# Patient Record
Sex: Female | Born: 1935 | Race: White | Hispanic: No | Smoking: Never smoker
Health system: Southern US, Community
[De-identification: ages and names within clinical notes are randomized; demographics above are authoritative.]

## PROBLEM LIST (undated history)

## (undated) DIAGNOSIS — I10 Essential (primary) hypertension: Secondary | ICD-10-CM

## (undated) DIAGNOSIS — E785 Hyperlipidemia, unspecified: Secondary | ICD-10-CM

## (undated) DIAGNOSIS — Z8719 Personal history of other diseases of the digestive system: Secondary | ICD-10-CM

## (undated) DIAGNOSIS — F419 Anxiety disorder, unspecified: Secondary | ICD-10-CM

## (undated) DIAGNOSIS — C801 Malignant (primary) neoplasm, unspecified: Secondary | ICD-10-CM

## (undated) HISTORY — DX: Essential (primary) hypertension: I10

## (undated) HISTORY — PX: TOOTH EXTRACTION: SUR596

## (undated) HISTORY — DX: Malignant (primary) neoplasm, unspecified: C80.1

## (undated) HISTORY — DX: Hyperlipidemia, unspecified: E78.5

## (undated) HISTORY — PX: BREAST BIOPSY: SHX20

---

## 1998-10-15 ENCOUNTER — Other Ambulatory Visit: Admission: RE | Admit: 1998-10-15 | Discharge: 1998-10-15 | Payer: Self-pay | Admitting: Family Medicine

## 1999-09-13 ENCOUNTER — Inpatient Hospital Stay (HOSPITAL_COMMUNITY): Admission: EM | Admit: 1999-09-13 | Discharge: 1999-09-17 | Payer: Self-pay | Admitting: Family Medicine

## 1999-09-14 ENCOUNTER — Encounter: Payer: Self-pay | Admitting: Family Medicine

## 2000-02-27 ENCOUNTER — Other Ambulatory Visit: Admission: RE | Admit: 2000-02-27 | Discharge: 2000-02-27 | Payer: Self-pay | Admitting: Family Medicine

## 2001-03-29 ENCOUNTER — Other Ambulatory Visit: Admission: RE | Admit: 2001-03-29 | Discharge: 2001-03-29 | Payer: Self-pay | Admitting: Family Medicine

## 2002-07-17 ENCOUNTER — Other Ambulatory Visit: Admission: RE | Admit: 2002-07-17 | Discharge: 2002-07-17 | Payer: Self-pay | Admitting: Family Medicine

## 2005-02-19 ENCOUNTER — Ambulatory Visit (HOSPITAL_COMMUNITY): Admission: RE | Admit: 2005-02-19 | Discharge: 2005-02-19 | Payer: Self-pay | Admitting: Gastroenterology

## 2006-08-19 ENCOUNTER — Other Ambulatory Visit: Admission: RE | Admit: 2006-08-19 | Discharge: 2006-08-19 | Payer: Self-pay | Admitting: Family Medicine

## 2010-01-07 ENCOUNTER — Encounter: Admission: RE | Admit: 2010-01-07 | Discharge: 2010-01-07 | Payer: Self-pay | Admitting: Family Medicine

## 2010-01-27 ENCOUNTER — Encounter: Admission: RE | Admit: 2010-01-27 | Discharge: 2010-01-27 | Payer: Self-pay | Admitting: Surgery

## 2010-01-29 ENCOUNTER — Ambulatory Visit (HOSPITAL_BASED_OUTPATIENT_CLINIC_OR_DEPARTMENT_OTHER): Admission: RE | Admit: 2010-01-29 | Discharge: 2010-01-29 | Payer: Self-pay | Admitting: Surgery

## 2010-01-29 HISTORY — PX: BREAST LUMPECTOMY: SHX2

## 2010-02-03 ENCOUNTER — Ambulatory Visit: Payer: Self-pay | Admitting: Hematology & Oncology

## 2010-02-14 LAB — CBC WITH DIFFERENTIAL (CANCER CENTER ONLY)
BASO#: 0.1 10*3/uL (ref 0.0–0.2)
BASO%: 1 % (ref 0.0–2.0)
EOS%: 2.1 % (ref 0.0–7.0)
Eosinophils Absolute: 0.2 10*3/uL (ref 0.0–0.5)
HCT: 46 % (ref 34.8–46.6)
HGB: 15.3 g/dL (ref 11.6–15.9)
LYMPH#: 2.8 10*3/uL (ref 0.9–3.3)
LYMPH%: 27.1 % (ref 14.0–48.0)
MCH: 29.5 pg (ref 26.0–34.0)
MCHC: 33.2 g/dL (ref 32.0–36.0)
MCV: 89 fL (ref 81–101)
MONO#: 0.4 10*3/uL (ref 0.1–0.9)
MONO%: 3.9 % (ref 0.0–13.0)
NEUT#: 6.8 10*3/uL — ABNORMAL HIGH (ref 1.5–6.5)
NEUT%: 65.9 % (ref 39.6–80.0)
Platelets: 296 10*3/uL (ref 145–400)
RBC: 5.17 10*6/uL (ref 3.70–5.32)
RDW: 11.9 % (ref 10.5–14.6)
WBC: 10.3 10*3/uL — ABNORMAL HIGH (ref 3.9–10.0)

## 2010-02-14 LAB — COMPREHENSIVE METABOLIC PANEL
ALT: 11 U/L (ref 0–35)
AST: 23 U/L (ref 0–37)
Albumin: 4.6 g/dL (ref 3.5–5.2)
Alkaline Phosphatase: 70 U/L (ref 39–117)
BUN: 11 mg/dL (ref 6–23)
CO2: 26 mEq/L (ref 19–32)
Calcium: 10.1 mg/dL (ref 8.4–10.5)
Chloride: 103 mEq/L (ref 96–112)
Creatinine, Ser: 0.79 mg/dL (ref 0.40–1.20)
Glucose, Bld: 140 mg/dL — ABNORMAL HIGH (ref 70–99)
Potassium: 3.5 mEq/L (ref 3.5–5.3)
Sodium: 141 mEq/L (ref 135–145)
Total Bilirubin: 0.4 mg/dL (ref 0.3–1.2)
Total Protein: 7 g/dL (ref 6.0–8.3)

## 2010-02-14 LAB — VITAMIN D 25 HYDROXY (VIT D DEFICIENCY, FRACTURES): Vit D, 25-Hydroxy: 64 ng/mL (ref 30–89)

## 2010-02-19 ENCOUNTER — Encounter: Admission: RE | Admit: 2010-02-19 | Discharge: 2010-02-19 | Payer: Self-pay | Admitting: Hematology & Oncology

## 2010-03-12 ENCOUNTER — Ambulatory Visit
Admission: RE | Admit: 2010-03-12 | Discharge: 2010-04-23 | Payer: Self-pay | Source: Home / Self Care | Attending: Radiation Oncology | Admitting: Radiation Oncology

## 2010-04-11 ENCOUNTER — Ambulatory Visit: Payer: Self-pay | Admitting: Hematology & Oncology

## 2010-04-14 LAB — COMPREHENSIVE METABOLIC PANEL
ALT: 13 U/L (ref 0–35)
AST: 23 U/L (ref 0–37)
Albumin: 4.7 g/dL (ref 3.5–5.2)
Alkaline Phosphatase: 65 U/L (ref 39–117)
BUN: 15 mg/dL (ref 6–23)
CO2: 29 mEq/L (ref 19–32)
Calcium: 9.9 mg/dL (ref 8.4–10.5)
Chloride: 102 mEq/L (ref 96–112)
Creatinine, Ser: 0.75 mg/dL (ref 0.40–1.20)
Glucose, Bld: 116 mg/dL — ABNORMAL HIGH (ref 70–99)
Potassium: 3.9 mEq/L (ref 3.5–5.3)
Sodium: 142 mEq/L (ref 135–145)
Total Bilirubin: 0.4 mg/dL (ref 0.3–1.2)
Total Protein: 7.1 g/dL (ref 6.0–8.3)

## 2010-04-14 LAB — CBC WITH DIFFERENTIAL (CANCER CENTER ONLY)
BASO#: 0.1 10*3/uL (ref 0.0–0.2)
BASO%: 0.8 % (ref 0.0–2.0)
EOS%: 1.8 % (ref 0.0–7.0)
Eosinophils Absolute: 0.1 10*3/uL (ref 0.0–0.5)
HCT: 44.7 % (ref 34.8–46.6)
HGB: 14.9 g/dL (ref 11.6–15.9)
LYMPH#: 1.3 10*3/uL (ref 0.9–3.3)
LYMPH%: 17.2 % (ref 14.0–48.0)
MCH: 29.4 pg (ref 26.0–34.0)
MCHC: 33.3 g/dL (ref 32.0–36.0)
MCV: 88 fL (ref 81–101)
MONO#: 0.5 10*3/uL (ref 0.1–0.9)
MONO%: 6.3 % (ref 0.0–13.0)
NEUT#: 5.5 10*3/uL (ref 1.5–6.5)
NEUT%: 73.9 % (ref 39.6–80.0)
Platelets: 234 10*3/uL (ref 145–400)
RBC: 5.06 10*6/uL (ref 3.70–5.32)
RDW: 11.4 % (ref 10.5–14.6)
WBC: 7.5 10*3/uL (ref 3.9–10.0)

## 2010-05-16 ENCOUNTER — Ambulatory Visit: Payer: Self-pay | Admitting: Hematology & Oncology

## 2010-07-03 ENCOUNTER — Encounter (HOSPITAL_BASED_OUTPATIENT_CLINIC_OR_DEPARTMENT_OTHER): Payer: Medicare Other | Admitting: Hematology & Oncology

## 2010-07-03 ENCOUNTER — Other Ambulatory Visit: Payer: Self-pay | Admitting: Hematology & Oncology

## 2010-07-03 DIAGNOSIS — Z170 Estrogen receptor positive status [ER+]: Secondary | ICD-10-CM

## 2010-07-03 DIAGNOSIS — I10 Essential (primary) hypertension: Secondary | ICD-10-CM

## 2010-07-03 DIAGNOSIS — C50419 Malignant neoplasm of upper-outer quadrant of unspecified female breast: Secondary | ICD-10-CM

## 2010-07-03 LAB — COMPREHENSIVE METABOLIC PANEL
ALT: 10 U/L (ref 0–35)
AST: 21 U/L (ref 0–37)
Albumin: 4.5 g/dL (ref 3.5–5.2)
Alkaline Phosphatase: 71 U/L (ref 39–117)
BUN: 19 mg/dL (ref 6–23)
CO2: 28 mEq/L (ref 19–32)
Calcium: 10 mg/dL (ref 8.4–10.5)
Chloride: 101 mEq/L (ref 96–112)
Creatinine, Ser: 0.84 mg/dL (ref 0.40–1.20)
Glucose, Bld: 125 mg/dL — ABNORMAL HIGH (ref 70–99)
Potassium: 3.7 mEq/L (ref 3.5–5.3)
Sodium: 142 mEq/L (ref 135–145)
Total Bilirubin: 0.4 mg/dL (ref 0.3–1.2)
Total Protein: 7.1 g/dL (ref 6.0–8.3)

## 2010-07-03 LAB — CBC WITH DIFFERENTIAL (CANCER CENTER ONLY)
BASO#: 0 10*3/uL (ref 0.0–0.2)
BASO%: 0.6 % (ref 0.0–2.0)
EOS%: 1.1 % (ref 0.0–7.0)
Eosinophils Absolute: 0.1 10*3/uL (ref 0.0–0.5)
HCT: 44.6 % (ref 34.8–46.6)
HGB: 15.2 g/dL (ref 11.6–15.9)
LYMPH#: 1.3 10*3/uL (ref 0.9–3.3)
LYMPH%: 21.4 % (ref 14.0–48.0)
MCH: 29.9 pg (ref 26.0–34.0)
MCHC: 34 g/dL (ref 32.0–36.0)
MCV: 88 fL (ref 81–101)
MONO#: 0.4 10*3/uL (ref 0.1–0.9)
MONO%: 6 % (ref 0.0–13.0)
NEUT#: 4.4 10*3/uL (ref 1.5–6.5)
NEUT%: 70.9 % (ref 39.6–80.0)
Platelets: 237 10*3/uL (ref 145–400)
RBC: 5.07 10*6/uL (ref 3.70–5.32)
RDW: 11.4 % (ref 10.5–14.6)
WBC: 6.3 10*3/uL (ref 3.9–10.0)

## 2010-07-10 ENCOUNTER — Other Ambulatory Visit: Payer: Self-pay | Admitting: Dermatology

## 2010-07-24 LAB — COMPREHENSIVE METABOLIC PANEL
ALT: 13 U/L (ref 0–35)
AST: 26 U/L (ref 0–37)
Albumin: 4.2 g/dL (ref 3.5–5.2)
Alkaline Phosphatase: 66 U/L (ref 39–117)
BUN: 9 mg/dL (ref 6–23)
CO2: 28 mEq/L (ref 19–32)
Calcium: 9.4 mg/dL (ref 8.4–10.5)
Chloride: 107 mEq/L (ref 96–112)
Creatinine, Ser: 0.74 mg/dL (ref 0.4–1.2)
GFR calc Af Amer: >60 mL/min (ref >60)
GFR calc non Af Amer: >60 mL/min (ref >60)
Glucose, Bld: 114 mg/dL — ABNORMAL HIGH (ref 70–99)
Potassium: 3.9 mEq/L (ref 3.5–5.1)
Sodium: 141 mEq/L (ref 135–145)
Total Bilirubin: 0.7 mg/dL (ref 0.3–1.2)
Total Protein: 7.4 g/dL (ref 6.0–8.3)

## 2010-07-24 LAB — CBC
HCT: 46.2 % — ABNORMAL HIGH (ref 36.0–46.0)
Hemoglobin: 15.9 g/dL — ABNORMAL HIGH (ref 12.0–15.0)
MCH: 30.5 pg (ref 26.0–34.0)
MCHC: 34.4 g/dL (ref 30.0–36.0)
MCV: 88.7 fL (ref 78.0–100.0)
Platelets: 240 10*3/uL (ref 150–400)
RBC: 5.21 MIL/uL — ABNORMAL HIGH (ref 3.87–5.11)
RDW: 12.8 % (ref 11.5–15.5)
WBC: 8.4 10*3/uL (ref 4.0–10.5)

## 2010-07-24 LAB — URINE MICROSCOPIC-ADD ON

## 2010-07-24 LAB — DIFFERENTIAL
Basophils Absolute: 0.1 10*3/uL (ref 0.0–0.1)
Basophils Relative: 1 % (ref 0–1)
Eosinophils Absolute: 0.1 10*3/uL (ref 0.0–0.7)
Eosinophils Relative: 2 % (ref 0–5)
Lymphocytes Relative: 27 % (ref 12–46)
Lymphs Abs: 2.3 10*3/uL (ref 0.7–4.0)
Monocytes Absolute: 0.6 10*3/uL (ref 0.1–1.0)
Monocytes Relative: 7 % (ref 3–12)
Neutro Abs: 5.4 10*3/uL (ref 1.7–7.7)
Neutrophils Relative %: 64 % (ref 43–77)

## 2010-07-24 LAB — URINALYSIS, ROUTINE W REFLEX MICROSCOPIC
Bilirubin Urine: NEGATIVE
Glucose, UA: NEGATIVE mg/dL
Hgb urine dipstick: NEGATIVE
Ketones, ur: NEGATIVE mg/dL
Nitrite: NEGATIVE
Protein, ur: NEGATIVE mg/dL
Specific Gravity, Urine: 1.009 (ref 1.005–1.030)
Urobilinogen, UA: 0.2 mg/dL (ref 0.0–1.0)
pH: 7 (ref 5.0–8.0)

## 2010-07-31 ENCOUNTER — Encounter (HOSPITAL_BASED_OUTPATIENT_CLINIC_OR_DEPARTMENT_OTHER): Payer: Medicare Other | Admitting: Hematology & Oncology

## 2010-07-31 DIAGNOSIS — C50419 Malignant neoplasm of upper-outer quadrant of unspecified female breast: Secondary | ICD-10-CM

## 2010-07-31 DIAGNOSIS — Z170 Estrogen receptor positive status [ER+]: Secondary | ICD-10-CM

## 2010-09-08 ENCOUNTER — Encounter (INDEPENDENT_AMBULATORY_CARE_PROVIDER_SITE_OTHER): Payer: Self-pay | Admitting: Surgery

## 2010-09-25 ENCOUNTER — Other Ambulatory Visit: Payer: Self-pay | Admitting: Hematology & Oncology

## 2010-09-25 ENCOUNTER — Encounter (HOSPITAL_BASED_OUTPATIENT_CLINIC_OR_DEPARTMENT_OTHER): Payer: Medicare Other | Admitting: Hematology & Oncology

## 2010-09-25 DIAGNOSIS — Z170 Estrogen receptor positive status [ER+]: Secondary | ICD-10-CM

## 2010-09-25 DIAGNOSIS — C50419 Malignant neoplasm of upper-outer quadrant of unspecified female breast: Secondary | ICD-10-CM

## 2010-09-25 DIAGNOSIS — E559 Vitamin D deficiency, unspecified: Secondary | ICD-10-CM

## 2010-09-25 LAB — CBC WITH DIFFERENTIAL (CANCER CENTER ONLY)
BASO#: 0 10*3/uL (ref 0.0–0.2)
BASO%: 0.4 % (ref 0.0–2.0)
EOS%: 1.2 % (ref 0.0–7.0)
Eosinophils Absolute: 0.1 10*3/uL (ref 0.0–0.5)
HCT: 42 % (ref 34.8–46.6)
HGB: 14.3 g/dL (ref 11.6–15.9)
LYMPH#: 1.9 10*3/uL (ref 0.9–3.3)
LYMPH%: 27.6 % (ref 14.0–48.0)
MCH: 29.4 pg (ref 26.0–34.0)
MCHC: 34 g/dL (ref 32.0–36.0)
MCV: 86 fL (ref 81–101)
MONO#: 0.5 10*3/uL (ref 0.1–0.9)
MONO%: 7.4 % (ref 0.0–13.0)
NEUT#: 4.3 10*3/uL (ref 1.5–6.5)
NEUT%: 63.4 % (ref 39.6–80.0)
Platelets: 219 10*3/uL (ref 145–400)
RBC: 4.87 10*6/uL (ref 3.70–5.32)
RDW: 13 % (ref 11.1–15.7)
WBC: 6.8 10*3/uL (ref 3.9–10.0)

## 2010-09-26 LAB — COMPREHENSIVE METABOLIC PANEL
ALT: 10 U/L (ref 0–35)
AST: 23 U/L (ref 0–37)
Albumin: 4.2 g/dL (ref 3.5–5.2)
Alkaline Phosphatase: 57 U/L (ref 39–117)
BUN: 13 mg/dL (ref 6–23)
CO2: 25 mEq/L (ref 19–32)
Calcium: 9.5 mg/dL (ref 8.4–10.5)
Chloride: 104 mEq/L (ref 96–112)
Creatinine, Ser: 0.78 mg/dL (ref 0.40–1.20)
Glucose, Bld: 141 mg/dL — ABNORMAL HIGH (ref 70–99)
Potassium: 3.4 mEq/L — ABNORMAL LOW (ref 3.5–5.3)
Sodium: 141 mEq/L (ref 135–145)
Total Bilirubin: 0.4 mg/dL (ref 0.3–1.2)
Total Protein: 6.8 g/dL (ref 6.0–8.3)

## 2010-09-26 LAB — VITAMIN D 25 HYDROXY (VIT D DEFICIENCY, FRACTURES): Vit D, 25-Hydroxy: 58 ng/mL (ref 30–89)

## 2010-09-26 NOTE — Discharge Summary (Signed)
[place]. [facility]  Patient:    Nancy Bell, Nancy Bell                       MRN: [id number] Attending:  Otilio Connors. Gerri Spore, M.D.                           Discharge Summary  TRANSFER NOTE  Ms. Blasingame is being transferred to the step-down unit in order to better monitor her fluids and cardiovascular status. As of this morning, her blood pressure has dropped into the 80s/50s although she is still alert and oriented, not complaining of shortness of breath or chest pain. Her morning EKG does show some nonspecific ST-T wave changes which could be consistent with some cardiac ischemia. Also, her urine output, as recorded, was only 150 cc through the night; and it is necessary for her to have closer monitoring to be able to follow her fluid status. We will also check cardiac enzymes and re-evaluate her placement in the morning. DD:  09/14/99 TD:  09/14/99 Job: [id number]

## 2010-09-26 NOTE — H&P (Signed)
[place]. [facility]  Patient:    Nancy Bell, Nancy Bell                       MRN: [id number] Attending:  Otilio Connors. Gerri Spore, M.D.                         History and Physical  CHIEF COMPLAINT: High fever, chills, diarrhea, severe malaise for a day.  HISTORY OF PRESENT ILLNESS: This 77 year old white female was well until approximately five days prior to admission when she developed a tender abscess the size of a golf ball on his skin on the left upper quadrant.  She was seen at [facility] and had it incised and drained and then was put on Augmentin.  She seemed to do well until three days prior to admission when she started to develop a slight amount of diarrhea.  The day prior to admission the diarrhea became worse and the day of admission the diarrhea was even worse, so that she stopped the Augmentin the day of admission.  During the day of admission she was well enough initially to go to a farmers market but then developed fever and chills, and was unable to continue walking.  She went home, where she experienced ongoing worsening chills and joint aches, with nausea and anorexia.  She continued to have diarrhea, having 10-12 liquid stools per day.  She presented to the urgent care clinic at [facility] and was evaluated there, found to have a high WBC of 16,900, and no definite source for her fever, with a fever of 103 degrees.  A urinalysis at that time was normal.  She denied at any time any headache, neck stiffness or soreness, any confusion, or other neurologic symptoms.  She denied any shortness of breath, chest pain, recent cough or congestion, denied any urinary symptoms, any abdominal pain initially, no vaginal discharge, and had not seen any blood in her stool.  She had not had any swelling of her extremities.  Just prior to coming going to the urgent care clinic, however, she did have a small amount of right lower quadrant pain  which she refers to as a "twinge" and was found to be slightly tender in that area when seen in the walk-in clinic.  PAST MEDICAL HISTORY: Negative for any significant illness.  She has never been hospitalized for anything except childbirth.  She does have some osteopenia for which she takes Lamictal and she takes one aspirin a day.  She has had some white coat hypertension but it is normal at home.  PAST SURGICAL HISTORY: She has never had any surgeries.  SOCIAL HISTORY: [place] is active.  [place]  She has dogs that come in the house and she could be exposed to ticks but did not have any known tick bites.  She does not smoke and does not drink alcohol.  Her husband recently had open heart surgery and has been very ill with lymphoma.  FAMILY HISTORY: Negative for any significant illness except for cancer, but not for colon or breast cancer.  REVIEW OF SYSTEMS: Negative for any visual symptoms or other neurologic complaints.  She does complain of a dry mouth.  She denies any shortness of breath, chest pain, palpitations, or syncope.  She denies any change in bowel habits prior to the onset of the diarrhea or any blood in her stool.  She  denies any rash.  PHYSICAL EXAMINATION:  VITAL SIGNS: Temperature 102 degrees, heart rate 112, respirations 76, blood pressure 144/85.  Weight is 122 pounds.  GENERAL: She is a tired-appearing but alert and normal appearing slim elderly female.  HEENT: Sclerae anicteric.  Conjunctivae pink.  PERRL.  TMs are without effusions.  Oropharynx shows dry mucous membranes.  NECK: Supple without adenopathy or thyromegaly.  LUNGS: Clear.  CARDIAC: Regular rate and rhythm, tachycardic.  No murmur heard.  BREAST: Without masses, nontender.  ABDOMEN: Soft, slightly tender to deep palpation in the right lower quadrant but no guarding or rebound, and no masses.  She has a healing wound which is dry and is not  draining, and has no more inflammation in the left upper quadrant just below the ribs.  She has no inguinal adenopathy.  EXTREMITIES: Her lower extremities are without any rash, clubbing, cyanosis, or edema.  NEUROLOGIC: Cranial nerves 2-12 intact.  Motor and sensory grossly intact, nonfocal.  LABORATORY DATA: Laboratories from the walk-in clinic show a WBC of 16,900, hemoglobin normal at 14.5 and hematocrit normal at 42.8; platelets normal at 253,000.  Urinalysis negative except for ketones.  ASSESSMENT/PLAN:  1. Acute onset high fever prostration, leukocytosis.  Rule out sepsis.  At     this point no source for her fever.  Plan is for cultures of urine, blood,     and stool, chest x-ray, abdominal CT to rule out abdominal abscess or     appendicitis.  Will also check a [facility] Spotted Fever titer and     start on IV antibiotics, broad spectrum, with coverage also for [facility] Spotted Fever.  2. Dehydration, diarrhea.  Give IV fluids, follow electrolytes carefully,     culture stool, and check for Clostridium difficile toxin as well. DD:  09/13/99 TD:  09/14/99 Job: [id number]

## 2010-09-26 NOTE — Op Note (Signed)
Nancy Bell, RITACCO NO.:  [id number]   MEDICAL RECORD NO.:  [id number]          PATIENT TYPE:  AMB   LOCATION:  ENDO                         FACILITY:  [facility]   PHYSICIAN:  Bernette Redbird, M.D.   DATE OF BIRTH:  17-Jan-1925   DATE OF PROCEDURE:  02/19/2005  DATE OF DISCHARGE:                                 OPERATIVE REPORT   PROCEDURE:  Colonoscopy.   INDICATION:  Initial screening colonoscopy in an asymptomatic 77 year old  female.   FINDINGS:  Normal exam.   DESCRIPTION OF PROCEDURE:  The nature, purpose, and risks of the procedure  had been reviewed with the patient both through our open access program and  also immediately prior to the procedure by myself and she provided written  consent. Sedation was fentanyl 50 mcg and Versed 5 mg IV without arrhythmias  or desaturation. The Olympus adjustable tension pediatric video colonoscope  was advanced to the terminal ileum which had a normal appearance and  pullback was then performed. The appendiceal orifice was readily identified  as well. The quality of prep was excellent and it is felt that all areas  were well seen.   This was a normal examination. No polyps, cancer, colitis, vascular  malformations or diverticulosis were noted. Retroflexion in the rectum was  normal. No biopsies were obtained. The patient tolerated the procedure well  and there were no apparent complications.   IMPRESSION:  Normal screening colonoscopy.   PLAN:  The interval to the patient's next colonoscopy would depend on her  family history. There is a possible family history of colon polyps in her  sister, in which case a repeat colonoscopy in 5 years would probably be  clinically appropriate, presuming the patient remains in good general  medical health in the interim. If there is no family history of colonic  adenomas, since this exam was negative and the patient is 77 years all and  would next be due for a screening colonoscopy  at age 77 (10 years from now),  realistically further routine colonoscopic screening would not be needed.  From talking with the patient today, apparently it is still not clear if  this recently-diagnosed tumor on her sisters colon was neoplastic or not,  so depending on how those detail shape up over time, we will make our  decision accordingly.           ______________________________  Bernette Redbird, M.D.     RB/MEDQ  D:  02/19/2005  T:  02/19/2005  Job:  [id number]   cc:   Jethro Bastos, M.D.  Fax: [phone]

## 2010-12-22 ENCOUNTER — Ambulatory Visit
Admission: RE | Admit: 2010-12-22 | Discharge: 2010-12-22 | Disposition: A | Discharge status: Home / Self Care | Payer: Medicare Other | Source: Ambulatory Visit | Attending: Radiation Oncology | Admitting: Radiation Oncology

## 2011-01-23 ENCOUNTER — Encounter (INDEPENDENT_AMBULATORY_CARE_PROVIDER_SITE_OTHER): Payer: Self-pay | Admitting: General Surgery

## 2011-01-23 DIAGNOSIS — Z853 Personal history of malignant neoplasm of breast: Secondary | ICD-10-CM | POA: Insufficient documentation

## 2011-01-25 ENCOUNTER — Encounter (INDEPENDENT_AMBULATORY_CARE_PROVIDER_SITE_OTHER): Payer: Self-pay | Admitting: Surgery

## 2011-01-27 ENCOUNTER — Ambulatory Visit (INDEPENDENT_AMBULATORY_CARE_PROVIDER_SITE_OTHER): Payer: Medicare Other | Admitting: Surgery

## 2011-01-27 ENCOUNTER — Encounter (INDEPENDENT_AMBULATORY_CARE_PROVIDER_SITE_OTHER): Payer: Self-pay | Admitting: Surgery

## 2011-01-27 DIAGNOSIS — Z853 Personal history of malignant neoplasm of breast: Secondary | ICD-10-CM

## 2011-01-27 NOTE — Progress Notes (Signed)
NAME: Nancy Bell       DOB: April 19, 1925           DATE: 01/27/2011       MRN: [id number]   Nancy Bell is a 77 y.o.Marland Kitchenfemale who presents for routine followup of her Right breast cancerdiagnosed in 2011 and treated with lumpectomy and radiation. She was not able to tolerate anti-estrogen therapy. She has no problems or concerns on either side.  PFSH: She has had no significant changes since the last visit here.  ROS: There have been no significant changes since the last visit here  EXAM: General: The patient is alert, oriented, generally healty appearing, NAD. Mood and affect are normal.  Breasts:  The right breast status post lumpectomy with radiation therapy. There are minimal radiation changes. Polypectomy site itself this fine and there is no thickening. This doesn't suggest a recurrence. There's no other modality in that breast in the left breast remains normal to inspection and palpation  Lymphatics: She has no axillary or supraclavicular adenopathy on either side.  Extremities: Full ROM of the surgical side with no lymphedema noted.  Data Reviewed: Recent mammogram report was noted. Also look over the oncology notes.  Impression: Doing well, with no evidence of recurrent cancer or new cancer  Plan: Will continue to follow up on an annual basis here.

## 2011-01-28 ENCOUNTER — Other Ambulatory Visit: Payer: Self-pay | Admitting: Hematology & Oncology

## 2011-01-28 ENCOUNTER — Encounter (HOSPITAL_BASED_OUTPATIENT_CLINIC_OR_DEPARTMENT_OTHER): Payer: Medicare Other | Admitting: Hematology & Oncology

## 2011-01-28 DIAGNOSIS — Z170 Estrogen receptor positive status [ER+]: Secondary | ICD-10-CM

## 2011-01-28 DIAGNOSIS — C50419 Malignant neoplasm of upper-outer quadrant of unspecified female breast: Secondary | ICD-10-CM

## 2011-01-28 DIAGNOSIS — E559 Vitamin D deficiency, unspecified: Secondary | ICD-10-CM

## 2011-01-28 LAB — CBC WITH DIFFERENTIAL (CANCER CENTER ONLY)
BASO#: 0 10*3/uL (ref 0.0–0.2)
BASO%: 0.4 % (ref 0.0–2.0)
EOS%: 3.1 % (ref 0.0–7.0)
Eosinophils Absolute: 0.2 10*3/uL (ref 0.0–0.5)
HCT: 42 % (ref 34.8–46.6)
HGB: 14.7 g/dL (ref 11.6–15.9)
LYMPH#: 1.8 10*3/uL (ref 0.9–3.3)
LYMPH%: 24.4 % (ref 14.0–48.0)
MCH: 30.1 pg (ref 26.0–34.0)
MCHC: 35 g/dL (ref 32.0–36.0)
MCV: 86 fL (ref 81–101)
MONO#: 0.7 10*3/uL (ref 0.1–0.9)
MONO%: 9.3 % (ref 0.0–13.0)
NEUT#: 4.7 10*3/uL (ref 1.5–6.5)
NEUT%: 62.8 % (ref 39.6–80.0)
Platelets: 234 10*3/uL (ref 145–400)
RBC: 4.89 10*6/uL (ref 3.70–5.32)
RDW: 12.6 % (ref 11.1–15.7)
WBC: 7.4 10*3/uL (ref 3.9–10.0)

## 2011-01-29 LAB — COMPREHENSIVE METABOLIC PANEL
ALT: 14 U/L (ref 0–35)
AST: 26 U/L (ref 0–37)
Albumin: 4.2 g/dL (ref 3.5–5.2)
Alkaline Phosphatase: 78 U/L (ref 39–117)
BUN: 13 mg/dL (ref 6–23)
CO2: 29 mEq/L (ref 19–32)
Calcium: 9.6 mg/dL (ref 8.4–10.5)
Chloride: 101 mEq/L (ref 96–112)
Creatinine, Ser: 0.77 mg/dL (ref 0.50–1.10)
Glucose, Bld: 130 mg/dL — ABNORMAL HIGH (ref 70–99)
Potassium: 3.5 mEq/L (ref 3.5–5.3)
Sodium: 142 mEq/L (ref 135–145)
Total Bilirubin: 0.5 mg/dL (ref 0.3–1.2)
Total Protein: 6.6 g/dL (ref 6.0–8.3)

## 2011-01-29 LAB — VITAMIN D 25 HYDROXY (VIT D DEFICIENCY, FRACTURES): Vit D, 25-Hydroxy: 60 ng/mL (ref 30–89)

## 2011-01-29 LAB — LACTATE DEHYDROGENASE: LDH: 164 U/L (ref 94–250)

## 2011-03-11 ENCOUNTER — Encounter (INDEPENDENT_AMBULATORY_CARE_PROVIDER_SITE_OTHER): Payer: Self-pay

## 2011-06-03 ENCOUNTER — Ambulatory Visit (HOSPITAL_BASED_OUTPATIENT_CLINIC_OR_DEPARTMENT_OTHER): Payer: Medicare Other | Admitting: Hematology & Oncology

## 2011-06-03 ENCOUNTER — Other Ambulatory Visit (HOSPITAL_BASED_OUTPATIENT_CLINIC_OR_DEPARTMENT_OTHER): Payer: Medicare Other | Admitting: Lab

## 2011-06-03 VITALS — BP 141/77 | HR 82 | Temp 98.2°F | Ht 61.0 in | Wt 115.0 lb

## 2011-06-03 DIAGNOSIS — Z170 Estrogen receptor positive status [ER+]: Secondary | ICD-10-CM

## 2011-06-03 DIAGNOSIS — E559 Vitamin D deficiency, unspecified: Secondary | ICD-10-CM | POA: Diagnosis not present

## 2011-06-03 DIAGNOSIS — C50119 Malignant neoplasm of central portion of unspecified female breast: Secondary | ICD-10-CM | POA: Diagnosis not present

## 2011-06-03 DIAGNOSIS — C50919 Malignant neoplasm of unspecified site of unspecified female breast: Secondary | ICD-10-CM

## 2011-06-03 DIAGNOSIS — N6089 Other benign mammary dysplasias of unspecified breast: Secondary | ICD-10-CM | POA: Diagnosis not present

## 2011-06-03 DIAGNOSIS — C50419 Malignant neoplasm of upper-outer quadrant of unspecified female breast: Secondary | ICD-10-CM | POA: Diagnosis not present

## 2011-06-03 LAB — CBC WITH DIFFERENTIAL (CANCER CENTER ONLY)
BASO#: 0 10*3/uL (ref 0.0–0.2)
BASO%: 0.4 % (ref 0.0–2.0)
EOS%: 2.7 % (ref 0.0–7.0)
Eosinophils Absolute: 0.2 10*3/uL (ref 0.0–0.5)
HCT: 43.2 % (ref 34.8–46.6)
HGB: 14.5 g/dL (ref 11.6–15.9)
LYMPH#: 1.8 10*3/uL (ref 0.9–3.3)
LYMPH%: 26.2 % (ref 14.0–48.0)
MCH: 29.2 pg (ref 26.0–34.0)
MCHC: 33.6 g/dL (ref 32.0–36.0)
MCV: 87 fL (ref 81–101)
MONO#: 0.5 10*3/uL (ref 0.1–0.9)
MONO%: 7.2 % (ref 0.0–13.0)
NEUT#: 4.3 10*3/uL (ref 1.5–6.5)
NEUT%: 63.5 % (ref 39.6–80.0)
Platelets: 218 10*3/uL (ref 145–400)
RBC: 4.96 10*6/uL (ref 3.70–5.32)
RDW: 13 % (ref 11.1–15.7)
WBC: 6.8 10*3/uL (ref 3.9–10.0)

## 2011-06-03 NOTE — Progress Notes (Signed)
This office note has been dictated.

## 2011-06-03 NOTE — Progress Notes (Signed)
CC:   Bailey Mech, MD Currie Paris, M.D.  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  CURRENT THERAPY:  Observation.  INTERIM HISTORY:  Ms. Weesner comes in for followup.  She is doing well. We last saw her back in September.  She got through the holidays without any difficulties.  She did have a tough time back in the fall.  Her sister had died during the summer.  Thankfully, Ms. Garramone's family are all very strong and a very close family.  She has had no problems with arthritis or myalgias.  She has had no change in bowel or bladder habits.  She has had no cough or shortness of breath.  She is intolerant to aromatase inhibitors.  We also tried her on Fareston, with which she did not do well at all.  She has had no fever.  There has been no bleeding.  She has had no rashes.  PHYSICAL EXAMINATION:  General:  This is a well-developed, well- nourished white female in no obvious distress.  Vital Signs:  98.2, pulse 82, respiratory rate 14, blood pressure 141/77.  Weight is 115. Head and Neck Exam:  Shows a normocephalic, atraumatic skull.  There are no ocular or oral lesions.  There are no palpable cervical or supraclavicular lymph nodes.  Thyroid is not palpable.  Lungs:  Clear bilaterally.  Cardiac Exam:  Regular rate and rhythm with normal S1 and S2.  There are no murmurs, rubs or bruits.  Abdominal Exam:  Soft with good bowel sounds.  There is no palpable abdominal mass.  There is no fluid wave.  There is no palpable hepatosplenomegaly.  She does have a sebaceous cyst in the left upper quadrant.  Breast Exam:  Shows the left breast with no masses, edema or erythema.  There is no left axillary adenopathy.  Right breast shows a well-healed lumpectomy at the 3 o'clock position.  There is a slight contraction of the right breast. There is no right axillary adenopathy.  No distinct mass is noted in the right breast.   Back Exam:  Shows no kyphosis nor osteoporotic changes. There is no tenderness over the spine, ribs or hips.  Extremities:  Show no clubbing, cyanosis, or edema.  She has some age-related osteoarthritic changes in her joints.  Neurological Exam:  Shows no focal neurological deficits.  She does have a little bit of a tremor.  LABORATORY STUDIES:  White cell count 6.8, hemoglobin 14.5, hematocrit 43.2, platelet count 218.  IMPRESSION:  Ms. Brouhard is an 78 year old white female with stage I infiltrating ductal carcinoma of the right breast.  She underwent lumpectomy, I think, back in September 2011.  For now, I do not see any problems with recurrent disease.  We will get her back in 4 months.  I think if all is good in 4 months, then we can hopefully get her back every 6 months after that.    ______________________________ Josph Macho, M.D. PRE/MEDQ  D:  06/03/2011  T:  06/03/2011  Job:  [id number]

## 2011-06-04 LAB — COMPREHENSIVE METABOLIC PANEL
ALT: 12 U/L (ref 0–35)
AST: 23 U/L (ref 0–37)
Albumin: 4.4 g/dL (ref 3.5–5.2)
Alkaline Phosphatase: 64 U/L (ref 39–117)
BUN: 17 mg/dL (ref 6–23)
CO2: 27 mEq/L (ref 19–32)
Calcium: 9.7 mg/dL (ref 8.4–10.5)
Chloride: 101 mEq/L (ref 96–112)
Creatinine, Ser: 0.77 mg/dL (ref 0.50–1.10)
Glucose, Bld: 109 mg/dL — ABNORMAL HIGH (ref 70–99)
Potassium: 3.6 mEq/L (ref 3.5–5.3)
Sodium: 139 mEq/L (ref 135–145)
Total Bilirubin: 0.5 mg/dL (ref 0.3–1.2)
Total Protein: 6.5 g/dL (ref 6.0–8.3)

## 2011-06-04 LAB — VITAMIN D 25 HYDROXY (VIT D DEFICIENCY, FRACTURES): Vit D, 25-Hydroxy: 65 ng/mL (ref 30–89)

## 2011-06-22 ENCOUNTER — Telehealth: Payer: Self-pay | Admitting: *Deleted

## 2011-06-22 NOTE — Telephone Encounter (Signed)
Message copied by Anselm Jungling on Mon Jun 22, 2011  2:39 PM ------      Message from: Arlan Organ R      Created: Thu Jun 18, 2011  6:51 PM       Labs are great!! pete

## 2011-06-22 NOTE — Telephone Encounter (Signed)
Called patient to let her know that her labwork was good per dr. ennever 

## 2011-07-27 ENCOUNTER — Other Ambulatory Visit: Payer: Self-pay | Admitting: Dermatology

## 2011-07-27 DIAGNOSIS — Z85828 Personal history of other malignant neoplasm of skin: Secondary | ICD-10-CM | POA: Diagnosis not present

## 2011-07-27 DIAGNOSIS — L821 Other seborrheic keratosis: Secondary | ICD-10-CM | POA: Diagnosis not present

## 2011-07-27 DIAGNOSIS — D239 Other benign neoplasm of skin, unspecified: Secondary | ICD-10-CM | POA: Diagnosis not present

## 2011-07-27 DIAGNOSIS — C44529 Squamous cell carcinoma of skin of other part of trunk: Secondary | ICD-10-CM | POA: Diagnosis not present

## 2011-07-27 DIAGNOSIS — L578 Other skin changes due to chronic exposure to nonionizing radiation: Secondary | ICD-10-CM | POA: Diagnosis not present

## 2011-07-27 DIAGNOSIS — C44721 Squamous cell carcinoma of skin of unspecified lower limb, including hip: Secondary | ICD-10-CM | POA: Diagnosis not present

## 2011-07-27 DIAGNOSIS — L570 Actinic keratosis: Secondary | ICD-10-CM | POA: Diagnosis not present

## 2011-07-27 DIAGNOSIS — D0460 Carcinoma in situ of skin of unspecified upper limb, including shoulder: Secondary | ICD-10-CM | POA: Diagnosis not present

## 2011-08-24 DIAGNOSIS — C44621 Squamous cell carcinoma of skin of unspecified upper limb, including shoulder: Secondary | ICD-10-CM | POA: Diagnosis not present

## 2011-08-24 DIAGNOSIS — C44721 Squamous cell carcinoma of skin of unspecified lower limb, including hip: Secondary | ICD-10-CM | POA: Diagnosis not present

## 2011-08-24 DIAGNOSIS — L570 Actinic keratosis: Secondary | ICD-10-CM | POA: Diagnosis not present

## 2011-09-30 ENCOUNTER — Other Ambulatory Visit (HOSPITAL_BASED_OUTPATIENT_CLINIC_OR_DEPARTMENT_OTHER): Payer: Medicare Other | Admitting: Lab

## 2011-09-30 ENCOUNTER — Ambulatory Visit (HOSPITAL_BASED_OUTPATIENT_CLINIC_OR_DEPARTMENT_OTHER): Payer: Medicare Other | Admitting: Hematology & Oncology

## 2011-09-30 VITALS — BP 155/70 | HR 91 | Temp 97.4°F | Ht 61.0 in | Wt 114.0 lb

## 2011-09-30 DIAGNOSIS — M818 Other osteoporosis without current pathological fracture: Secondary | ICD-10-CM

## 2011-09-30 DIAGNOSIS — C50219 Malignant neoplasm of upper-inner quadrant of unspecified female breast: Secondary | ICD-10-CM

## 2011-09-30 DIAGNOSIS — Z170 Estrogen receptor positive status [ER+]: Secondary | ICD-10-CM

## 2011-09-30 DIAGNOSIS — C50919 Malignant neoplasm of unspecified site of unspecified female breast: Secondary | ICD-10-CM

## 2011-09-30 DIAGNOSIS — T386X5A Adverse effect of antigonadotrophins, antiestrogens, antiandrogens, not elsewhere classified, initial encounter: Secondary | ICD-10-CM

## 2011-09-30 LAB — COMPREHENSIVE METABOLIC PANEL
ALT: 10 U/L (ref 0–35)
AST: 23 U/L (ref 0–37)
Albumin: 4.5 g/dL (ref 3.5–5.2)
Alkaline Phosphatase: 53 U/L (ref 39–117)
BUN: 18 mg/dL (ref 6–23)
CO2: 28 mEq/L (ref 19–32)
Calcium: 10 mg/dL (ref 8.4–10.5)
Chloride: 103 mEq/L (ref 96–112)
Creatinine, Ser: 0.83 mg/dL (ref 0.50–1.10)
Glucose, Bld: 110 mg/dL — ABNORMAL HIGH (ref 70–99)
Potassium: 3.4 mEq/L — ABNORMAL LOW (ref 3.5–5.3)
Sodium: 142 mEq/L (ref 135–145)
Total Bilirubin: 0.5 mg/dL (ref 0.3–1.2)
Total Protein: 6.7 g/dL (ref 6.0–8.3)

## 2011-09-30 LAB — CBC WITH DIFFERENTIAL (CANCER CENTER ONLY)
BASO#: 0 10*3/uL (ref 0.0–0.2)
BASO%: 0.4 % (ref 0.0–2.0)
EOS%: 1.9 % (ref 0.0–7.0)
Eosinophils Absolute: 0.1 10*3/uL (ref 0.0–0.5)
HCT: 43.6 % (ref 34.8–46.6)
HGB: 14.7 g/dL (ref 11.6–15.9)
LYMPH#: 2.1 10*3/uL (ref 0.9–3.3)
LYMPH%: 28.3 % (ref 14.0–48.0)
MCH: 29.8 pg (ref 26.0–34.0)
MCHC: 33.7 g/dL (ref 32.0–36.0)
MCV: 88 fL (ref 81–101)
MONO#: 0.6 10*3/uL (ref 0.1–0.9)
MONO%: 7.6 % (ref 0.0–13.0)
NEUT#: 4.5 10*3/uL (ref 1.5–6.5)
NEUT%: 61.8 % (ref 39.6–80.0)
Platelets: 228 10*3/uL (ref 145–400)
RBC: 4.93 10*6/uL (ref 3.70–5.32)
RDW: 13 % (ref 11.1–15.7)
WBC: 7.2 10*3/uL (ref 3.9–10.0)

## 2011-09-30 NOTE — Progress Notes (Signed)
This office note has been dictated.

## 2011-09-30 NOTE — Progress Notes (Signed)
CC:   Bailey Mech, MD Currie Paris, M.D.  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  CURRENT THERAPY:  Observation.  INTERIM HISTORY:  Ms. Loughridge comes in for her followup.  We see her every 4-6 months.  She is doing quite well.  She had no complaints since we last saw her.  It has now been almost 2 years since she had her lumpectomy.  She is not tolerant of aromatase inhibitors or tamoxifen.  She has had no bony pain.  She has had no change in bowel or bladder habits.  She is taking vitamin D twice a day.  Her last vitamin D level was 65 back in January.  She has had no fever, sweats or chills.  She has had no bleeding or bruising.  There has been no nausea, vomiting.  PHYSICAL EXAMINATION:  This is a well-developed well-nourished white female in no obvious distress.  Vital signs:  Temperature of 97.4, pulse 91, respiratory rate 18, blood pressure 155/70.  Weight is 114.  Head and neck:  Normocephalic, atraumatic skull.  There are no ocular or oral lesions.  There are no palpable cervical or supraclavicular lymph nodes. Lungs:  Clear to percussion and auscultation bilaterally.  Cardiac: Regular rate and rhythm with normal S1 and S2.  There are no murmurs, rubs or bruits.  Abdomen:  Soft with good bowel sounds.  There is no palpable abdominal mass.  There is no palpable hepatosplenomegaly. Breasts:  Left breast with no masses, edema or erythema.  There is no left axillary adenopathy.  Right breast shows well-healed lumpectomy. This is at the 3 o'clock position.  There is some slight contraction of the lumpectomy site.  There is no obvious mass in the right breast. There is no right axillary adenopathy.  Extremities:  Shows no clubbing, cyanosis or edema.  Neurologic:  No focal neurological deficits.  Skin: No rashes, ecchymosis or petechia.  She does have some actinic keratoses.  She has a couple seborrheic keratosis.   Neurologic:  Shows no focal neurological deficits.  LABORATORY STUDIES:  White cell count is 7.2, hemoglobin 14.7, hematocrit 43.6, platelet count 228.  IMPRESSION:  Ms. Kreutzer is very nice 78 year old white female with stage IB ductal carcinoma of the right breast.  She underwent lumpectomy.  She is not tolerant of aromatase inhibitors or tamoxifen.  I think that we are going to be okay not to have her on any kind of adjuvant hormonal therapy.  I think her risk of recurrence is less than 10%.  We will plan to get her back after labor day now.  We will plan for 4 months.  Once she gets through this year, then we will go every 6 months.    ______________________________ Josph Macho, M.D. PRE/MEDQ  D:  09/30/2011  T:  09/30/2011  Job:  [id number]

## 2011-10-01 ENCOUNTER — Telehealth: Payer: Self-pay | Admitting: *Deleted

## 2011-10-01 NOTE — Telephone Encounter (Signed)
Called patient to let her know that her labwork looked wonderful per dr. Myna Hidalgo

## 2011-10-01 NOTE — Telephone Encounter (Signed)
Message copied by Anselm Jungling on Thu Oct 01, 2011 10:18 AM ------      Message from: Arlan Organ R      Created: Thu Oct 01, 2011  7:25 AM       Please call the patient couldn't tell her that her labs look wonderful. Cindee Lame

## 2011-10-21 DIAGNOSIS — M549 Dorsalgia, unspecified: Secondary | ICD-10-CM | POA: Diagnosis not present

## 2011-11-20 ENCOUNTER — Encounter (INDEPENDENT_AMBULATORY_CARE_PROVIDER_SITE_OTHER): Payer: Self-pay | Admitting: Surgery

## 2012-01-07 DIAGNOSIS — Z853 Personal history of malignant neoplasm of breast: Secondary | ICD-10-CM | POA: Diagnosis not present

## 2012-01-20 DIAGNOSIS — M549 Dorsalgia, unspecified: Secondary | ICD-10-CM | POA: Diagnosis not present

## 2012-01-27 DIAGNOSIS — M549 Dorsalgia, unspecified: Secondary | ICD-10-CM | POA: Diagnosis not present

## 2012-01-27 DIAGNOSIS — Z1331 Encounter for screening for depression: Secondary | ICD-10-CM | POA: Diagnosis not present

## 2012-01-27 DIAGNOSIS — Z23 Encounter for immunization: Secondary | ICD-10-CM | POA: Diagnosis not present

## 2012-01-27 DIAGNOSIS — I10 Essential (primary) hypertension: Secondary | ICD-10-CM | POA: Diagnosis not present

## 2012-01-28 ENCOUNTER — Other Ambulatory Visit (HOSPITAL_BASED_OUTPATIENT_CLINIC_OR_DEPARTMENT_OTHER): Payer: Medicare Other | Admitting: Lab

## 2012-01-28 ENCOUNTER — Ambulatory Visit (HOSPITAL_BASED_OUTPATIENT_CLINIC_OR_DEPARTMENT_OTHER): Payer: Medicare Other | Admitting: Hematology & Oncology

## 2012-01-28 VITALS — BP 150/69 | HR 85 | Temp 97.4°F | Resp 18 | Ht 61.0 in | Wt 118.0 lb

## 2012-01-28 DIAGNOSIS — M818 Other osteoporosis without current pathological fracture: Secondary | ICD-10-CM | POA: Diagnosis not present

## 2012-01-28 DIAGNOSIS — C50319 Malignant neoplasm of lower-inner quadrant of unspecified female breast: Secondary | ICD-10-CM | POA: Diagnosis not present

## 2012-01-28 DIAGNOSIS — C50219 Malignant neoplasm of upper-inner quadrant of unspecified female breast: Secondary | ICD-10-CM | POA: Diagnosis not present

## 2012-01-28 DIAGNOSIS — C50919 Malignant neoplasm of unspecified site of unspecified female breast: Secondary | ICD-10-CM

## 2012-01-28 DIAGNOSIS — M810 Age-related osteoporosis without current pathological fracture: Secondary | ICD-10-CM | POA: Diagnosis not present

## 2012-01-28 DIAGNOSIS — T38805A Adverse effect of unspecified hormones and synthetic substitutes, initial encounter: Secondary | ICD-10-CM | POA: Diagnosis not present

## 2012-01-28 DIAGNOSIS — E559 Vitamin D deficiency, unspecified: Secondary | ICD-10-CM | POA: Diagnosis not present

## 2012-01-28 LAB — CBC WITH DIFFERENTIAL (CANCER CENTER ONLY)
BASO#: 0 10*3/uL (ref 0.0–0.2)
BASO%: 0.4 % (ref 0.0–2.0)
EOS%: 3.5 % (ref 0.0–7.0)
Eosinophils Absolute: 0.3 10*3/uL (ref 0.0–0.5)
HCT: 43.6 % (ref 34.8–46.6)
HGB: 14.9 g/dL (ref 11.6–15.9)
LYMPH#: 1.7 10*3/uL (ref 0.9–3.3)
LYMPH%: 24.7 % (ref 14.0–48.0)
MCH: 30 pg (ref 26.0–34.0)
MCHC: 34.2 g/dL (ref 32.0–36.0)
MCV: 88 fL (ref 81–101)
MONO#: 0.6 10*3/uL (ref 0.1–0.9)
MONO%: 7.9 % (ref 0.0–13.0)
NEUT#: 4.5 10*3/uL (ref 1.5–6.5)
NEUT%: 63.5 % (ref 39.6–80.0)
Platelets: 219 10*3/uL (ref 145–400)
RBC: 4.97 10*6/uL (ref 3.70–5.32)
RDW: 12.9 % (ref 11.1–15.7)
WBC: 7.1 10*3/uL (ref 3.9–10.0)

## 2012-01-28 NOTE — Progress Notes (Signed)
This office note has been dictated.

## 2012-01-28 NOTE — Patient Instructions (Signed)
Call if problems 

## 2012-01-29 LAB — COMPREHENSIVE METABOLIC PANEL
ALT: 14 U/L (ref 0–35)
AST: 24 U/L (ref 0–37)
Albumin: 4.4 g/dL (ref 3.5–5.2)
Alkaline Phosphatase: 59 U/L (ref 39–117)
BUN: 19 mg/dL (ref 6–23)
CO2: 30 mEq/L (ref 19–32)
Calcium: 9.6 mg/dL (ref 8.4–10.5)
Chloride: 103 mEq/L (ref 96–112)
Creatinine, Ser: 0.75 mg/dL (ref 0.50–1.10)
Glucose, Bld: 105 mg/dL — ABNORMAL HIGH (ref 70–99)
Potassium: 3.3 mEq/L — ABNORMAL LOW (ref 3.5–5.3)
Sodium: 141 mEq/L (ref 135–145)
Total Bilirubin: 0.6 mg/dL (ref 0.3–1.2)
Total Protein: 6.9 g/dL (ref 6.0–8.3)

## 2012-01-29 LAB — VITAMIN D 25 HYDROXY (VIT D DEFICIENCY, FRACTURES): Vit D, 25-Hydroxy: 53 ng/mL (ref 30–89)

## 2012-01-29 NOTE — Progress Notes (Signed)
CC:   Dibas Koirala, MD Currie Paris, M.D.  DIAGNOSIS:  Stage I (T1b N0 M0) ductal carcinoma of the right breast.  CURRENT THERAPY:  Observation.  INTERIM HISTORY:  Ms. Santori comes for followup.  We see her every 4 months.  She is doing quite well.  She is having some back issues.  She has had these before.  She sees her family doctor.  He gave her some muscle relaxers and some Vicodin.  She says that this is getting better. She did not need an x-ray.  She does have osteoporosis.  She is not on aromatase inhibitors.  She cannot tolerate these.  She cannot tolerate tamoxifen.  She is on vitamin D.  She has had no problems with bowels or bladder.  There is no cough or shortness of breath.  She has had no fevers, sweats or chills.  I think she is due for a mammogram soon.  PHYSICAL EXAMINATION:  This is a petite, elderly white female in no obvious distress.  Vital signs:  9.74, pulse 85, respiratory rate 18, blood pressure 150/69.  Weight is 118.  Head and neck:  Normocephalic, atraumatic skull.  There are no ocular or oral lesions.  There are no palpable cervical or supraclavicular lymph nodes.  Lungs:  Clear bilaterally.  There are no rales, wheezes or rhonchi.  Cardiac:  Regular rate and rhythm with normal S1, S2.  There are no murmurs, rubs or bruits.  Breasts:  Left breast with no masses, edema or erythema.  There is no left axillary adenopathy.  Right breast shows well-healed lumpectomy at the 3 o'clock position.  There is some slight contraction and firmness at the lumpectomy site.  No distinct mass is noted in the right breast.  There is no right axillary adenopathy.  Back:  No tenderness over the spine, ribs, or hips.  There may be some slight spasms in the lumbosacral, sacroiliac region.  Extremities:  No clubbing, cyanosis or edema.  She has good range of motion of her joints.  Neurologic:  No focal neurological deficits.  LABORATORY STUDIES:  White cell count  is 7.1, hemoglobin 15, hematocrit 44, platelet count 219.  IMPRESSION:  Ms. Zwilling is an 78 year old white female with stage IB breast cancer.  She underwent a lumpectomy.  She had this done back in September 2011.  We did try her on aromatase inhibitors and tamoxifen, which she did not do too well with.  Given the small size of the tumor, and her age, I really felt that we could just get away without having to do any hormonal therapy.  We will plan to get her back in 4 more months.  After that, we should be able to make it every 6 months.    ______________________________ Josph Macho, M.D. PRE/MEDQ  D:  01/28/2012  T:  01/29/2012  Job:  [id number]

## 2012-02-01 ENCOUNTER — Ambulatory Visit (INDEPENDENT_AMBULATORY_CARE_PROVIDER_SITE_OTHER): Payer: Medicare Other | Admitting: Surgery

## 2012-02-01 ENCOUNTER — Encounter (INDEPENDENT_AMBULATORY_CARE_PROVIDER_SITE_OTHER): Payer: Self-pay | Admitting: Surgery

## 2012-02-01 VITALS — BP 160/88 | HR 84 | Temp 98.4°F | Resp 18 | Ht 61.0 in | Wt 116.6 lb

## 2012-02-01 DIAGNOSIS — Z853 Personal history of malignant neoplasm of breast: Secondary | ICD-10-CM | POA: Diagnosis not present

## 2012-02-01 NOTE — Patient Instructions (Signed)
Continue annual mammograms and follow ups 

## 2012-02-01 NOTE — Progress Notes (Signed)
NAME: [name]       DOB: Jul 19, 1924           DATE: 02/01/2012       MRN: [id number]   Nancy Bell is a 78 y.o.Marland Kitchenfemale who presents for routine followup of her Right breast cancer, IDC, receptor + diagnosed in 2011 and treated with lumpectomy and radiation. She was not able to tolerate anti-estrogen therapy. She has no problems or concerns on either side.  PFSH: She has had no significant changes since the last visit here.  ROS: There have been no significant changes since the last visit here  EXAM: General: The patient is alert, oriented, generally healty appearing, NAD. Mood and affect are normal.  Breasts:  The right breast status post lumpectomy with radiation therapy. There are minimal radiation changes. Lumpectomysite itself this fine and there is no thickening.  There's no other abnormalityin the right breast and the left breast remains normal to inspection and palpation  Lymphatics: She has no axillary or supraclavicular adenopathy on either side.  Extremities: Full ROM of the surgical side with no lymphedema noted.  Data Reviewed: Recent mammogram report was noted.  Done at [facility]  Impression: Doing well, with no evidence of recurrent cancer or new cancer  Plan: Will continue to follow up on an annual basis here.

## 2012-02-29 ENCOUNTER — Telehealth: Payer: Self-pay | Admitting: Hematology & Oncology

## 2012-02-29 NOTE — Telephone Encounter (Addendum)
Message copied by Cathi Roan on Mon Feb 29, 2012  9:43 AM ------      Message from: [place], [place] N      Created: Fri Jan 29, 2012 10:19 AM                   ----- Message -----         From: Josph Macho, MD         Sent: 01/29/2012   7:29 AM           To: Nanci Pina Nurse Hp            Please call and let her her potassium is a little low. To her to eat more bananas and tomatoes and raisins. Also make sure that she continues to take her vitamin D. Thanks. Cindee Lame  02-29-12   9:40am-  Called and spoke to Pt regarding above MD message, Pt stated she is taking her Vit D and will now eat one whole banana vs 1/2 as she was doing, she will call us back if any questions.  Lupita Raider LPN

## 2012-04-20 DIAGNOSIS — I10 Essential (primary) hypertension: Secondary | ICD-10-CM | POA: Diagnosis not present

## 2012-04-20 DIAGNOSIS — F411 Generalized anxiety disorder: Secondary | ICD-10-CM | POA: Diagnosis not present

## 2012-05-26 ENCOUNTER — Encounter (INDEPENDENT_AMBULATORY_CARE_PROVIDER_SITE_OTHER): Payer: Self-pay

## 2012-06-28 ENCOUNTER — Telehealth: Payer: Self-pay | Admitting: Hematology & Oncology

## 2012-06-28 NOTE — Telephone Encounter (Signed)
Per MD to cx 07/26/12 apt and resch.  Apt was resch for 08/02/12.  i called and left message on voice mail apt date/time.

## 2012-07-26 ENCOUNTER — Ambulatory Visit: Payer: Medicare Other | Admitting: Hematology & Oncology

## 2012-07-26 ENCOUNTER — Other Ambulatory Visit: Payer: Medicare Other | Admitting: Lab

## 2012-08-01 DIAGNOSIS — L723 Sebaceous cyst: Secondary | ICD-10-CM | POA: Diagnosis not present

## 2012-08-01 DIAGNOSIS — D239 Other benign neoplasm of skin, unspecified: Secondary | ICD-10-CM | POA: Diagnosis not present

## 2012-08-01 DIAGNOSIS — L821 Other seborrheic keratosis: Secondary | ICD-10-CM | POA: Diagnosis not present

## 2012-08-01 DIAGNOSIS — D1801 Hemangioma of skin and subcutaneous tissue: Secondary | ICD-10-CM | POA: Diagnosis not present

## 2012-08-01 DIAGNOSIS — L819 Disorder of pigmentation, unspecified: Secondary | ICD-10-CM | POA: Diagnosis not present

## 2012-08-01 DIAGNOSIS — Z85828 Personal history of other malignant neoplasm of skin: Secondary | ICD-10-CM | POA: Diagnosis not present

## 2012-08-01 DIAGNOSIS — D2370 Other benign neoplasm of skin of unspecified lower limb, including hip: Secondary | ICD-10-CM | POA: Diagnosis not present

## 2012-08-02 ENCOUNTER — Ambulatory Visit (HOSPITAL_BASED_OUTPATIENT_CLINIC_OR_DEPARTMENT_OTHER): Payer: Medicare Other | Admitting: Hematology & Oncology

## 2012-08-02 ENCOUNTER — Other Ambulatory Visit (HOSPITAL_BASED_OUTPATIENT_CLINIC_OR_DEPARTMENT_OTHER): Payer: Medicare Other | Admitting: Lab

## 2012-08-02 VITALS — BP 134/50 | HR 87 | Temp 98.2°F | Resp 16 | Ht 61.0 in | Wt 118.0 lb

## 2012-08-02 DIAGNOSIS — M810 Age-related osteoporosis without current pathological fracture: Secondary | ICD-10-CM

## 2012-08-02 DIAGNOSIS — C50919 Malignant neoplasm of unspecified site of unspecified female breast: Secondary | ICD-10-CM

## 2012-08-02 DIAGNOSIS — Z853 Personal history of malignant neoplasm of breast: Secondary | ICD-10-CM | POA: Diagnosis not present

## 2012-08-02 DIAGNOSIS — E559 Vitamin D deficiency, unspecified: Secondary | ICD-10-CM | POA: Diagnosis not present

## 2012-08-02 LAB — CBC WITH DIFFERENTIAL (CANCER CENTER ONLY)
BASO#: 0 10*3/uL (ref 0.0–0.2)
BASO%: 0.4 % (ref 0.0–2.0)
EOS%: 1.2 % (ref 0.0–7.0)
Eosinophils Absolute: 0.1 10*3/uL (ref 0.0–0.5)
HCT: 44.1 % (ref 34.8–46.6)
HGB: 14.8 g/dL (ref 11.6–15.9)
LYMPH#: 1.6 10*3/uL (ref 0.9–3.3)
LYMPH%: 23.7 % (ref 14.0–48.0)
MCH: 29.2 pg (ref 26.0–34.0)
MCHC: 33.6 g/dL (ref 32.0–36.0)
MCV: 87 fL (ref 81–101)
MONO#: 0.5 10*3/uL (ref 0.1–0.9)
MONO%: 6.9 % (ref 0.0–13.0)
NEUT#: 4.6 10*3/uL (ref 1.5–6.5)
NEUT%: 67.8 % (ref 39.6–80.0)
Platelets: 229 10*3/uL (ref 145–400)
RBC: 5.07 10*6/uL (ref 3.70–5.32)
RDW: 13.3 % (ref 11.1–15.7)
WBC: 6.8 10*3/uL (ref 3.9–10.0)

## 2012-08-02 NOTE — Progress Notes (Signed)
This office note has been dictated.

## 2012-08-03 LAB — COMPREHENSIVE METABOLIC PANEL
ALT: 12 U/L (ref 0–35)
AST: 22 U/L (ref 0–37)
Albumin: 4.5 g/dL (ref 3.5–5.2)
Alkaline Phosphatase: 62 U/L (ref 39–117)
BUN: 15 mg/dL (ref 6–23)
CO2: 27 mEq/L (ref 19–32)
Calcium: 9.8 mg/dL (ref 8.4–10.5)
Chloride: 101 mEq/L (ref 96–112)
Creatinine, Ser: 0.74 mg/dL (ref 0.50–1.10)
Glucose, Bld: 126 mg/dL — ABNORMAL HIGH (ref 70–99)
Potassium: 4 mEq/L (ref 3.5–5.3)
Sodium: 141 mEq/L (ref 135–145)
Total Bilirubin: 0.5 mg/dL (ref 0.3–1.2)
Total Protein: 6.8 g/dL (ref 6.0–8.3)

## 2012-08-03 LAB — VITAMIN D 25 HYDROXY (VIT D DEFICIENCY, FRACTURES): Vit D, 25-Hydroxy: 63 ng/mL (ref 30–89)
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
## 2012-08-04 ENCOUNTER — Telehealth: Payer: Self-pay | Admitting: Oncology

## 2012-08-04 NOTE — Telephone Encounter (Addendum)
Message copied by Lacie Draft on Thu Aug 04, 2012  8:35 AM ------      Message from: Arlan Organ R      Created: Wed Aug 03, 2012  8:10 PM       Call - labs and vit D are ok!!  Cindee Lame ------Spoke with patient regarding labs.

## 2013-01-10 DIAGNOSIS — Z853 Personal history of malignant neoplasm of breast: Secondary | ICD-10-CM | POA: Diagnosis not present

## 2013-02-02 ENCOUNTER — Ambulatory Visit (HOSPITAL_BASED_OUTPATIENT_CLINIC_OR_DEPARTMENT_OTHER): Payer: Medicare Other | Admitting: Hematology & Oncology

## 2013-02-02 ENCOUNTER — Other Ambulatory Visit (HOSPITAL_BASED_OUTPATIENT_CLINIC_OR_DEPARTMENT_OTHER): Payer: Medicare Other | Admitting: Lab

## 2013-02-02 ENCOUNTER — Telehealth: Payer: Self-pay | Admitting: Hematology & Oncology

## 2013-02-02 VITALS — BP 145/60 | HR 80 | Temp 98.2°F | Resp 14 | Ht 61.0 in | Wt 119.0 lb

## 2013-02-02 DIAGNOSIS — Z853 Personal history of malignant neoplasm of breast: Secondary | ICD-10-CM

## 2013-02-02 DIAGNOSIS — C50919 Malignant neoplasm of unspecified site of unspecified female breast: Secondary | ICD-10-CM

## 2013-02-02 LAB — CBC WITH DIFFERENTIAL (CANCER CENTER ONLY)
BASO#: 0 10*3/uL (ref 0.0–0.2)
BASO%: 0.2 % (ref 0.0–2.0)
EOS%: 1.1 % (ref 0.0–7.0)
Eosinophils Absolute: 0.1 10*3/uL (ref 0.0–0.5)
HCT: 45.5 % (ref 34.8–46.6)
HGB: 15.3 g/dL (ref 11.6–15.9)
LYMPH#: 1.8 10*3/uL (ref 0.9–3.3)
LYMPH%: 22.3 % (ref 14.0–48.0)
MCH: 29.7 pg (ref 26.0–34.0)
MCHC: 33.6 g/dL (ref 32.0–36.0)
MCV: 88 fL (ref 81–101)
MONO#: 0.6 10*3/uL (ref 0.1–0.9)
MONO%: 7.3 % (ref 0.0–13.0)
NEUT#: 5.6 10*3/uL (ref 1.5–6.5)
NEUT%: 69.1 % (ref 39.6–80.0)
Platelets: 234 10*3/uL (ref 145–400)
RBC: 5.16 10*6/uL (ref 3.70–5.32)
RDW: 13.3 % (ref 11.1–15.7)
WBC: 8.1 10*3/uL (ref 3.9–10.0)

## 2013-02-02 LAB — COMPREHENSIVE METABOLIC PANEL
ALT: 13 U/L (ref 0–35)
AST: 26 U/L (ref 0–37)
Albumin: 4.6 g/dL (ref 3.5–5.2)
Alkaline Phosphatase: 67 U/L (ref 39–117)
BUN: 12 mg/dL (ref 6–23)
CO2: 33 mEq/L — ABNORMAL HIGH (ref 19–32)
Calcium: 10.4 mg/dL (ref 8.4–10.5)
Chloride: 100 mEq/L (ref 96–112)
Creatinine, Ser: 0.86 mg/dL (ref 0.50–1.10)
Glucose, Bld: 117 mg/dL — ABNORMAL HIGH (ref 70–99)
Potassium: 3.7 mEq/L (ref 3.5–5.3)
Sodium: 143 mEq/L (ref 135–145)
Total Bilirubin: 0.6 mg/dL (ref 0.3–1.2)
Total Protein: 7.2 g/dL (ref 6.0–8.3)

## 2013-02-02 NOTE — Telephone Encounter (Signed)
Mailed march schedule °

## 2013-02-02 NOTE — Progress Notes (Signed)
This office note has been dictated.

## 2013-02-03 ENCOUNTER — Telehealth: Payer: Self-pay | Admitting: *Deleted

## 2013-02-03 ENCOUNTER — Ambulatory Visit (INDEPENDENT_AMBULATORY_CARE_PROVIDER_SITE_OTHER): Payer: Medicare Other | Admitting: Surgery

## 2013-02-03 ENCOUNTER — Encounter (INDEPENDENT_AMBULATORY_CARE_PROVIDER_SITE_OTHER): Payer: Self-pay | Admitting: Surgery

## 2013-02-03 VITALS — BP 140/90 | HR 80 | Temp 98.9°F | Resp 14 | Ht 61.0 in | Wt 120.8 lb

## 2013-02-03 DIAGNOSIS — Z853 Personal history of malignant neoplasm of breast: Secondary | ICD-10-CM | POA: Diagnosis not present

## 2013-02-03 NOTE — Telephone Encounter (Signed)
Called patient to let her know that her labwork all looked good per dr. Myna Hidalgo left message on personal answering machine

## 2013-02-03 NOTE — Patient Instructions (Signed)
Continue annual mammograms and follow ups 

## 2013-02-03 NOTE — Progress Notes (Signed)
NAME: Nancy Bell       DOB: Sep 11, 1924           DATE: 02/03/2013       MRN: [id number]   Nancy Bell is a 79 y.o.Marland Kitchenfemale who presents for routine followup of her Right breast cancer, IDC, receptor + diagnosed in 2011 and treated with lumpectomy and radiation. She was not able to tolerate anti-estrogen therapy. She has no problems or concerns on either side.  PFSH: She has had no significant changes since the last visit here.  ROS: There have been no significant changes since the last visit here  EXAM: General: The patient is alert, oriented, generally healty appearing, NAD. Mood and affect are normal.  Breasts:  The right breast status post lumpectomy with radiation therapy. There are minimal radiation changes. Lumpectomysite itself this fine and there is no thickening.  There's no other abnormality in the right breast and the left breast remains normal to inspection and palpation  Lymphatics: She has no axillary or supraclavicular adenopathy on either side.  Extremities: Full ROM of the surgical side with no lymphedema noted.  Data Reviewed: Mammogram at [facility] in August, reportedly normal  Impression: Doing well, with no evidence of recurrent cancer or new cancer  Plan: Will continue to follow up on an annual basis here.

## 2013-02-03 NOTE — Telephone Encounter (Signed)
Message copied by Anselm Jungling on Fri Feb 03, 2013 10:00 AM ------      Message from: Josph Macho      Created: Fri Feb 03, 2013  7:41 AM       Please call and let her no her labs look good. Thanks. Pete ------

## 2013-02-07 NOTE — Progress Notes (Signed)
CC:   Dibas Koirala, MD Currie Paris, M.D.  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  CURRENT THERAPY:  Observation.  INTERIM HISTORY:  Ms. Hurtado comes in for followup.  She is doing fairly well.  She had no problems since we last saw her.  She has had a pretty decent summer.  She is helping with one of her grand kids.  She has had no problems with bony pain.  There is no problem with bowels or bladder.  She has had no fevers, sweats, or chills.  She has had no cough.  She has had no headache.  There has been no change in her medications.  When we last saw her in March, her vitamin D was 56.  Her last mammogram was done in couple years ago.  She has had no problems with leg swelling.  PHYSICAL EXAMINATION:  General:  This is a petite, elderly white female in no obvious distress.  Vital signs:  Temperature of 98.2, pulse 80, respiratory 14, blood pressure 145/60.  Weight is 119 pounds.  Head and neck:  Normocephalic, atraumatic skull.  There are no ocular or oral lesions.  There are no palpable cervical or supraclavicular lymph nodes. Lungs:  Clear bilaterally.  Cardiac:  Regular rate and rhythm with a normal S1, S2.  There are no murmurs, rubs or bruits.  Abdomen:  Soft. She has good bowel sounds.  There is no fluid wave.  There is no palpable hepatosplenomegaly.  Breasts:  Shows left breast with no masses, edema or erythema.  There is no left axillary adenopathy.  Right breast shows well-healed lumpectomy at about the 3 o'clock position. She has some slight contraction of the right breast.  No distinct masses noted in the right breast.  There is no right axillary adenopathy. Back:  Shows some slight kyphosis.  She has no tenderness over the spine, ribs, or hips.  Extremities:  Show some osteoarthritic changes in her joints.  She has good range motion of her joints.  She has good strength in her upper and lower extremities.   Skin:  Shows no suspicious hyperpigmented lesions.  LABORATORY STUDIES:  White cell count is 8.1, hemoglobin 15.3, hematocrit 45.5, platelet count 234.  IMPRESSION:  Ms. Wortley is an 79 year old white female with stage I infiltrating ductal carcinoma of the right breast.  She was diagnosed back in September 2011.  She did not require any radiation therapy.  We tried her on an aromatase inhibitor.  She did not tolerate this well. We have tried her on tamoxifen.  Again, she did not tolerate this well.  As such, we are just following her along.  I think her risk of recurrence is easily less than 10%.  We will plan to get her back in 6 months' time.  I do not see any need for lab work or x-rays in between visits.    ______________________________ Josph Macho, M.D. PRE/MEDQ  D:  02/02/2013  T:  02/07/2013  Job:  [id number]

## 2013-02-08 DIAGNOSIS — Z23 Encounter for immunization: Secondary | ICD-10-CM | POA: Diagnosis not present

## 2013-04-12 DIAGNOSIS — I10 Essential (primary) hypertension: Secondary | ICD-10-CM | POA: Diagnosis not present

## 2013-04-12 DIAGNOSIS — E7800 Pure hypercholesterolemia, unspecified: Secondary | ICD-10-CM | POA: Diagnosis not present

## 2013-04-12 DIAGNOSIS — M810 Age-related osteoporosis without current pathological fracture: Secondary | ICD-10-CM | POA: Diagnosis not present

## 2013-04-12 DIAGNOSIS — R7309 Other abnormal glucose: Secondary | ICD-10-CM | POA: Diagnosis not present

## 2013-04-12 DIAGNOSIS — F411 Generalized anxiety disorder: Secondary | ICD-10-CM | POA: Diagnosis not present

## 2013-04-12 DIAGNOSIS — M5380 Other specified dorsopathies, site unspecified: Secondary | ICD-10-CM | POA: Diagnosis not present

## 2013-04-17 NOTE — Progress Notes (Signed)
CC:   Dibas Koirala, MD Currie Paris, M.D.  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  CURRENT THERAPY:  Observation.  INTERIM HISTORY:  Ms. Hizer comes in for her followup.  She is doing fairly well.  She had no problems since I last saw her.  She has had a pretty decent summer.  She is helping with one of her grand kids.  She has had no problems with bony pain.  There are no problem with bowels or bladder.  She has had no fevers, sweats, or chills.  She has had no cough.  She has had no headache.  There has been no change in her medications.  When we last saw her in March, her vitamin D was 41.  Her last mammogram was done a couple of years ago.  She has had no problems with leg swelling.  PHYSICAL EXAMINATION:  General:  This is a petite, elderly white female in no obvious distress.  Vital Signs:  Temperature of 98.2, pulse 80, respiratory rate 14, blood pressure 145/60, weight is 119 pounds.  Head and Neck:  Normocephalic, atraumatic skull.  There are no ocular or oral lesions.  There are no palpable cervical or supraclavicular lymph nodes. Lungs:  Clear bilaterally.  Cardiac:  Regular rate and rhythm with a normal S1, S2.  There are no murmurs, rubs, or bruits.  Abdomen:  Soft. She has good bowel sounds.  There is no fluid wave.  There is no palpable hepatosplenomegaly.  Breasts:  Shows left breast with no masses, edema, or erythema.  There is no left axillary adenopathy. Right breast shows well-healed lumpectomy at about the 3 o'clock position.  She has some slight contraction of the right breast.  No distinct masses noted in the right breast.  There is no right axillary adenopathy.  Back:  Shows some slight kyphosis.  She has no tenderness over the spine, ribs, or hips.  Extremities:  Show some osteoarthritic changes in her joints.  She has good range of motion of her joints.  She has good strength in her upper and lower  extremities.  Skin:  Shows no suspicious hyperpigmented lesions.  LABORATORY STUDIES:  White cell count is 8.1, hemoglobin 15.3, hematocrit 45.5, platelet count 234.  IMPRESSION:  Ms. Mcbane is an 79 year old white female with stage I infiltrating ductal carcinoma of the right breast.  She was diagnosed back in September 2011.  She did not require any radiation therapy.  We tried her on an aromatase inhibitor.  She did not tolerate this well. We have tried her on tamoxifen.  Again, she did not tolerate this well.  As such, we are just following her along.  I think her risk of recurrence is easily less than 10%.  We will plan to get her back in 6 months' time.  I do not see any need for lab work or x-rays in between visits.    ______________________________ Josph Macho, M.D. PRE/MEDQ  D:  02/02/2013  T:  04/16/2013  Job:  [id number]

## 2013-05-29 DIAGNOSIS — M810 Age-related osteoporosis without current pathological fracture: Secondary | ICD-10-CM | POA: Diagnosis not present

## 2013-07-31 DIAGNOSIS — D239 Other benign neoplasm of skin, unspecified: Secondary | ICD-10-CM | POA: Diagnosis not present

## 2013-07-31 DIAGNOSIS — D0470 Carcinoma in situ of skin of unspecified lower limb, including hip: Secondary | ICD-10-CM | POA: Diagnosis not present

## 2013-07-31 DIAGNOSIS — L723 Sebaceous cyst: Secondary | ICD-10-CM | POA: Diagnosis not present

## 2013-07-31 DIAGNOSIS — L819 Disorder of pigmentation, unspecified: Secondary | ICD-10-CM | POA: Diagnosis not present

## 2013-07-31 DIAGNOSIS — C4360 Malignant melanoma of unspecified upper limb, including shoulder: Secondary | ICD-10-CM | POA: Diagnosis not present

## 2013-07-31 DIAGNOSIS — L570 Actinic keratosis: Secondary | ICD-10-CM | POA: Diagnosis not present

## 2013-07-31 DIAGNOSIS — L821 Other seborrheic keratosis: Secondary | ICD-10-CM | POA: Diagnosis not present

## 2013-07-31 DIAGNOSIS — D485 Neoplasm of uncertain behavior of skin: Secondary | ICD-10-CM | POA: Diagnosis not present

## 2013-07-31 DIAGNOSIS — D1801 Hemangioma of skin and subcutaneous tissue: Secondary | ICD-10-CM | POA: Diagnosis not present

## 2013-07-31 DIAGNOSIS — Z85828 Personal history of other malignant neoplasm of skin: Secondary | ICD-10-CM | POA: Diagnosis not present

## 2013-08-03 ENCOUNTER — Ambulatory Visit (HOSPITAL_BASED_OUTPATIENT_CLINIC_OR_DEPARTMENT_OTHER): Payer: Medicare Other | Admitting: Hematology & Oncology

## 2013-08-03 ENCOUNTER — Encounter: Payer: Self-pay | Admitting: Hematology & Oncology

## 2013-08-03 ENCOUNTER — Other Ambulatory Visit (HOSPITAL_BASED_OUTPATIENT_CLINIC_OR_DEPARTMENT_OTHER): Payer: Medicare Other | Admitting: Lab

## 2013-08-03 VITALS — BP 143/63 | HR 98 | Temp 98.0°F | Resp 98 | Ht 61.0 in | Wt 120.0 lb

## 2013-08-03 DIAGNOSIS — T50905S Adverse effect of unspecified drugs, medicaments and biological substances, sequela: Secondary | ICD-10-CM | POA: Diagnosis not present

## 2013-08-03 DIAGNOSIS — Z853 Personal history of malignant neoplasm of breast: Secondary | ICD-10-CM

## 2013-08-03 DIAGNOSIS — T386X5A Adverse effect of antigonadotrophins, antiestrogens, antiandrogens, not elsewhere classified, initial encounter: Principal | ICD-10-CM

## 2013-08-03 DIAGNOSIS — M818 Other osteoporosis without current pathological fracture: Secondary | ICD-10-CM

## 2013-08-03 DIAGNOSIS — T38805A Adverse effect of unspecified hormones and synthetic substitutes, initial encounter: Secondary | ICD-10-CM | POA: Diagnosis not present

## 2013-08-03 LAB — CBC WITH DIFFERENTIAL (CANCER CENTER ONLY)
BASO#: 0 10*3/uL (ref 0.0–0.2)
BASO%: 0.4 % (ref 0.0–2.0)
EOS%: 1.2 % (ref 0.0–7.0)
Eosinophils Absolute: 0.1 10*3/uL (ref 0.0–0.5)
HCT: 45 % (ref 34.8–46.6)
HGB: 15.3 g/dL (ref 11.6–15.9)
LYMPH#: 1.6 10*3/uL (ref 0.9–3.3)
LYMPH%: 21 % (ref 14.0–48.0)
MCH: 29.6 pg (ref 26.0–34.0)
MCHC: 34 g/dL (ref 32.0–36.0)
MCV: 87 fL (ref 81–101)
MONO#: 0.5 10*3/uL (ref 0.1–0.9)
MONO%: 6 % (ref 0.0–13.0)
NEUT#: 5.4 10*3/uL (ref 1.5–6.5)
NEUT%: 71.4 % (ref 39.6–80.0)
Platelets: 236 10*3/uL (ref 145–400)
RBC: 5.17 10*6/uL (ref 3.70–5.32)
RDW: 13.4 % (ref 11.1–15.7)
WBC: 7.6 10*3/uL (ref 3.9–10.0)

## 2013-08-03 LAB — COMPREHENSIVE METABOLIC PANEL
ALT: 11 U/L (ref 0–35)
AST: 23 U/L (ref 0–37)
Albumin: 4.4 g/dL (ref 3.5–5.2)
Alkaline Phosphatase: 58 U/L (ref 39–117)
BUN: 15 mg/dL (ref 6–23)
CO2: 28 mEq/L (ref 19–32)
Calcium: 10.1 mg/dL (ref 8.4–10.5)
Chloride: 100 mEq/L (ref 96–112)
Creatinine, Ser: 0.89 mg/dL (ref 0.50–1.10)
Glucose, Bld: 135 mg/dL — ABNORMAL HIGH (ref 70–99)
Potassium: 3.5 mEq/L (ref 3.5–5.3)
Sodium: 140 mEq/L (ref 135–145)
Total Bilirubin: 0.5 mg/dL (ref 0.2–1.2)
Total Protein: 7.1 g/dL (ref 6.0–8.3)

## 2013-08-04 ENCOUNTER — Telehealth: Payer: Self-pay | Admitting: *Deleted

## 2013-08-04 NOTE — Progress Notes (Signed)
Hematology and Oncology Follow Up Visit  Nancy Bell [id number] 01-16-1925 80 y.o. 08/04/2013   Principle Diagnosis:  Stage I (T1b N0 M0) infiltrating ductal carcinoma of the right breast.  Current Therapy:   Observation     Interim History:  Ms.  Bell is in for followup. She is doing okay. She's had no problems since we last saw her. She's had no nausea vomiting. She's had no bony pain. She's had no cough or shortness of breath. She's had no change in bowel or bladder habits.  She's had a good appetite. There's been no bleeding. She's had an occasional headache.  She was told to stop her Actonel. I think she probably should continue to be on this.  She saw her dermatologist and had several skin lesions removed.  Medications: Current outpatient prescriptions:ALPRAZolam (NIRAVAM) 0.25 MG dissolvable tablet, Take 0.25 mg by mouth as needed., Disp: , Rfl: ;  aspirin 81 MG tablet, Take 81 mg by mouth every other day. , Disp: , Rfl: ;  Biotin (PA BIOTIN) 1000 MCG tablet, Take 1,000 mcg by mouth 3 (three) times daily., Disp: , Rfl: ;  CALCIUM PO, Take 600 mg by mouth daily. , Disp: , Rfl: ;  Coenzyme Q10 (CO Q 10 PO), Take by mouth.  , Disp: , Rfl:  hydrochlorothiazide (HYDRODIURIL) 25 MG tablet, Take 25 mg by mouth Daily. , Disp: , Rfl: ;  lovastatin (MEVACOR) 20 MG tablet, Take 40 mg by mouth at bedtime. , Disp: , Rfl: ;  Multiple Vitamin (MULTIVITAMIN PO), Take by mouth.  , Disp: , Rfl: ;  Omega-3 Fatty Acids (FISH OIL PO), Take by mouth.  , Disp: , Rfl: ;  OVER THE COUNTER MEDICATION, potassium, Disp: , Rfl:  Risedronate Sodium (ACTONEL PO), Take 150 mg by mouth every 30 (thirty) days. , Disp: , Rfl:   Allergies:  Allergies  Allergen Reactions  . Amoxicillin-Pot Clavulanate   . Augmentin [Amoxicillin-Pot Clavulanate] Other (See Comments)    Develops C Diff  . Vicodin [Hydrocodone-Acetaminophen] Other (See Comments)    Did not sleep for 72 hrs    Past Medical History, Surgical  history, Social history, and Family History were reviewed and updated.  Review of Systems: As above  Physical Exam:  height is 5\' 1"  (1.549 m) and weight is 120 lb (54.432 kg). Her oral temperature is 98 F (36.7 C). Her blood pressure is 143/63 and her pulse is 98. Her respiration is 98.   [name]. Head and neck exam shows no ocular or oral lesions. There is no adenopathy on the neck. There is no scleral icterus. Lungs are clear. Cardiac exam regular rate and rhythm with no murmurs rubs or bruits. Breast exam shows left breast no masses edema or erythema. There is no left axillary adenopathy. Right breast shows a well-healed lumpectomy at the 3:00 position. This is like traction of the right breast from surgery. No obvious masses noted. There is no right axillary adenopathy. Abdomen is soft. Has good bowel sounds. There is no fluid wave. Is no palpable hepato- splenomegaly. Back exam shows no tenderness over the spine ribs or hips. There is no osteoporotic changes. Extremities shows no clubbing cyanosis or edema. She's the strength in her muscles. 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 Skin exam shows seborrheic keratoses. Neurological exam is unremarkable.  Lab Results  Component Value Date   WBC 7.6 08/03/2013   HGB 15.3 08/03/2013   HCT 45.0 08/03/2013   MCV  87 08/03/2013   PLT 236 08/03/2013     Chemistry      Component Value Date/Time   NA 140 08/03/2013 1155   K 3.5 08/03/2013 1155   CL 100 08/03/2013 1155   CO2 28 08/03/2013 1155   BUN 15 08/03/2013 1155   CREATININE 0.89 08/03/2013 1155      Component Value Date/Time   CALCIUM 10.1 08/03/2013 1155   ALKPHOS 58 08/03/2013 1155   AST 23 08/03/2013 1155   ALT 11 08/03/2013 1155   BILITOT 0.5 08/03/2013 1155         Impression and Plan: Nancy Bell is a 80 year old white female. She has a history of stage I ductal carcinoma the right breast. She was diagnosed back in September of 2011. She underwent  aromatase inhibitor therapy. She cannot tolerate this. We then tried tamoxifen. She cannot tolerate this either.  So far, there's been no evidence of recurrence.  I want to make sure that she continues on her vitamin D.  I will see her back in 6 months.   [name], MD 3/27/201512:39 PM

## 2013-08-04 NOTE — Telephone Encounter (Addendum)
Message copied by Lenn Sink on Fri Aug 04, 2013  4:04 PM ------      Message from: [name]      Created: Fri Aug 04, 2013  6:52 AM       Call - labs look ok!!  Laurey Arrow ------Informed patient that labs look okay.

## 2013-08-16 DIAGNOSIS — D0470 Carcinoma in situ of skin of unspecified lower limb, including hip: Secondary | ICD-10-CM | POA: Diagnosis not present

## 2013-08-16 DIAGNOSIS — Z85828 Personal history of other malignant neoplasm of skin: Secondary | ICD-10-CM | POA: Diagnosis not present

## 2013-08-22 ENCOUNTER — Other Ambulatory Visit: Payer: Self-pay | Admitting: Dermatology

## 2013-08-22 DIAGNOSIS — Z85828 Personal history of other malignant neoplasm of skin: Secondary | ICD-10-CM | POA: Diagnosis not present

## 2013-08-22 DIAGNOSIS — C4359 Malignant melanoma of other part of trunk: Secondary | ICD-10-CM | POA: Diagnosis not present

## 2013-08-22 DIAGNOSIS — C4360 Malignant melanoma of unspecified upper limb, including shoulder: Secondary | ICD-10-CM | POA: Diagnosis not present

## 2014-01-01 DIAGNOSIS — I10 Essential (primary) hypertension: Secondary | ICD-10-CM | POA: Diagnosis not present

## 2014-01-01 DIAGNOSIS — M542 Cervicalgia: Secondary | ICD-10-CM | POA: Diagnosis not present

## 2014-01-01 DIAGNOSIS — R079 Chest pain, unspecified: Secondary | ICD-10-CM | POA: Diagnosis not present

## 2014-01-11 DIAGNOSIS — Z853 Personal history of malignant neoplasm of breast: Secondary | ICD-10-CM | POA: Diagnosis not present

## 2014-02-02 ENCOUNTER — Ambulatory Visit (HOSPITAL_BASED_OUTPATIENT_CLINIC_OR_DEPARTMENT_OTHER): Payer: Medicare Other | Admitting: Hematology & Oncology

## 2014-02-02 ENCOUNTER — Encounter: Payer: Self-pay | Admitting: Hematology & Oncology

## 2014-02-02 ENCOUNTER — Other Ambulatory Visit (HOSPITAL_BASED_OUTPATIENT_CLINIC_OR_DEPARTMENT_OTHER): Payer: Medicare Other | Admitting: Lab

## 2014-02-02 VITALS — BP 144/69 | HR 84 | Temp 97.4°F | Resp 14 | Ht 61.0 in | Wt 125.0 lb

## 2014-02-02 DIAGNOSIS — Z853 Personal history of malignant neoplasm of breast: Secondary | ICD-10-CM

## 2014-02-02 DIAGNOSIS — C50919 Malignant neoplasm of unspecified site of unspecified female breast: Secondary | ICD-10-CM | POA: Diagnosis not present

## 2014-02-02 DIAGNOSIS — M818 Other osteoporosis without current pathological fracture: Secondary | ICD-10-CM

## 2014-02-02 DIAGNOSIS — T386X5A Adverse effect of antigonadotrophins, antiestrogens, antiandrogens, not elsewhere classified, initial encounter: Principal | ICD-10-CM

## 2014-02-02 DIAGNOSIS — T38805A Adverse effect of unspecified hormones and synthetic substitutes, initial encounter: Secondary | ICD-10-CM | POA: Diagnosis not present

## 2014-02-02 LAB — CBC WITH DIFFERENTIAL (CANCER CENTER ONLY)
BASO#: 0 10*3/uL (ref 0.0–0.2)
BASO%: 0.4 % (ref 0.0–2.0)
EOS%: 2.2 % (ref 0.0–7.0)
Eosinophils Absolute: 0.2 10*3/uL (ref 0.0–0.5)
HCT: 44.3 % (ref 34.8–46.6)
HGB: 14.8 g/dL (ref 11.6–15.9)
LYMPH#: 2.3 10*3/uL (ref 0.9–3.3)
LYMPH%: 29.6 % (ref 14.0–48.0)
MCH: 29.8 pg (ref 26.0–34.0)
MCHC: 33.4 g/dL (ref 32.0–36.0)
MCV: 89 fL (ref 81–101)
MONO#: 0.6 10*3/uL (ref 0.1–0.9)
MONO%: 8.1 % (ref 0.0–13.0)
NEUT#: 4.6 10*3/uL (ref 1.5–6.5)
NEUT%: 59.7 % (ref 39.6–80.0)
Platelets: 253 10*3/uL (ref 145–400)
RBC: 4.97 10*6/uL (ref 3.70–5.32)
RDW: 13 % (ref 11.1–15.7)
WBC: 7.7 10*3/uL (ref 3.9–10.0)

## 2014-02-02 LAB — COMPREHENSIVE METABOLIC PANEL
ALT: 11 U/L (ref 0–35)
AST: 23 U/L (ref 0–37)
Albumin: 4.2 g/dL (ref 3.5–5.2)
Alkaline Phosphatase: 59 U/L (ref 39–117)
BUN: 16 mg/dL (ref 6–23)
CO2: 29 mEq/L (ref 19–32)
Calcium: 10 mg/dL (ref 8.4–10.5)
Chloride: 101 mEq/L (ref 96–112)
Creatinine, Ser: 0.97 mg/dL (ref 0.50–1.10)
Glucose, Bld: 139 mg/dL — ABNORMAL HIGH (ref 70–99)
Potassium: 3.9 mEq/L (ref 3.5–5.3)
Sodium: 139 mEq/L (ref 135–145)
Total Bilirubin: 0.5 mg/dL (ref 0.2–1.2)
Total Protein: 6.9 g/dL (ref 6.0–8.3)

## 2014-02-02 NOTE — Progress Notes (Signed)
Hematology and Oncology Follow Up Visit  Nancy Bell [id number] 1925/02/08 80 y.o. 02/02/2014   Principle Diagnosis:  Stage I (T1b N0 M0) infiltrating ductal carcinoma of the right breast.  Current Therapy:   Observation     Interim History:  Ms.  Bell is in for followup. She is doing okay. She's had no problems since we last saw her. She went to the beach with her family during the summer. She good time.  She's had no problems with headache. She's had no bleeding. She had no cough. She's had no problems with infections. She's had a good appetite.  She was told to stop her Actonel. I think she probably should continue to be on this.   Medications: Current outpatient prescriptions:ALPRAZolam (NIRAVAM) 0.25 MG dissolvable tablet, Take 0.25 mg by mouth as needed., Disp: , Rfl: ;  aspirin 81 MG tablet, Take 81 mg by mouth every other day. , Disp: , Rfl: ;  Biotin (PA BIOTIN) 1000 MCG tablet, Take 1,000 mcg by mouth 3 (three) times daily., Disp: , Rfl: ;  CALCIUM PO, Take 600 mg by mouth daily. , Disp: , Rfl: ;  Coenzyme Q10 (CO Q 10 PO), Take by mouth.  , Disp: , Rfl:  hydrochlorothiazide (HYDRODIURIL) 25 MG tablet, Take 25 mg by mouth Daily. , Disp: , Rfl: ;  lovastatin (MEVACOR) 20 MG tablet, Take 40 mg by mouth at bedtime. , Disp: , Rfl: ;  Multiple Vitamin (MULTIVITAMIN PO), Take by mouth.  , Disp: , Rfl: ;  Omega-3 Fatty Acids (FISH OIL PO), Take by mouth.  , Disp: , Rfl: ;  OVER THE COUNTER MEDICATION, potassium, Disp: , Rfl:  Risedronate Sodium (ACTONEL PO), Take 150 mg by mouth every 30 (thirty) days. , Disp: , Rfl:   Allergies:  Allergies  Allergen Reactions  . Amoxicillin-Pot Clavulanate   . Augmentin [Amoxicillin-Pot Clavulanate] Other (See Comments)    Develops C Diff  . Vicodin [Hydrocodone-Acetaminophen] Other (See Comments)    Did not sleep for 72 hrs    Past Medical History, Surgical history, Social history, and Family History were reviewed and updated.  Review  of Systems: As above  Physical Exam:  height is 5\' 1"  (1.549 m) and weight is 125 lb (56.7 kg). Her oral temperature is 97.4 F (36.3 C). Her blood pressure is 144/69 and her pulse is 84. Her respiration is 14.   [name]. Head and neck exam shows no ocular or oral lesions. There is no adenopathy on the neck. There is no scleral icterus. Lungs are clear. Cardiac exam regular rate and rhythm with no murmurs rubs or bruits. Breast exam shows left breast no masses edema or erythema. There is no left axillary adenopathy. Right breast shows a well-healed lumpectomy at the 3:00 position. This is like traction of the right breast from surgery. No obvious masses noted. There is no right axillary adenopathy. Abdomen is soft. Has good bowel sounds. There is no fluid wave. Is no palpable hepato- splenomegaly. Back exam shows no tenderness over the spine ribs or hips. There is no osteoporotic changes. Extremities shows no clubbing cyanosis or edema. She's the strength in her muscles. 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 Skin exam shows seborrheic keratoses. Neurological exam is unremarkable.  Lab Results  Component Value Date   WBC 7.7 02/02/2014   HGB 14.8 02/02/2014   HCT 44.3 02/02/2014   MCV 89 02/02/2014   PLT 253 02/02/2014     Chemistry  Component Value Date/Time   NA 140 08/03/2013 1155   K 3.5 08/03/2013 1155   CL 100 08/03/2013 1155   CO2 28 08/03/2013 1155   BUN 15 08/03/2013 1155   CREATININE 0.89 08/03/2013 1155      Component Value Date/Time   CALCIUM 10.1 08/03/2013 1155   ALKPHOS 58 08/03/2013 1155   AST 23 08/03/2013 1155   ALT 11 08/03/2013 1155   BILITOT 0.5 08/03/2013 1155         Impression and Plan: Nancy Bell is a 80 year old white female. She has a history of stage I ductal carcinoma the right breast. She was diagnosed back in September of 2011. She underwent aromatase inhibitor therapy. She is off all therapy because of poor tolerance.   So  far, there's been no evidence of recurrence.  I want to make sure that she continues on her vitamin D.  I will see her back in 6 months.   [name], MD 9/25/201510:40 AM

## 2014-02-08 DIAGNOSIS — Z23 Encounter for immunization: Secondary | ICD-10-CM | POA: Diagnosis not present

## 2014-02-27 DIAGNOSIS — D1801 Hemangioma of skin and subcutaneous tissue: Secondary | ICD-10-CM | POA: Diagnosis not present

## 2014-02-27 DIAGNOSIS — D225 Melanocytic nevi of trunk: Secondary | ICD-10-CM | POA: Diagnosis not present

## 2014-02-27 DIAGNOSIS — L814 Other melanin hyperpigmentation: Secondary | ICD-10-CM | POA: Diagnosis not present

## 2014-02-27 DIAGNOSIS — Z85820 Personal history of malignant melanoma of skin: Secondary | ICD-10-CM | POA: Diagnosis not present

## 2014-02-27 DIAGNOSIS — L821 Other seborrheic keratosis: Secondary | ICD-10-CM | POA: Diagnosis not present

## 2014-02-27 DIAGNOSIS — Z85828 Personal history of other malignant neoplasm of skin: Secondary | ICD-10-CM | POA: Diagnosis not present

## 2014-02-27 DIAGNOSIS — L720 Epidermal cyst: Secondary | ICD-10-CM | POA: Diagnosis not present

## 2014-04-12 DIAGNOSIS — Z79899 Other long term (current) drug therapy: Secondary | ICD-10-CM | POA: Diagnosis not present

## 2014-04-12 DIAGNOSIS — E780 Pure hypercholesterolemia: Secondary | ICD-10-CM | POA: Diagnosis not present

## 2014-04-12 DIAGNOSIS — M810 Age-related osteoporosis without current pathological fracture: Secondary | ICD-10-CM | POA: Diagnosis not present

## 2014-04-12 DIAGNOSIS — I10 Essential (primary) hypertension: Secondary | ICD-10-CM | POA: Diagnosis not present

## 2014-04-12 DIAGNOSIS — F419 Anxiety disorder, unspecified: Secondary | ICD-10-CM | POA: Diagnosis not present

## 2014-04-12 DIAGNOSIS — R739 Hyperglycemia, unspecified: Secondary | ICD-10-CM | POA: Diagnosis not present

## 2014-04-12 DIAGNOSIS — Z23 Encounter for immunization: Secondary | ICD-10-CM | POA: Diagnosis not present

## 2014-08-03 ENCOUNTER — Encounter: Payer: Self-pay | Admitting: Hematology & Oncology

## 2014-08-03 ENCOUNTER — Ambulatory Visit (HOSPITAL_BASED_OUTPATIENT_CLINIC_OR_DEPARTMENT_OTHER): Payer: Medicare Other | Admitting: Hematology & Oncology

## 2014-08-03 ENCOUNTER — Other Ambulatory Visit (HOSPITAL_BASED_OUTPATIENT_CLINIC_OR_DEPARTMENT_OTHER): Payer: Medicare Other

## 2014-08-03 VITALS — BP 144/57 | HR 81 | Temp 98.1°F | Resp 14 | Ht 61.0 in | Wt 128.0 lb

## 2014-08-03 DIAGNOSIS — E559 Vitamin D deficiency, unspecified: Secondary | ICD-10-CM

## 2014-08-03 DIAGNOSIS — Z853 Personal history of malignant neoplasm of breast: Secondary | ICD-10-CM | POA: Diagnosis not present

## 2014-08-03 LAB — COMPREHENSIVE METABOLIC PANEL
ALT: 14 U/L (ref 0–35)
AST: 26 U/L (ref 0–37)
Albumin: 4.2 g/dL (ref 3.5–5.2)
Alkaline Phosphatase: 61 U/L (ref 39–117)
BUN: 21 mg/dL (ref 6–23)
CO2: 27 mEq/L (ref 19–32)
Calcium: 9.6 mg/dL (ref 8.4–10.5)
Chloride: 101 mEq/L (ref 96–112)
Creatinine, Ser: 0.92 mg/dL (ref 0.50–1.10)
Glucose, Bld: 105 mg/dL — ABNORMAL HIGH (ref 70–99)
Potassium: 3.7 mEq/L (ref 3.5–5.3)
Sodium: 141 mEq/L (ref 135–145)
Total Bilirubin: 0.4 mg/dL (ref 0.2–1.2)
Total Protein: 6.5 g/dL (ref 6.0–8.3)

## 2014-08-03 LAB — CBC WITH DIFFERENTIAL (CANCER CENTER ONLY)
BASO#: 0.1 10*3/uL (ref 0.0–0.2)
BASO%: 0.7 % (ref 0.0–2.0)
EOS%: 2.9 % (ref 0.0–7.0)
Eosinophils Absolute: 0.2 10*3/uL (ref 0.0–0.5)
HCT: 42.9 % (ref 34.8–46.6)
HGB: 14.3 g/dL (ref 11.6–15.9)
LYMPH#: 2.3 10*3/uL (ref 0.9–3.3)
LYMPH%: 31.4 % (ref 14.0–48.0)
MCH: 29.7 pg (ref 26.0–34.0)
MCHC: 33.3 g/dL (ref 32.0–36.0)
MCV: 89 fL (ref 81–101)
MONO#: 0.8 10*3/uL (ref 0.1–0.9)
MONO%: 10.5 % (ref 0.0–13.0)
NEUT#: 3.9 10*3/uL (ref 1.5–6.5)
NEUT%: 54.5 % (ref 39.6–80.0)
Platelets: 229 10*3/uL (ref 145–400)
RBC: 4.81 10*6/uL (ref 3.70–5.32)
RDW: 12.9 % (ref 11.1–15.7)
WBC: 7.2 10*3/uL (ref 3.9–10.0)

## 2014-08-03 NOTE — Progress Notes (Signed)
Hematology and Oncology Follow Up Visit  Nancy Bell [id number] 18-Mar-1925 81 y.o. 08/03/2014   Principle Diagnosis:  Stage I (T1b N0 M0) infiltrating ductal carcinoma of the right breast.  Current Therapy:   Observation     Interim History:  Nancy Bell is in for followup. Her main complaint right now is that she pulled a muscle in her lower back. This was over on the right side. This happened yesterday. She really was not doing anything that she can think of. She has some pain radiation down the lateral right thigh. This may be some referred pain.  Otherwise, she is feeling well. She's not having any problems with cough. Is no shortness of breath. There is no nausea or vomiting. She's had no change in bowel or bladder habits. She's had no rashes.  As always, she will be going to the beach in July with the family.  Currently, her performance status is ECOG 1.   Medications:  Current outpatient prescriptions:  .  ALPRAZolam (NIRAVAM) 0.25 MG dissolvable tablet, Take 0.25 mg by mouth as needed., Disp: , Rfl:  .  aspirin 81 MG tablet, Take 81 mg by mouth every other day. , Disp: , Rfl:  .  Biotin (PA BIOTIN) 1000 MCG tablet, Take 1,000 mcg by mouth 3 (three) times daily., Disp: , Rfl:  .  CALCIUM PO, Take 600 mg by mouth daily. , Disp: , Rfl:  .  Coenzyme Q10 (CO Q 10 PO), Take by mouth.  , Disp: , Rfl:  .  diphenhydrAMINE (BENADRYL) 25 MG tablet, Take 25 mg by mouth at bedtime as needed., Disp: , Rfl:  .  hydrochlorothiazide (HYDRODIURIL) 25 MG tablet, Take 25 mg by mouth Daily. , Disp: , Rfl:  .  lovastatin (MEVACOR) 20 MG tablet, Take 40 mg by mouth at bedtime. , Disp: , Rfl:  .  Multiple Vitamin (MULTIVITAMIN PO), Take by mouth.  , Disp: , Rfl:  .  Omega-3 Fatty Acids (FISH OIL PO), Take by mouth.  , Disp: , Rfl:  .  OVER THE COUNTER MEDICATION, potassium, Disp: , Rfl:   Allergies:  Allergies  Allergen Reactions  . Amoxicillin-Pot Clavulanate   . Augmentin  [Amoxicillin-Pot Clavulanate] Other (See Comments)    Develops C Diff  . Vicodin [Hydrocodone-Acetaminophen] Other (See Comments)    Did not sleep for 72 hrs    Past Medical History, Surgical history, Social history, and Family History were reviewed and updated.  Review of Systems: As above  Physical Exam:  height is 5\' 1"  (1.549 m) and weight is 128 lb (58.06 kg). Her oral temperature is 98.1 F (36.7 C). Her blood pressure is 144/57 and her pulse is 81. Her respiration is 14.   [name]. Head and neck exam shows no ocular or oral lesions. There is no adenopathy on the neck. There is no scleral icterus. Lungs are clear. Cardiac exam regular rate and rhythm with no murmurs rubs or bruits. Breast exam shows left breast no masses edema or erythema. There is no left axillary adenopathy. Right breast shows a well-healed lumpectomy at the 3:00 position. This is like traction of the right breast from surgery. No obvious masses noted. There is no right axillary adenopathy. Abdomen is soft. Has good bowel sounds. There is no fluid wave. Is no palpable hepato- splenomegaly. Back exam shows no tenderness over the spine ribs or hips. There is no osteoporotic changes. Extremities shows no clubbing cyanosis or edema. She's the strength in  her muscles. 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 Skin exam shows seborrheic keratoses. Neurological exam is unremarkable.  Lab Results  Component Value Date   WBC 7.2 08/03/2014   HGB 14.3 08/03/2014   HCT 42.9 08/03/2014   MCV 89 08/03/2014   PLT 229 08/03/2014     Chemistry      Component Value Date/Time   NA 139 02/02/2014 0932   K 3.9 02/02/2014 0932   CL 101 02/02/2014 0932   CO2 29 02/02/2014 0932   BUN 16 02/02/2014 0932   CREATININE 0.97 02/02/2014 0932      Component Value Date/Time   CALCIUM 10.0 02/02/2014 0932   ALKPHOS 59 02/02/2014 0932   AST 23 02/02/2014 0932   ALT 11 02/02/2014 0932   BILITOT 0.5  02/02/2014 0932         Impression and Plan: Nancy Bell is a 81 year old white female. She has a history of stage I ductal carcinoma the right breast. She was diagnosed back in September of 2011. She underwent aromatase inhibitor therapy. She is off all therapy because of poor tolerance.   So far, there's been no evidence of recurrence.  I think this right hip and leg pain probably is more of a strain than anything else. It feels like sacroiliitis. As such, I told her to try some ice on the area. He is on take a nonsteroidal if she needed to.  I want to make sure that she continues on her vitamin D.  I will see her back in 6 months.   [name], MD 3/25/201610:56 AM

## 2014-08-04 LAB — VITAMIN D 25 HYDROXY (VIT D DEFICIENCY, FRACTURES): Vit D, 25-Hydroxy: 75 ng/mL (ref 30–100)

## 2014-08-27 DIAGNOSIS — L570 Actinic keratosis: Secondary | ICD-10-CM | POA: Diagnosis not present

## 2014-08-27 DIAGNOSIS — D225 Melanocytic nevi of trunk: Secondary | ICD-10-CM | POA: Diagnosis not present

## 2014-08-27 DIAGNOSIS — L720 Epidermal cyst: Secondary | ICD-10-CM | POA: Diagnosis not present

## 2014-08-27 DIAGNOSIS — Z85828 Personal history of other malignant neoplasm of skin: Secondary | ICD-10-CM | POA: Diagnosis not present

## 2014-08-27 DIAGNOSIS — L821 Other seborrheic keratosis: Secondary | ICD-10-CM | POA: Diagnosis not present

## 2014-08-27 DIAGNOSIS — L253 Unspecified contact dermatitis due to other chemical products: Secondary | ICD-10-CM | POA: Diagnosis not present

## 2015-01-16 DIAGNOSIS — Z853 Personal history of malignant neoplasm of breast: Secondary | ICD-10-CM | POA: Diagnosis not present

## 2015-02-01 ENCOUNTER — Ambulatory Visit (HOSPITAL_BASED_OUTPATIENT_CLINIC_OR_DEPARTMENT_OTHER): Payer: Medicare Other

## 2015-02-01 ENCOUNTER — Other Ambulatory Visit (HOSPITAL_BASED_OUTPATIENT_CLINIC_OR_DEPARTMENT_OTHER): Payer: Medicare Other

## 2015-02-01 ENCOUNTER — Encounter: Payer: Self-pay | Admitting: Hematology & Oncology

## 2015-02-01 ENCOUNTER — Ambulatory Visit (HOSPITAL_BASED_OUTPATIENT_CLINIC_OR_DEPARTMENT_OTHER): Payer: Medicare Other | Admitting: Hematology & Oncology

## 2015-02-01 VITALS — BP 144/61 | HR 83 | Temp 97.5°F | Resp 16 | Ht 61.0 in | Wt 127.0 lb

## 2015-02-01 DIAGNOSIS — E559 Vitamin D deficiency, unspecified: Secondary | ICD-10-CM | POA: Diagnosis not present

## 2015-02-01 DIAGNOSIS — Z23 Encounter for immunization: Secondary | ICD-10-CM

## 2015-02-01 DIAGNOSIS — Z853 Personal history of malignant neoplasm of breast: Secondary | ICD-10-CM

## 2015-02-01 LAB — CBC WITH DIFFERENTIAL (CANCER CENTER ONLY)
BASO#: 0 10*3/uL (ref 0.0–0.2)
BASO%: 0.4 % (ref 0.0–2.0)
EOS%: 2.3 % (ref 0.0–7.0)
Eosinophils Absolute: 0.2 10*3/uL (ref 0.0–0.5)
HCT: 43.6 % (ref 34.8–46.6)
HGB: 14.7 g/dL (ref 11.6–15.9)
LYMPH#: 2.3 10*3/uL (ref 0.9–3.3)
LYMPH%: 30.2 % (ref 14.0–48.0)
MCH: 29.6 pg (ref 26.0–34.0)
MCHC: 33.7 g/dL (ref 32.0–36.0)
MCV: 88 fL (ref 81–101)
MONO#: 0.7 10*3/uL (ref 0.1–0.9)
MONO%: 9.1 % (ref 0.0–13.0)
NEUT#: 4.4 10*3/uL (ref 1.5–6.5)
NEUT%: 58 % (ref 39.6–80.0)
Platelets: 249 10*3/uL (ref 145–400)
RBC: 4.96 10*6/uL (ref 3.70–5.32)
RDW: 13.2 % (ref 11.1–15.7)
WBC: 7.5 10*3/uL (ref 3.9–10.0)

## 2015-02-01 LAB — COMPREHENSIVE METABOLIC PANEL (CC13)
ALT: 12 U/L (ref 0–55)
AST: 25 U/L (ref 5–34)
Albumin: 3.9 g/dL (ref 3.5–5.0)
Alkaline Phosphatase: 76 U/L (ref 40–150)
Anion Gap: 8 mEq/L (ref 3–11)
BUN: 14.6 mg/dL (ref 7.0–26.0)
CO2: 29 mEq/L (ref 22–29)
Calcium: 9.6 mg/dL (ref 8.4–10.4)
Chloride: 104 mEq/L (ref 98–109)
Creatinine: 0.9 mg/dL (ref 0.6–1.1)
EGFR: 59 mL/min/{1.73_m2} — ABNORMAL LOW (ref >90)
Glucose: 107 mg/dl (ref 70–140)
Potassium: 3.6 mEq/L (ref 3.5–5.1)
Sodium: 141 mEq/L (ref 136–145)
Total Bilirubin: 0.59 mg/dL (ref 0.20–1.20)
Total Protein: 7 g/dL (ref 6.4–8.3)

## 2015-02-01 MED ORDER — INFLUENZA VAC SPLIT QUAD 0.5 ML IM SUSY
0.5000 mL | PREFILLED_SYRINGE | Freq: Once | INTRAMUSCULAR | Status: AC
  Administered 2015-02-01: 0.5 mL via INTRAMUSCULAR
  Filled 2015-02-01: qty 0.5

## 2015-02-01 NOTE — Progress Notes (Signed)
Hematology and Oncology Follow Up Visit  [name] [id number] [birth year] 81 y.o. 02/01/2015   Principle Diagnosis:  Stage I (T1b N0 M0) infiltrating ductal carcinoma of the right breast.  Current Therapy:   Observation     Interim History:  Ms.  Nancy Bell is in for followup. She has had a good summer. She was not too happy about the hot weather.  She has been doing exercising. She's been traveling.  Her appetite has been good. She's had no nausea or vomiting. There's been no change in bowel or bladder habits.  She did have a mammogram done in early September. Mammogram showed no evidence of malignancy.  She's had no leg swelling. She's had no rashes.  Currently, her performance status is ECOG 1.   Medications:  Current outpatient prescriptions:  .  ALPRAZolam (XANAX) 0.25 MG tablet, TAKE 1/2 A TABLET AS NEEDED FOR 30 DAYS, Disp: , Rfl: 2 .  aspirin 81 MG tablet, Take 81 mg by mouth every other day. , Disp: , Rfl:  .  Biotin (PA BIOTIN) 1000 MCG tablet, Take 1,000 mcg by mouth 3 (three) times daily., Disp: , Rfl:  .  CALCIUM PO, Take 600 mg by mouth daily. , Disp: , Rfl:  .  Coenzyme Q10 (CO Q 10 PO), Take by mouth.  , Disp: , Rfl:  .  diphenhydrAMINE (BENADRYL) 25 MG tablet, Take 25 mg by mouth at bedtime as needed., Disp: , Rfl:  .  hydrochlorothiazide (HYDRODIURIL) 25 MG tablet, Take 25 mg by mouth Daily. , Disp: , Rfl:  .  lovastatin (MEVACOR) 40 MG tablet, , Disp: , Rfl:  .  Multiple Vitamin (MULTIVITAMIN PO), Take by mouth.  , Disp: , Rfl:  .  Omega-3 Fatty Acids (FISH OIL PO), Take by mouth.  , Disp: , Rfl:  .  OVER THE COUNTER MEDICATION, potassium, Disp: , Rfl:   Current facility-administered medications:  .  Influenza vac split quadrivalent PF (FLUARIX) injection 0.5 mL, 0.5 mL, Intramuscular, Once, Nancy Napoleon, MD  Allergies:  Allergies  Allergen Reactions  . Amoxicillin-Pot Clavulanate   . Augmentin [Amoxicillin-Pot Clavulanate] Other (See Comments)     Develops C Diff  . Vicodin [Hydrocodone-Acetaminophen] Other (See Comments)    Did not sleep for 72 hrs    Past Medical History, Surgical history, Social history, and Family History were reviewed and updated.  Review of Systems: As above  Physical Exam:  height is 5\' 1"  (1.549 m) and weight is 127 lb (57.607 kg). Her oral temperature is 97.5 F (36.4 C). Her blood pressure is 144/61 and her pulse is 83. Her respiration is 16.   [name]. Head and neck exam shows no ocular or oral lesions. There is no adenopathy on the neck. There is no scleral icterus. Lungs are clear. Cardiac exam regular rate and rhythm with no murmurs rubs or bruits. Breast exam shows left breast no masses edema or erythema. There is no left axillary adenopathy. Right breast shows a well-healed lumpectomy at the 3:00 position. This is some contraction of the right breast from surgery. No obvious masses noted. There is no right axillary adenopathy. Abdomen is soft. She has good bowel sounds. There is no fluid wave. Is no palpable hepato- splenomegaly. Back exam shows no tenderness over the spine ribs or hips. There is no osteoporotic changes. Extremities shows no clubbing cyanosis or edema. She's the strength in her muscles. 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 Skin exam  shows seborrheic keratoses. Neurological exam is unremarkable.  Lab Results  Component Value Date   WBC 7.5 02/01/2015   HGB 14.7 02/01/2015   HCT 43.6 02/01/2015   MCV 88 02/01/2015   PLT 249 02/01/2015     Chemistry      Component Value Date/Time   NA 141 08/03/2014 0945   K 3.7 08/03/2014 0945   CL 101 08/03/2014 0945   CO2 27 08/03/2014 0945   BUN 21 08/03/2014 0945   CREATININE 0.92 08/03/2014 0945      Component Value Date/Time   CALCIUM 9.6 08/03/2014 0945   ALKPHOS 61 08/03/2014 0945   AST 26 08/03/2014 0945   ALT 14 08/03/2014 0945   BILITOT 0.4 08/03/2014 0945         Impression and  Plan: Ms. Nancy Bell is a 81 year old white female. She has a history of stage I ductal carcinoma the right breast. She was diagnosed back in September of 2011. She underwent aromatase inhibitor therapy. She is off all therapy because of poor tolerance.   So far, there's been no evidence of recurrence.   I will see her back in 6 months.   Nancy Napoleon, MD 9/23/201610:01 AM

## 2015-02-01 NOTE — Patient Instructions (Signed)

## 2015-02-02 LAB — VITAMIN D 25 HYDROXY (VIT D DEFICIENCY, FRACTURES): Vit D, 25-Hydroxy: 69 ng/mL (ref 30–100)

## 2015-05-07 DIAGNOSIS — Z79899 Other long term (current) drug therapy: Secondary | ICD-10-CM | POA: Diagnosis not present

## 2015-05-07 DIAGNOSIS — M810 Age-related osteoporosis without current pathological fracture: Secondary | ICD-10-CM | POA: Diagnosis not present

## 2015-05-07 DIAGNOSIS — F419 Anxiety disorder, unspecified: Secondary | ICD-10-CM | POA: Diagnosis not present

## 2015-05-07 DIAGNOSIS — I10 Essential (primary) hypertension: Secondary | ICD-10-CM | POA: Diagnosis not present

## 2015-05-07 DIAGNOSIS — R42 Dizziness and giddiness: Secondary | ICD-10-CM | POA: Diagnosis not present

## 2015-05-07 DIAGNOSIS — R739 Hyperglycemia, unspecified: Secondary | ICD-10-CM | POA: Diagnosis not present

## 2015-05-07 DIAGNOSIS — E7800 Pure hypercholesterolemia, unspecified: Secondary | ICD-10-CM | POA: Diagnosis not present

## 2015-06-24 DIAGNOSIS — J069 Acute upper respiratory infection, unspecified: Secondary | ICD-10-CM | POA: Diagnosis not present

## 2015-08-02 ENCOUNTER — Encounter: Payer: Self-pay | Admitting: Hematology & Oncology

## 2015-08-02 ENCOUNTER — Ambulatory Visit (HOSPITAL_BASED_OUTPATIENT_CLINIC_OR_DEPARTMENT_OTHER): Payer: Medicare Other | Admitting: Hematology & Oncology

## 2015-08-02 ENCOUNTER — Other Ambulatory Visit (HOSPITAL_BASED_OUTPATIENT_CLINIC_OR_DEPARTMENT_OTHER): Payer: Medicare Other

## 2015-08-02 VITALS — BP 150/61 | HR 95 | Temp 98.2°F | Resp 18 | Ht 61.0 in | Wt 129.0 lb

## 2015-08-02 DIAGNOSIS — Z853 Personal history of malignant neoplasm of breast: Secondary | ICD-10-CM

## 2015-08-02 DIAGNOSIS — Z23 Encounter for immunization: Secondary | ICD-10-CM

## 2015-08-02 DIAGNOSIS — E559 Vitamin D deficiency, unspecified: Secondary | ICD-10-CM | POA: Diagnosis not present

## 2015-08-02 DIAGNOSIS — M818 Other osteoporosis without current pathological fracture: Secondary | ICD-10-CM

## 2015-08-02 DIAGNOSIS — T386X5A Adverse effect of antigonadotrophins, antiestrogens, antiandrogens, not elsewhere classified, initial encounter: Secondary | ICD-10-CM

## 2015-08-02 LAB — COMPREHENSIVE METABOLIC PANEL
ALT: 13 U/L (ref 0–55)
AST: 25 U/L (ref 5–34)
Albumin: 4 g/dL (ref 3.5–5.0)
Alkaline Phosphatase: 78 U/L (ref 40–150)
Anion Gap: 10 mEq/L (ref 3–11)
BUN: 16 mg/dL (ref 7.0–26.0)
CO2: 28 mEq/L (ref 22–29)
Calcium: 9.7 mg/dL (ref 8.4–10.4)
Chloride: 104 mEq/L (ref 98–109)
Creatinine: 1 mg/dL (ref 0.6–1.1)
EGFR: 53 mL/min/{1.73_m2} — ABNORMAL LOW (ref >90)
Glucose: 123 mg/dl (ref 70–140)
Potassium: 3.4 mEq/L — ABNORMAL LOW (ref 3.5–5.1)
Sodium: 142 mEq/L (ref 136–145)
Total Bilirubin: 0.53 mg/dL (ref 0.20–1.20)
Total Protein: 7.4 g/dL (ref 6.4–8.3)

## 2015-08-02 LAB — CBC WITH DIFFERENTIAL (CANCER CENTER ONLY)
BASO#: 0.1 10*3/uL (ref 0.0–0.2)
BASO%: 0.7 % (ref 0.0–2.0)
EOS%: 3.6 % (ref 0.0–7.0)
Eosinophils Absolute: 0.3 10*3/uL (ref 0.0–0.5)
HCT: 44.4 % (ref 34.8–46.6)
HGB: 15 g/dL (ref 11.6–15.9)
LYMPH#: 2.3 10*3/uL (ref 0.9–3.3)
LYMPH%: 28.5 % (ref 14.0–48.0)
MCH: 29.6 pg (ref 26.0–34.0)
MCHC: 33.8 g/dL (ref 32.0–36.0)
MCV: 88 fL (ref 81–101)
MONO#: 0.7 10*3/uL (ref 0.1–0.9)
MONO%: 9.1 % (ref 0.0–13.0)
NEUT#: 4.7 10*3/uL (ref 1.5–6.5)
NEUT%: 58.1 % (ref 39.6–80.0)
Platelets: 243 10*3/uL (ref 145–400)
RBC: 5.06 10*6/uL (ref 3.70–5.32)
RDW: 13.3 % (ref 11.1–15.7)
WBC: 8 10*3/uL (ref 3.9–10.0)

## 2015-08-02 NOTE — Progress Notes (Signed)
Hematology and Oncology Follow Up Visit  Nancy Bell [id number] 1924-11-24 82 y.o. 08/02/2015   Principle Diagnosis:  Stage I (T1b N0 M0) infiltrating ductal carcinoma of the right breast.  Current Therapy:   Observation     Interim History:  Ms.  Nancy Bell is in for followup. She has had a good fall and winter..She has been very busy in her house. She try to clean out her house. His been somewhat difficult to accomplish.  She's had no fever. She had no pounds over the winter or during the holidays.  She's had no bleeding or bruising. She's had no change in bowel or bladder habits. She's had no rashes. She's had no joint swelling.  Currently, her performance status is ECOG 1.   Medications:  Current outpatient prescriptions:  .  ALPRAZolam (XANAX) 0.25 MG tablet, TAKE 1/2 A TABLET AS NEEDED FOR 30 DAYS, Disp: , Rfl: 2 .  aspirin 81 MG tablet, Take 81 mg by mouth every other day. , Disp: , Rfl:  .  CALCIUM PO, Take 600 mg by mouth daily. , Disp: , Rfl:  .  Coenzyme Q10 (CO Q 10 PO), Take by mouth.  , Disp: , Rfl:  .  diphenhydrAMINE (BENADRYL) 25 MG tablet, Take 25 mg by mouth at bedtime as needed., Disp: , Rfl:  .  hydrochlorothiazide (HYDRODIURIL) 25 MG tablet, Take 25 mg by mouth Daily. , Disp: , Rfl:  .  lovastatin (MEVACOR) 40 MG tablet, , Disp: , Rfl:  .  Multiple Vitamin (MULTIVITAMIN PO), Take by mouth.  , Disp: , Rfl:  .  Omega-3 Fatty Acids (FISH OIL PO), Take by mouth.  , Disp: , Rfl:  .  OVER THE COUNTER MEDICATION, potassium, Disp: , Rfl:  .  Biotin (PA BIOTIN) 1000 MCG tablet, Take 1,000 mcg by mouth 3 (three) times daily., Disp: , Rfl:   Allergies:  Allergies  Allergen Reactions  . Amoxicillin-Pot Clavulanate   . Augmentin [Amoxicillin-Pot Clavulanate] Other (See Comments)    Develops C Diff  . Vicodin [Hydrocodone-Acetaminophen] Other (See Comments)    Did not sleep for 72 hrs    Past Medical History, Surgical history, Social history, and Family History  were reviewed and updated.  Review of Systems: As above  Physical Exam:  height is 5\' 1"  (1.549 m) and weight is 129 lb (58.514 kg). Her oral temperature is 98.2 F (36.8 C). Her blood pressure is 150/61 and her pulse is 95. Her respiration is 18.   [name]. Head and neck exam shows no ocular or oral lesions. There is no adenopathy on the neck. There is no scleral icterus. Lungs are clear. Cardiac exam regular rate and rhythm with no murmurs rubs or bruits. Breast exam shows left breast no masses edema or erythema. There is no left axillary adenopathy. Right breast shows a well-healed lumpectomy at the 3:00 position. This is some contraction of the right breast from surgery. No obvious masses noted. There is no right axillary adenopathy. Abdomen is soft. She has good bowel sounds. There is no fluid wave. Is no palpable hepato- splenomegaly. Back exam shows no tenderness over the spine ribs or hips. There is no osteoporotic changes. Extremities shows no clubbing cyanosis or edema. She's the strength in her muscles. 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 Skin exam shows seborrheic keratoses. Neurological exam is unremarkable.  Lab Results  Component Value Date   WBC 8.0 08/02/2015   HGB 15.0 08/02/2015  HCT 44.4 08/02/2015   MCV 88 08/02/2015   PLT 243 08/02/2015     Chemistry      Component Value Date/Time   NA 141 02/01/2015 0916   NA 141 08/03/2014 0945   K 3.6 02/01/2015 0916   K 3.7 08/03/2014 0945   CL 101 08/03/2014 0945   CO2 29 02/01/2015 0916   CO2 27 08/03/2014 0945   BUN 14.6 02/01/2015 0916   BUN 21 08/03/2014 0945   CREATININE 0.9 02/01/2015 0916   CREATININE 0.92 08/03/2014 0945      Component Value Date/Time   CALCIUM 9.6 02/01/2015 0916   CALCIUM 9.6 08/03/2014 0945   ALKPHOS 76 02/01/2015 0916   ALKPHOS 61 08/03/2014 0945   AST 25 02/01/2015 0916   AST 26 08/03/2014 0945   ALT 12 02/01/2015 0916   ALT 14 08/03/2014 0945    BILITOT 0.59 02/01/2015 0916   BILITOT 0.4 08/03/2014 0945         Impression and Plan: Ms. Nancy Bell is a 82 year old white female. She has a history of stage I ductal carcinoma the right breast. She was diagnosed back in September of 2011. She underwent aromatase inhibitor therapy. She is off all therapy because of poor tolerance.   So far, there's been no evidence of recurrence. She is taking her vitamin D. I think that this is very important for her.   I will see her back in 6 months.   [name], MD 3/24/201710:32 AM

## 2015-08-03 LAB — VITAMIN D 25 HYDROXY (VIT D DEFICIENCY, FRACTURES): Vitamin D, 25-Hydroxy: 64.8 ng/mL (ref 30.0–100.0)

## 2015-10-14 DIAGNOSIS — D2271 Melanocytic nevi of right lower limb, including hip: Secondary | ICD-10-CM | POA: Diagnosis not present

## 2015-10-14 DIAGNOSIS — Z85828 Personal history of other malignant neoplasm of skin: Secondary | ICD-10-CM | POA: Diagnosis not present

## 2015-10-14 DIAGNOSIS — L570 Actinic keratosis: Secondary | ICD-10-CM | POA: Diagnosis not present

## 2015-10-14 DIAGNOSIS — L720 Epidermal cyst: Secondary | ICD-10-CM | POA: Diagnosis not present

## 2015-10-14 DIAGNOSIS — D0462 Carcinoma in situ of skin of left upper limb, including shoulder: Secondary | ICD-10-CM | POA: Diagnosis not present

## 2015-10-14 DIAGNOSIS — L821 Other seborrheic keratosis: Secondary | ICD-10-CM | POA: Diagnosis not present

## 2015-10-14 DIAGNOSIS — D225 Melanocytic nevi of trunk: Secondary | ICD-10-CM | POA: Diagnosis not present

## 2015-10-14 DIAGNOSIS — Z85820 Personal history of malignant melanoma of skin: Secondary | ICD-10-CM | POA: Diagnosis not present

## 2015-10-14 DIAGNOSIS — L814 Other melanin hyperpigmentation: Secondary | ICD-10-CM | POA: Diagnosis not present

## 2016-01-23 DIAGNOSIS — Z853 Personal history of malignant neoplasm of breast: Secondary | ICD-10-CM | POA: Diagnosis not present

## 2016-01-27 ENCOUNTER — Encounter: Payer: Self-pay | Admitting: Hematology & Oncology

## 2016-01-27 DIAGNOSIS — Z23 Encounter for immunization: Secondary | ICD-10-CM | POA: Diagnosis not present

## 2016-02-07 ENCOUNTER — Other Ambulatory Visit (HOSPITAL_BASED_OUTPATIENT_CLINIC_OR_DEPARTMENT_OTHER): Payer: Medicare Other

## 2016-02-07 ENCOUNTER — Encounter: Payer: Self-pay | Admitting: Hematology & Oncology

## 2016-02-07 ENCOUNTER — Ambulatory Visit (HOSPITAL_BASED_OUTPATIENT_CLINIC_OR_DEPARTMENT_OTHER): Payer: Medicare Other | Admitting: Hematology & Oncology

## 2016-02-07 VITALS — BP 149/75 | HR 97 | Temp 97.4°F | Resp 16 | Ht 61.0 in | Wt 131.0 lb

## 2016-02-07 DIAGNOSIS — Z853 Personal history of malignant neoplasm of breast: Secondary | ICD-10-CM | POA: Diagnosis not present

## 2016-02-07 DIAGNOSIS — N63 Unspecified lump in unspecified breast: Secondary | ICD-10-CM

## 2016-02-07 DIAGNOSIS — T386X5A Adverse effect of antigonadotrophins, antiestrogens, antiandrogens, not elsewhere classified, initial encounter: Secondary | ICD-10-CM | POA: Diagnosis not present

## 2016-02-07 DIAGNOSIS — M818 Other osteoporosis without current pathological fracture: Secondary | ICD-10-CM

## 2016-02-07 LAB — COMPREHENSIVE METABOLIC PANEL
ALT: 14 U/L (ref 0–55)
AST: 25 U/L (ref 5–34)
Albumin: 3.8 g/dL (ref 3.5–5.0)
Alkaline Phosphatase: 98 U/L (ref 40–150)
Anion Gap: 11 mEq/L (ref 3–11)
BUN: 16.5 mg/dL (ref 7.0–26.0)
CO2: 27 mEq/L (ref 22–29)
Calcium: 9.7 mg/dL (ref 8.4–10.4)
Chloride: 105 mEq/L (ref 98–109)
Creatinine: 0.9 mg/dL (ref 0.6–1.1)
EGFR: 55 mL/min/{1.73_m2} — ABNORMAL LOW (ref >90)
Glucose: 129 mg/dl (ref 70–140)
Potassium: 3.6 mEq/L (ref 3.5–5.1)
Sodium: 143 mEq/L (ref 136–145)
Total Bilirubin: 0.49 mg/dL (ref 0.20–1.20)
Total Protein: 7.2 g/dL (ref 6.4–8.3)

## 2016-02-07 LAB — CBC WITH DIFFERENTIAL (CANCER CENTER ONLY)
BASO#: 0.1 10*3/uL (ref 0.0–0.2)
BASO%: 0.6 % (ref 0.0–2.0)
EOS%: 3.6 % (ref 0.0–7.0)
Eosinophils Absolute: 0.3 10*3/uL (ref 0.0–0.5)
HCT: 43.7 % (ref 34.8–46.6)
HGB: 15 g/dL (ref 11.6–15.9)
LYMPH#: 2.3 10*3/uL (ref 0.9–3.3)
LYMPH%: 27.3 % (ref 14.0–48.0)
MCH: 30.4 pg (ref 26.0–34.0)
MCHC: 34.3 g/dL (ref 32.0–36.0)
MCV: 89 fL (ref 81–101)
MONO#: 0.8 10*3/uL (ref 0.1–0.9)
MONO%: 9.5 % (ref 0.0–13.0)
NEUT#: 4.9 10*3/uL (ref 1.5–6.5)
NEUT%: 59 % (ref 39.6–80.0)
Platelets: 255 10*3/uL (ref 145–400)
RBC: 4.94 10*6/uL (ref 3.70–5.32)
RDW: 13.1 % (ref 11.1–15.7)
WBC: 8.3 10*3/uL (ref 3.9–10.0)

## 2016-02-07 NOTE — Progress Notes (Signed)
Hematology and Oncology Follow Up Visit  Nancy [id number] 03/18/1925 82 y.o. 02/07/2016   Principle Diagnosis:  Stage I (T1b N0 M0) infiltrating ductal carcinoma of the right breast.  Current Therapy:   Observation     Interim History:  Nancy Bell is in for followup. She has had a good summer..She has been very busy in her house. She try to clean out her house. This has been somewhat difficult to accomplish.  Next week, she and some of the adult family members will be going to the beach for a week. She is really looking forward to this.  She's had no fever.   She's had no bleeding or bruising. She's had no change in bowel or bladder habits. She's had no rashes. She's had no joint swelling.  She is a little concerned that a mammogram that was done back in September showed that there is some asymmetry in the right breast. She then had a ultrasound done. I do not have the ultrasound report. From what she says, there was a nodule there.  She wants to have a follow up much sooner than 6 months. I understand this. I will see about 3 month follow-up.  Currently, her performance status is ECOG 1.   Medications:  Current Outpatient Prescriptions:  .  ALPRAZolam (XANAX) 0.25 MG tablet, TAKE 1/2 A TABLET AS NEEDED FOR 30 DAYS, Disp: , Rfl: 2 .  aspirin 81 MG tablet, Take 81 mg by mouth every other day. , Disp: , Rfl:  .  Biotin (PA BIOTIN) 1000 MCG tablet, Take 1,000 mcg by mouth 3 (three) times daily., Disp: , Rfl:  .  CALCIUM PO, Take 600 mg by mouth daily. , Disp: , Rfl:  .  Coenzyme Q10 (CO Q 10 PO), Take by mouth.  , Disp: , Rfl:  .  diphenhydrAMINE (BENADRYL) 25 MG tablet, Take 25 mg by mouth at bedtime as needed., Disp: , Rfl:  .  hydrochlorothiazide (HYDRODIURIL) 25 MG tablet, Take 25 mg by mouth Daily. , Disp: , Rfl:  .  lovastatin (MEVACOR) 40 MG tablet, , Disp: , Rfl:  .  Multiple Vitamin (MULTIVITAMIN PO), Take by mouth.  , Disp: , Rfl:  .  Omega-3 Fatty Acids (FISH  OIL PO), Take by mouth.  , Disp: , Rfl:  .  OVER THE COUNTER MEDICATION, potassium, Disp: , Rfl:   Allergies:  Allergies  Allergen Reactions  . Amoxicillin-Pot Clavulanate   . Augmentin [Amoxicillin-Pot Clavulanate] Other (See Comments)    Develops C Diff  . Vicodin [Hydrocodone-Acetaminophen] Other (See Comments)    Did not sleep for 72 hrs    Past Medical History, Surgical history, Social history, and Family History were reviewed and updated.  Review of Systems: As above  Physical Exam:  height is 5\' 1"  (1.549 m) and weight is 131 lb (59.4 kg). Her oral temperature is 97.4 F (36.3 C). Her blood pressure is 149/75 (abnormal) and her pulse is 97. Her respiration is 16.   [name]. Head and neck exam shows no ocular or oral lesions. There is no adenopathy on the neck. There is no scleral icterus. Lungs are clear. Cardiac exam regular rate and rhythm with no murmurs rubs or bruits. Breast exam shows left breast no masses edema or erythema. There is no left axillary adenopathy. Right breast shows a well-healed lumpectomy at the 3:00 position. This is some contraction of the right breast from surgery. No obvious masses noted. There is no right  axillary adenopathy. Abdomen is soft. She has good bowel sounds. There is no fluid wave. Is no palpable hepato- splenomegaly. Back exam shows no tenderness over the spine ribs or hips. There is no osteoporotic changes. Extremities shows no clubbing cyanosis or edema. She's the strength in her muscles. 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 Skin exam shows seborrheic keratoses. Neurological exam is unremarkable.  Lab Results  Component Value Date   WBC 8.3 02/07/2016   HGB 15.0 02/07/2016   HCT 43.7 02/07/2016   MCV 89 02/07/2016   PLT 255 02/07/2016     Chemistry      Component Value Date/Time   NA 142 08/02/2015 0939   K 3.4 (L) 08/02/2015 0939   CL 101 08/03/2014 0945   CO2 28 08/02/2015 0939   BUN 16.0  08/02/2015 0939   CREATININE 1.0 08/02/2015 0939      Component Value Date/Time   CALCIUM 9.7 08/02/2015 0939   ALKPHOS 78 08/02/2015 0939   AST 25 08/02/2015 0939   ALT 13 08/02/2015 0939   BILITOT 0.53 08/02/2015 0939         Impression and Plan: Nancy Bell is a 81 year old white female. She has a history of stage I ductal carcinoma the right breast. She was diagnosed back in September of 2011. She underwent aromatase inhibitor therapy. She is off all therapy because of poor tolerance.   So far, there's been no evidence of recurrence. She is taking her vitamin D. I think that this is very important for her.  We will have to see what the repeat mammogram shows. If there is still a problem, that think she will need to have a biopsy. She is definitely agreeable to this.  I will see her back in 6 months if all goes well.   [name], MD 9/29/201710:05 AM

## 2016-02-08 LAB — VITAMIN D 25 HYDROXY (VIT D DEFICIENCY, FRACTURES): Vitamin D, 25-Hydroxy: 68.5 ng/mL (ref 30.0–100.0)

## 2016-05-22 DIAGNOSIS — M810 Age-related osteoporosis without current pathological fracture: Secondary | ICD-10-CM | POA: Diagnosis not present

## 2016-05-22 DIAGNOSIS — I10 Essential (primary) hypertension: Secondary | ICD-10-CM | POA: Diagnosis not present

## 2016-05-22 DIAGNOSIS — F419 Anxiety disorder, unspecified: Secondary | ICD-10-CM | POA: Diagnosis not present

## 2016-05-22 DIAGNOSIS — Z79899 Other long term (current) drug therapy: Secondary | ICD-10-CM | POA: Diagnosis not present

## 2016-05-22 DIAGNOSIS — E7800 Pure hypercholesterolemia, unspecified: Secondary | ICD-10-CM | POA: Diagnosis not present

## 2016-08-04 DIAGNOSIS — N6489 Other specified disorders of breast: Secondary | ICD-10-CM | POA: Diagnosis not present

## 2016-08-04 DIAGNOSIS — Z853 Personal history of malignant neoplasm of breast: Secondary | ICD-10-CM | POA: Diagnosis not present

## 2016-08-04 DIAGNOSIS — Z09 Encounter for follow-up examination after completed treatment for conditions other than malignant neoplasm: Secondary | ICD-10-CM | POA: Diagnosis not present

## 2016-08-06 ENCOUNTER — Encounter: Payer: Self-pay | Admitting: Hematology & Oncology

## 2016-08-07 ENCOUNTER — Other Ambulatory Visit (HOSPITAL_BASED_OUTPATIENT_CLINIC_OR_DEPARTMENT_OTHER): Payer: Medicare Other

## 2016-08-07 ENCOUNTER — Ambulatory Visit (HOSPITAL_BASED_OUTPATIENT_CLINIC_OR_DEPARTMENT_OTHER): Payer: Medicare Other | Admitting: Hematology & Oncology

## 2016-08-07 VITALS — BP 129/74 | HR 82 | Temp 98.1°F | Resp 17 | Wt 129.0 lb

## 2016-08-07 DIAGNOSIS — Z853 Personal history of malignant neoplasm of breast: Secondary | ICD-10-CM

## 2016-08-07 DIAGNOSIS — E559 Vitamin D deficiency, unspecified: Secondary | ICD-10-CM

## 2016-08-07 DIAGNOSIS — N63 Unspecified lump in unspecified breast: Secondary | ICD-10-CM

## 2016-08-07 LAB — CBC WITH DIFFERENTIAL (CANCER CENTER ONLY)
BASO#: 0.1 10*3/uL (ref 0.0–0.2)
BASO%: 0.7 % (ref 0.0–2.0)
EOS%: 3.5 % (ref 0.0–7.0)
Eosinophils Absolute: 0.3 10*3/uL (ref 0.0–0.5)
HCT: 43.9 % (ref 34.8–46.6)
HGB: 14.8 g/dL (ref 11.6–15.9)
LYMPH#: 2.7 10*3/uL (ref 0.9–3.3)
LYMPH%: 30.2 % (ref 14.0–48.0)
MCH: 29.8 pg (ref 26.0–34.0)
MCHC: 33.7 g/dL (ref 32.0–36.0)
MCV: 89 fL (ref 81–101)
MONO#: 0.8 10*3/uL (ref 0.1–0.9)
MONO%: 9.2 % (ref 0.0–13.0)
NEUT#: 5 10*3/uL (ref 1.5–6.5)
NEUT%: 56.4 % (ref 39.6–80.0)
Platelets: 253 10*3/uL (ref 145–400)
RBC: 4.96 10*6/uL (ref 3.70–5.32)
RDW: 13.5 % (ref 11.1–15.7)
WBC: 8.8 10*3/uL (ref 3.9–10.0)

## 2016-08-07 LAB — COMPREHENSIVE METABOLIC PANEL
ALT: 14 U/L (ref 0–55)
AST: 24 U/L (ref 5–34)
Albumin: 3.9 g/dL (ref 3.5–5.0)
Alkaline Phosphatase: 84 U/L (ref 40–150)
Anion Gap: 10 mEq/L (ref 3–11)
BUN: 23.5 mg/dL (ref 7.0–26.0)
CO2: 29 mEq/L (ref 22–29)
Calcium: 10 mg/dL (ref 8.4–10.4)
Chloride: 104 mEq/L (ref 98–109)
Creatinine: 1 mg/dL (ref 0.6–1.1)
EGFR: 49 mL/min/{1.73_m2} — ABNORMAL LOW (ref >90)
Glucose: 121 mg/dl (ref 70–140)
Potassium: 3.3 mEq/L — ABNORMAL LOW (ref 3.5–5.1)
Sodium: 142 mEq/L (ref 136–145)
Total Bilirubin: 0.5 mg/dL (ref 0.20–1.20)
Total Protein: 7.1 g/dL (ref 6.4–8.3)

## 2016-08-07 NOTE — Progress Notes (Signed)
Hematology and Oncology Follow Up Visit  SHELLY SPENSER [id number] Sep 28, 1924 83 y.o. 08/07/2016   Principle Diagnosis:  Stage I (T1b N0 M0) infiltrating ductal carcinoma of the right breast.  Current Therapy:   Observation     Interim History:  Ms.  Dickard is in for followup. She has had a good winter..She has been very busy in her house. She try to clean out her house. This has been somewhat difficult to accomplish.  She is looking for to Easter. She will be with her family.  She's had no fever.   She's had no bleeding or bruising. She's had no change in bowel or bladder habits. She's had no rashes. She's had no joint swelling.  She is a little concerned that a mammogram that was done back 2 weeks ago. This mammogram showed that there is some asymmetry in the right breast. She then had a ultrasound done. I do not have the ultrasound report. From what she says, there was a nodule there.  Currently, her performance status is ECOG 1.   Medications:  Current Outpatient Prescriptions:  .  ALPRAZolam (XANAX) 0.25 MG tablet, TAKE 1/2 A TABLET AS NEEDED FOR 30 DAYS, Disp: , Rfl: 2 .  aspirin 81 MG tablet, Take 81 mg by mouth every other day. , Disp: , Rfl:  .  Biotin (PA BIOTIN) 1000 MCG tablet, Take 1,000 mcg by mouth 3 (three) times daily., Disp: , Rfl:  .  CALCIUM PO, Take 600 mg by mouth daily. , Disp: , Rfl:  .  Coenzyme Q10 (CO Q 10 PO), Take by mouth.  , Disp: , Rfl:  .  diphenhydrAMINE (BENADRYL) 25 MG tablet, Take 25 mg by mouth at bedtime as needed., Disp: , Rfl:  .  hydrochlorothiazide (HYDRODIURIL) 25 MG tablet, Take 25 mg by mouth Daily. , Disp: , Rfl:  .  lovastatin (MEVACOR) 40 MG tablet, , Disp: , Rfl:  .  Multiple Vitamin (MULTIVITAMIN PO), Take by mouth.  , Disp: , Rfl:  .  Omega-3 Fatty Acids (FISH OIL PO), Take by mouth.  , Disp: , Rfl:  .  OVER THE COUNTER MEDICATION, potassium, Disp: , Rfl:   Allergies:  Allergies  Allergen Reactions  . Amoxicillin-Pot  Clavulanate   . Augmentin [Amoxicillin-Pot Clavulanate] Other (See Comments)    Develops C Diff  . Vicodin [Hydrocodone-Acetaminophen] Other (See Comments)    Did not sleep for 72 hrs    Past Medical History, Surgical history, Social history, and Family History were reviewed and updated.  Review of Systems: As above  Physical Exam:  weight is 129 lb (58.5 kg). Her oral temperature is 98.1 F (36.7 C). Her blood pressure is 129/74 and her pulse is 82. Her respiration is 17 and oxygen saturation is 100%.   [name]. Head and neck exam shows no ocular or oral lesions. There is no adenopathy on the neck. There is no scleral icterus. Lungs are clear. Cardiac exam regular rate and rhythm with no murmurs rubs or bruits. Breast exam shows left breast no masses edema or erythema. There is no left axillary adenopathy. Right breast shows a well-healed lumpectomy at the 3:00 position. This is some contraction of the right breast from surgery. No obvious masses noted. There is no right axillary adenopathy. Abdomen is soft. She has good bowel sounds. There is no fluid wave. Is no palpable hepato- splenomegaly. Back exam shows no tenderness over the spine ribs or hips. There is no osteoporotic changes. Extremities  shows no clubbing cyanosis or edema. She's the strength in her muscles. 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 Skin exam shows seborrheic keratoses. Neurological exam is unremarkable.  Lab Results  Component Value Date   WBC 8.8 08/07/2016   HGB 14.8 08/07/2016   HCT 43.9 08/07/2016   MCV 89 08/07/2016   PLT 253 08/07/2016     Chemistry      Component Value Date/Time   NA 143 02/07/2016 0925   K 3.6 02/07/2016 0925   CL 101 08/03/2014 0945   CO2 27 02/07/2016 0925   BUN 16.5 02/07/2016 0925   CREATININE 0.9 02/07/2016 0925      Component Value Date/Time   CALCIUM 9.7 02/07/2016 0925   ALKPHOS 98 02/07/2016 0925   AST 25 02/07/2016 0925   ALT 14  02/07/2016 0925   BILITOT 0.49 02/07/2016 0925         Impression and Plan: Ms. Hasten is a 83 year old white female. She has a history of stage I ductal carcinoma the right breast. She was diagnosed back in September of 2011. She underwent aromatase inhibitor therapy. She is off all therapy because of poor tolerance.   So far, there's been no evidence of recurrence. She is taking her vitamin D. I think that this is very important for her.  We will have to see what the repeat mammogram shows. If there is still a problem, that think she will need to have a biopsy. She is definitely agreeable to this.  I will see her back in 6 months if all goes well.   [name], MD 3/30/20189:39 AM

## 2016-08-11 ENCOUNTER — Encounter: Payer: Self-pay | Admitting: Hematology & Oncology

## 2017-01-21 DIAGNOSIS — N6002 Solitary cyst of left breast: Secondary | ICD-10-CM | POA: Diagnosis not present

## 2017-01-21 DIAGNOSIS — N6320 Unspecified lump in the left breast, unspecified quadrant: Secondary | ICD-10-CM | POA: Diagnosis not present

## 2017-01-21 DIAGNOSIS — Z853 Personal history of malignant neoplasm of breast: Secondary | ICD-10-CM | POA: Diagnosis not present

## 2017-01-27 ENCOUNTER — Other Ambulatory Visit: Payer: Self-pay | Admitting: Radiology

## 2017-01-27 DIAGNOSIS — N6320 Unspecified lump in the left breast, unspecified quadrant: Secondary | ICD-10-CM | POA: Diagnosis not present

## 2017-01-27 DIAGNOSIS — D242 Benign neoplasm of left breast: Secondary | ICD-10-CM | POA: Diagnosis not present

## 2017-01-27 DIAGNOSIS — Z09 Encounter for follow-up examination after completed treatment for conditions other than malignant neoplasm: Secondary | ICD-10-CM | POA: Diagnosis not present

## 2017-01-27 DIAGNOSIS — D241 Benign neoplasm of right breast: Secondary | ICD-10-CM | POA: Diagnosis not present

## 2017-01-27 DIAGNOSIS — N6323 Unspecified lump in the left breast, lower outer quadrant: Secondary | ICD-10-CM | POA: Diagnosis not present

## 2017-02-03 ENCOUNTER — Encounter: Payer: Self-pay | Admitting: Hematology & Oncology

## 2017-02-05 ENCOUNTER — Encounter: Payer: Self-pay | Admitting: Hematology & Oncology

## 2017-02-08 ENCOUNTER — Ambulatory Visit: Payer: Self-pay | Admitting: Surgery

## 2017-02-08 DIAGNOSIS — N6320 Unspecified lump in the left breast, unspecified quadrant: Secondary | ICD-10-CM | POA: Diagnosis not present

## 2017-02-08 NOTE — H&P (Signed)
Nancy Bell 02/08/2017 9:32 AM Location: [facility] Patient #: [id number] DOB: 1924-10-21 Widowed / Language: Nancy Bell / Race: White Female  History of Present Illness Nancy Bell; 02/08/2017 9:53 AM) Patient words: Patient sent at the request of Dr Nancy Bell for left breast mass detected on recent mammogram. Patient has a history of stage I right breast cancer treated with breast conservation, radiation therapy and observation in 2011. She has no history of recurrence or evidence of recurrence. Her left breast mass that was core biopsied and found to be consistent with a mucocele like lesion. This was 1.1 cm complex cyst in the left breast central. Her left axilla was normal. Patient denies history of breast pain, nipple discharge or mass.  The patient is a 83 year old female.   Past Surgical History Nancy Bell, Nancy Bell; 02/08/2017 9:32 AM) Breast Biopsy Bilateral. Breast Mass; Local Excision Right.  Diagnostic Studies History Nancy Bell, Nancy Bell; 02/08/2017 9:32 AM) Colonoscopy 5-10 years ago Mammogram within last year Pap Smear >5 years ago  Allergies Nancy Bell, RMA; 02/08/2017 9:36 AM) Augmentin *PENICILLINS* Benadryl *ANTIHISTAMINES* Allergies Reconciled  Medication History Nancy Bell, RMA; 02/08/2017 9:35 AM) ALPRAZolam (0.25MG  Tablet, Oral) Active. HydroCHLOROthiazide (25MG  Tablet, Oral) Active. Lovastatin (40MG  Tablet, Oral) Active. Aspirin (81MG  Tablet, Oral) Active. Fish Oil (Oral) Specific strength unknown - Active. Medications Reconciled  Social History Nancy Bell, Nancy Bell; 02/08/2017 9:32 AM) Caffeine use Coffee. No drug use Tobacco use Never smoker.  Family History Nancy Bell, Nancy Bell; 02/08/2017 9:32 AM) Colon Cancer Father.  Pregnancy / Birth History Nancy Bell, Nancy Bell; 02/08/2017 9:32 AM) Age at menarche 26 years. Age of menopause <47 Gravida 3 Maternal age 39-20 Para 3  Other  Problems Nancy Bell, Nancy Bell; 02/08/2017 9:32 AM) Back Pain Breast Cancer High blood pressure Hypercholesterolemia Lump In Breast Melanoma     Review of Systems Nancy Bell RMA; 02/08/2017 9:32 AM) HEENT Present- Seasonal Allergies and Wears glasses/contact lenses. Not Present- Earache, Hearing Loss, Hoarseness, Nose Bleed, Oral Ulcers, Ringing in the Ears, Sinus Pain, Sore Throat, Visual Disturbances and Yellow Eyes. Breast Present- Breast Pain. Not Present- Breast Mass, Nipple Discharge and Skin Changes. Gastrointestinal Present- Bloating and Difficulty Swallowing. Not Present- Abdominal Pain, Bloody Stool, Change in Bowel Habits, Chronic diarrhea, Constipation, Excessive gas, Gets full quickly at meals, Hemorrhoids, Indigestion, Nausea, Rectal Pain and Vomiting. Female Genitourinary Not Present- Frequency, Nocturia, Painful Urination, Pelvic Pain and Urgency. Psychiatric Present- Anxiety. Not Present- Bipolar, Change in Sleep Pattern, Depression, Fearful and Frequent crying. Hematology Present- Easy Bruising. Not Present- Blood Thinners, Excessive bleeding, Gland problems, HIV and Persistent Infections.  Vitals Nancy Bell RMA; 02/08/2017 9:33 AM) 02/08/2017 9:33 AM Weight: 134 lb Height: 61in Body Surface Area: 1.59 m Body Mass Index: 25.32 kg/m  Temp.: 97.12F  Pulse: 91 (Regular)  BP: 160/84 (Sitting, Left Arm, Standard)      Physical Exam (Nancy Bell; 02/08/2017 9:53 AM)  General Mental Status-Alert. General Appearance-Consistent with stated age. Hydration-Well hydrated. Voice-Normal.  Head and Neck Head-normocephalic, atraumatic with no lesions or palpable masses. Trachea-midline. Thyroid Gland Characteristics - normal size and consistency.  Chest and Lung Exam Chest and lung exam reveals -quiet, even and easy respiratory effort with no use of accessory muscles and on auscultation, normal breath sounds, no  adventitious sounds and normal vocal resonance. Inspection Chest Wall - Normal. Back - normal.  Breast Note: Right breast shows postsurgical changes from lumpectomy and radiation therapy. There is no mass. Left breast is normal. No evidence of nipple  discharge bilaterally.  Lymphatic Head & Neck  General Head & Neck Lymphatics: Bilateral - Description - Normal. Axillary  General Axillary Region: Bilateral - Description - Normal. Tenderness - Non Tender.    Assessment & Plan (Nancy Bell; 02/08/2017 9:56 AM)  LEFT BREAST MASS (N63.20) Impression: Discussed observation versus lumpectomy. At her advanced age, she isn't sure if she was to have any more surgery. She is relatively good health I told her that she would do well with lumpectomy. There is a low but finite risk of malignancy in this setting which I discussed with her family. They would like to think things over and let me decide later. I discussed observation vs lumpectomy Risk of lumpectomy include bleeding, infection, seroma, more surgery, use of seed/wire, wound care, cosmetic deformity and the need for other treatments, death , blood clots, death. Pt agrees to proceed.  Current Plans Pt Education - instructions : discussed with patient and provided information. The anatomy and the physiology was discussed. The pathophysiology and natural history of the disease was discussed. Options were discussed and recommendations were made. Technique, risks, benefits, & alternatives were discussed. Risks such as stroke, heart attack, bleeding, indection, death, and other risks discussed. Questions answered. The patient agrees to proceed. Pt Education - [facility] Breast Biopsy HCI: discussed with patient and provided information.

## 2017-02-15 ENCOUNTER — Other Ambulatory Visit (HOSPITAL_BASED_OUTPATIENT_CLINIC_OR_DEPARTMENT_OTHER): Payer: Medicare Other

## 2017-02-15 ENCOUNTER — Ambulatory Visit (HOSPITAL_BASED_OUTPATIENT_CLINIC_OR_DEPARTMENT_OTHER): Payer: Medicare Other | Admitting: Hematology & Oncology

## 2017-02-15 VITALS — BP 142/94 | HR 106 | Temp 97.6°F | Wt 133.8 lb

## 2017-02-15 DIAGNOSIS — Z853 Personal history of malignant neoplasm of breast: Secondary | ICD-10-CM

## 2017-02-15 DIAGNOSIS — E876 Hypokalemia: Secondary | ICD-10-CM

## 2017-02-15 DIAGNOSIS — E559 Vitamin D deficiency, unspecified: Secondary | ICD-10-CM

## 2017-02-15 DIAGNOSIS — Z23 Encounter for immunization: Secondary | ICD-10-CM | POA: Diagnosis not present

## 2017-02-15 LAB — CMP (CANCER CENTER ONLY)
ALT(SGPT): 20 U/L (ref 10–47)
AST: 36 U/L (ref 11–38)
Albumin: 3.7 g/dL (ref 3.3–5.5)
Alkaline Phosphatase: 80 U/L (ref 26–84)
BUN, Bld: 15 mg/dL (ref 7–22)
CO2: 31 mEq/L (ref 18–33)
Calcium: 9.8 mg/dL (ref 8.0–10.3)
Chloride: 105 mEq/L (ref 98–108)
Creat: 1 mg/dl (ref 0.6–1.2)
Glucose, Bld: 138 mg/dL — ABNORMAL HIGH (ref 73–118)
Potassium: 3.2 mEq/L — ABNORMAL LOW (ref 3.3–4.7)
Sodium: 146 mEq/L — ABNORMAL HIGH (ref 128–145)
Total Bilirubin: 0.7 mg/dl (ref 0.20–1.60)
Total Protein: 7.2 g/dL (ref 6.4–8.1)

## 2017-02-15 LAB — CBC WITH DIFFERENTIAL (CANCER CENTER ONLY)
BASO#: 0.1 10*3/uL (ref 0.0–0.2)
BASO%: 0.5 % (ref 0.0–2.0)
EOS%: 2.7 % (ref 0.0–7.0)
Eosinophils Absolute: 0.3 10*3/uL (ref 0.0–0.5)
HCT: 44.2 % (ref 34.8–46.6)
HGB: 15.2 g/dL (ref 11.6–15.9)
LYMPH#: 2.7 10*3/uL (ref 0.9–3.3)
LYMPH%: 29.6 % (ref 14.0–48.0)
MCH: 30.5 pg (ref 26.0–34.0)
MCHC: 34.4 g/dL (ref 32.0–36.0)
MCV: 89 fL (ref 81–101)
MONO#: 0.8 10*3/uL (ref 0.1–0.9)
MONO%: 8.2 % (ref 0.0–13.0)
NEUT#: 5.4 10*3/uL (ref 1.5–6.5)
NEUT%: 59 % (ref 39.6–80.0)
Platelets: 246 10*3/uL (ref 145–400)
RBC: 4.99 10*6/uL (ref 3.70–5.32)
RDW: 13.2 % (ref 11.1–15.7)
WBC: 9.1 10*3/uL (ref 3.9–10.0)

## 2017-02-15 MED ORDER — POTASSIUM CHLORIDE CRYS ER 20 MEQ PO TBCR: 20.0000 meq | EXTENDED_RELEASE_TABLET | Freq: Every day | ORAL | 6 refills | Status: DC

## 2017-02-15 MED ORDER — INFLUENZA VAC SPLIT QUAD 0.5 ML IM SUSY
0.5000 mL | PREFILLED_SYRINGE | Freq: Once | INTRAMUSCULAR | Status: AC
  Administered 2017-02-15: 0.5 mL via INTRAMUSCULAR

## 2017-02-15 NOTE — Progress Notes (Signed)
Hematology and Oncology Follow Up Visit  Nancy Bell [id number] 07/19/1924 83 y.o. 02/15/2017   Principle Diagnosis:  Stage I (T1b N0 M0) infiltrating ductal carcinoma of the right breast.  Current Therapy:   Observation     Interim History:  Ms.  Nancy Bell is in for followup. She is doing okay. She actually had a biopsy of a left breast lesion that was seen on mammogram. This biopsy was done on September 19. The mammogram was done at [facility]. This showed a 1.1 cm and a 7 mm lesion in the left breast.  A biopsy was done on September 19. The pathology report ([id number]) showed a mucocele-like lesion. Nothing looked malignant.  She saw a Psychologist, sport and exercise. There was no indication for surgical resection.  Otherwise she is doing well. She's had no nausea or vomiting. She has had no cough. There's been no shortness of breath. She is walking quite a bit. She's had no change in bowel or bladder habits.  Overall, her performance status is ECOG 0    Medications:  Current Outpatient Prescriptions:  .  ALPRAZolam (XANAX) 0.25 MG tablet, TAKE 1/2 A TABLET AS NEEDED FOR 30 DAYS, Disp: , Rfl: 2 .  aspirin 81 MG tablet, Take 81 mg by mouth every other day. , Disp: , Rfl:  .  Biotin (PA BIOTIN) 1000 MCG tablet, Take 1,000 mcg by mouth 3 (three) times daily., Disp: , Rfl:  .  CALCIUM PO, Take 600 mg by mouth daily. , Disp: , Rfl:  .  Coenzyme Q10 (CO Q 10 PO), Take by mouth.  , Disp: , Rfl:  .  diphenhydrAMINE (BENADRYL) 25 MG tablet, Take 25 mg by mouth at bedtime as needed., Disp: , Rfl:  .  hydrochlorothiazide (HYDRODIURIL) 25 MG tablet, Take 25 mg by mouth Daily. , Disp: , Rfl:  .  lovastatin (MEVACOR) 40 MG tablet, , Disp: , Rfl:  .  Multiple Vitamin (MULTIVITAMIN PO), Take by mouth.  , Disp: , Rfl:  .  Omega-3 Fatty Acids (FISH OIL PO), Take by mouth.  , Disp: , Rfl:  .  OVER THE COUNTER MEDICATION, potassium, Disp: , Rfl:  .  potassium chloride SA (K-DUR,KLOR-CON) 20 MEQ tablet, Take 1 tablet (20  mEq total) by mouth daily., Disp: 30 tablet, Rfl: 6  Current Facility-Administered Medications:  .  Influenza vac split quadrivalent PF (FLUARIX) injection 0.5 mL, 0.5 mL, Intramuscular, Once, Tinaya Ceballos, Rudell Cobb, MD  Allergies:  Allergies  Allergen Reactions  . Amoxicillin-Pot Clavulanate   . Augmentin [Amoxicillin-Pot Clavulanate] Other (See Comments)    Develops C Diff  . Vicodin [Hydrocodone-Acetaminophen] Other (See Comments)    Did not sleep for 72 hrs    Past Medical History, Surgical history, Social history, and Family History were reviewed and updated.  Review of Systems: As stated in the interim history  Physical Exam:  weight is 133 lb 12.8 oz (60.7 kg). Her oral temperature is 97.6 F (36.4 C). Her blood pressure is 142/94 (abnormal) and her pulse is 106 (abnormal).   I examined Nancy Bell. There is also my examination are noted below with appropriate changes:   [name]. Head and neck exam shows no ocular or oral lesions. There is no adenopathy on the neck. There is no scleral icterus. Lungs are clear. Cardiac exam regular rate and rhythm with no murmurs rubs or bruits. Breast exam shows left breast no masses edema or erythema. The biopsy site at the 5:00 position is a well-healed. There is no  left axillary adenopathy. Right breast shows a well-healed lumpectomy at the 3:00 position. This is some contraction of the right breast from surgery. No obvious masses noted. There is no right axillary adenopathy. Abdomen is soft. She has good bowel sounds. There is no fluid wave. Is no palpable hepato- splenomegaly. Back exam shows no tenderness over the spine ribs or hips. There is no osteoporotic changes. Extremities shows no clubbing cyanosis or edema. She's the strength in her muscles. 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 Skin exam shows seborrheic keratoses. Neurological exam is unremarkable.  Lab Results  Component Value Date   WBC 9.1  02/15/2017   HGB 15.2 02/15/2017   HCT 44.2 02/15/2017   MCV 89 02/15/2017   PLT 246 02/15/2017     Chemistry      Component Value Date/Time   NA 146 (H) 02/15/2017 1008   NA 142 08/07/2016 0858   K 3.2 (L) 02/15/2017 1008   K 3.3 (L) 08/07/2016 0858   CL 105 02/15/2017 1008   CO2 31 02/15/2017 1008   CO2 29 08/07/2016 0858   BUN 15 02/15/2017 1008   BUN 23.5 08/07/2016 0858   CREATININE 1.0 02/15/2017 1008   CREATININE 1.0 08/07/2016 0858      Component Value Date/Time   CALCIUM 9.8 02/15/2017 1008   CALCIUM 10.0 08/07/2016 0858   ALKPHOS 80 02/15/2017 1008   ALKPHOS 84 08/07/2016 0858   AST 36 02/15/2017 1008   AST 24 08/07/2016 0858   ALT 20 02/15/2017 1008   ALT 14 08/07/2016 0858   BILITOT 0.70 02/15/2017 1008   BILITOT 0.50 08/07/2016 0858         Impression and Plan: Nancy Bell is a 83 year old white female. She has a history of stage I ductal carcinoma the right breast. She was diagnosed back in September of 2011. She underwent aromatase inhibitor therapy. She is off all therapy because of poor tolerance.   I am very glad that this lesion in the left breast is not malignant.  For right now, I will plan to see her back in 6 more months. I think this would be reasonable.   [name], MD 10/8/201811:28 AM

## 2017-02-16 ENCOUNTER — Telehealth: Payer: Self-pay | Admitting: *Deleted

## 2017-02-16 LAB — VITAMIN D 25 HYDROXY (VIT D DEFICIENCY, FRACTURES): Vitamin D, 25-Hydroxy: 61.8 ng/mL (ref 30.0–100.0)

## 2017-02-16 NOTE — Telephone Encounter (Addendum)
Patient is aware of results  ----- Message from [name], MD sent at 02/16/2017  6:01 AM EDT ----- Call - vit D level is ok!!   pete

## 2017-05-06 DIAGNOSIS — R200 Anesthesia of skin: Secondary | ICD-10-CM | POA: Diagnosis not present

## 2017-05-06 DIAGNOSIS — R1310 Dysphagia, unspecified: Secondary | ICD-10-CM | POA: Diagnosis not present

## 2017-05-10 ENCOUNTER — Other Ambulatory Visit: Payer: Self-pay | Admitting: Family Medicine

## 2017-05-10 DIAGNOSIS — R1310 Dysphagia, unspecified: Secondary | ICD-10-CM

## 2017-05-20 ENCOUNTER — Ambulatory Visit
Admission: RE | Admit: 2017-05-20 | Discharge: 2017-05-20 | Disposition: A | Discharge status: Home / Self Care | Payer: Medicare Other | Source: Ambulatory Visit | Attending: Family Medicine | Admitting: Family Medicine

## 2017-05-20 DIAGNOSIS — K449 Diaphragmatic hernia without obstruction or gangrene: Secondary | ICD-10-CM | POA: Diagnosis not present

## 2017-05-20 DIAGNOSIS — R1310 Dysphagia, unspecified: Secondary | ICD-10-CM

## 2017-06-08 DIAGNOSIS — F419 Anxiety disorder, unspecified: Secondary | ICD-10-CM | POA: Diagnosis not present

## 2017-06-08 DIAGNOSIS — E7800 Pure hypercholesterolemia, unspecified: Secondary | ICD-10-CM | POA: Diagnosis not present

## 2017-06-08 DIAGNOSIS — I10 Essential (primary) hypertension: Secondary | ICD-10-CM | POA: Diagnosis not present

## 2017-06-08 DIAGNOSIS — E876 Hypokalemia: Secondary | ICD-10-CM | POA: Diagnosis not present

## 2017-06-18 DIAGNOSIS — E876 Hypokalemia: Secondary | ICD-10-CM | POA: Diagnosis not present

## 2017-08-17 ENCOUNTER — Inpatient Hospital Stay: Payer: Medicare Other | Attending: Hematology & Oncology | Admitting: Hematology & Oncology

## 2017-08-17 ENCOUNTER — Inpatient Hospital Stay: Payer: Medicare Other

## 2017-08-17 ENCOUNTER — Other Ambulatory Visit: Payer: Self-pay

## 2017-08-17 VITALS — BP 127/41 | HR 85 | Temp 97.9°F | Resp 20 | Wt 133.5 lb

## 2017-08-17 DIAGNOSIS — Z23 Encounter for immunization: Secondary | ICD-10-CM

## 2017-08-17 DIAGNOSIS — Z853 Personal history of malignant neoplasm of breast: Secondary | ICD-10-CM | POA: Diagnosis not present

## 2017-08-17 DIAGNOSIS — E876 Hypokalemia: Secondary | ICD-10-CM

## 2017-08-17 LAB — CBC WITH DIFFERENTIAL (CANCER CENTER ONLY)
Basophils Absolute: 0.1 10*3/uL (ref 0.0–0.1)
Basophils Relative: 1 %
Eosinophils Absolute: 0.2 10*3/uL (ref 0.0–0.5)
Eosinophils Relative: 2 %
HCT: 43.8 % (ref 34.8–46.6)
Hemoglobin: 14.7 g/dL (ref 11.6–15.9)
Lymphocytes Relative: 35 %
Lymphs Abs: 2.9 10*3/uL (ref 0.9–3.3)
MCH: 30.1 pg (ref 26.0–34.0)
MCHC: 33.6 g/dL (ref 32.0–36.0)
MCV: 89.6 fL (ref 81.0–101.0)
Monocytes Absolute: 0.7 10*3/uL (ref 0.1–0.9)
Monocytes Relative: 9 %
Neutro Abs: 4.4 10*3/uL (ref 1.5–6.5)
Neutrophils Relative %: 53 %
Platelet Count: 240 10*3/uL (ref 145–400)
RBC: 4.89 MIL/uL (ref 3.70–5.32)
RDW: 13.2 % (ref 11.1–15.7)
WBC Count: 8.2 10*3/uL (ref 3.9–10.0)

## 2017-08-17 LAB — CMP (CANCER CENTER ONLY)
ALT: 17 U/L (ref 0–55)
AST: 30 U/L (ref 5–34)
Albumin: 3.9 g/dL (ref 3.5–5.0)
Alkaline Phosphatase: 80 U/L (ref 40–150)
Anion gap: 10 (ref 3–11)
BUN: 14 mg/dL (ref 7–26)
CO2: 29 mmol/L (ref 22–29)
Calcium: 10.1 mg/dL (ref 8.4–10.4)
Chloride: 103 mmol/L (ref 98–109)
Creatinine: 0.94 mg/dL (ref 0.60–1.10)
GFR, Est AFR Am: >60 mL/min (ref >60)
GFR, Estimated: 52 mL/min — ABNORMAL LOW (ref >60)
Glucose, Bld: 128 mg/dL (ref 70–140)
Potassium: 3.4 mmol/L — ABNORMAL LOW (ref 3.5–5.1)
Sodium: 142 mmol/L (ref 136–145)
Total Bilirubin: 0.5 mg/dL (ref 0.2–1.2)
Total Protein: 7.1 g/dL (ref 6.4–8.3)

## 2017-08-17 NOTE — Progress Notes (Signed)
Hematology and Oncology Follow Up Visit  Nancy Bell [id number] [birth year] 84 y.o. 08/17/2017   Principle Diagnosis:  Stage I (T1b N0 M0) infiltrating ductal carcinoma of the right breast.  Current Therapy:   Observation     Interim History:  Nancy Bell is in for followup. She is doing okay.  She got through all of the holidays without any difficulties.  She feels well.  She is under a lot of stress because of taxes.  Apparently, there was an issue with her taxes.  She will get an extension.  She showed me of nice picture of part of her family.  It really was nice.  She has had no fever.  She has had no rashes.  She has had no cough or shortness of breath.  She has had no leg swelling.  She is had no change in bowel or bladder habits.  Overall, her performance status is ECOG 0  Medications:  Current Outpatient Medications:  .  ALPRAZolam (XANAX) 0.25 MG tablet, TAKE 1/2 A TABLET AS NEEDED FOR 30 DAYS, Disp: , Rfl: 2 .  aspirin 81 MG tablet, Take 81 mg by mouth every other day. , Disp: , Rfl:  .  Biotin (PA BIOTIN) 1000 MCG tablet, Take 1,000 mcg by mouth 3 (three) times daily., Disp: , Rfl:  .  CALCIUM PO, Take 600 mg by mouth daily. , Disp: , Rfl:  .  cholecalciferol (VITAMIN D) 1000 units tablet, Take 1,000 Units by mouth daily., Disp: , Rfl:  .  Coenzyme Q10 (CO Q 10 PO), Take by mouth.  , Disp: , Rfl:  .  diphenhydrAMINE (BENADRYL) 25 MG tablet, Take 25 mg by mouth at bedtime as needed., Disp: , Rfl:  .  hydrochlorothiazide (HYDRODIURIL) 25 MG tablet, Take 25 mg by mouth Daily. , Disp: , Rfl:  .  lovastatin (MEVACOR) 40 MG tablet, , Disp: , Rfl:  .  Multiple Vitamin (MULTIVITAMIN PO), Take by mouth.  , Disp: , Rfl:  .  Omega-3 Fatty Acids (FISH OIL PO), Take by mouth.  , Disp: , Rfl:  .  OVER THE COUNTER MEDICATION, Potassium 20 meq daily- takes over the counter.  Unable to swallow script K, Disp: , Rfl:  .  potassium chloride SA (K-DUR,KLOR-CON) 20 MEQ tablet, Take 1  tablet (20 mEq total) by mouth daily., Disp: 30 tablet, Rfl: 6  Allergies:  Allergies  Allergen Reactions  . Amoxicillin-Pot Clavulanate   . Augmentin [Amoxicillin-Pot Clavulanate] Other (See Comments)    Develops C Diff  . Vicodin [Hydrocodone-Acetaminophen] Other (See Comments)    Did not sleep for 72 hrs    Past Medical History, Surgical history, Social history, and Family History were reviewed and updated.  Review of Systems: As stated in the interim history  Physical Exam:  weight is 133 lb 8 oz (60.6 kg). Her oral temperature is 97.9 F (36.6 C). Her blood pressure is 127/41 (abnormal) and her pulse is 85. Her respiration is 20 and oxygen saturation is 95%.   I examined Nancy Bell. There is also my examination are noted below with appropriate changes:   [name]. Head and neck exam shows no ocular or oral lesions. There is no adenopathy on the neck. There is no scleral icterus. Lungs are clear. Cardiac exam regular rate and rhythm with no murmurs rubs or bruits. Breast exam shows left breast no masses edema or erythema. The biopsy site at the 5:00 position is a well-healed. There is no  left axillary adenopathy. Right breast shows a well-healed lumpectomy at the 3:00 position. This is some contraction of the right breast from surgery. No obvious masses noted. There is no right axillary adenopathy. Abdomen is soft. She has good bowel sounds. There is no fluid wave. Is no palpable hepato- splenomegaly. Back exam shows no tenderness over the spine ribs or hips. There is no osteoporotic changes. Extremities shows no clubbing cyanosis or edema. She's the strength in her muscles. 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 Skin exam shows seborrheic keratoses. Neurological exam is unremarkable.  Lab Results  Component Value Date   WBC 8.2 08/17/2017   HGB 15.2 02/15/2017   HCT 43.8 08/17/2017   MCV 89.6 08/17/2017   PLT 240 08/17/2017     Chemistry       Component Value Date/Time   NA 146 (H) 02/15/2017 1008   NA 142 08/07/2016 0858   K 3.2 (L) 02/15/2017 1008   K 3.3 (L) 08/07/2016 0858   CL 105 02/15/2017 1008   CO2 31 02/15/2017 1008   CO2 29 08/07/2016 0858   BUN 15 02/15/2017 1008   BUN 23.5 08/07/2016 0858   CREATININE 1.0 02/15/2017 1008   CREATININE 1.0 08/07/2016 0858      Component Value Date/Time   CALCIUM 9.8 02/15/2017 1008   CALCIUM 10.0 08/07/2016 0858   ALKPHOS 80 02/15/2017 1008   ALKPHOS 84 08/07/2016 0858   AST 36 02/15/2017 1008   AST 24 08/07/2016 0858   ALT 20 02/15/2017 1008   ALT 14 08/07/2016 0858   BILITOT 0.70 02/15/2017 1008   BILITOT 0.50 08/07/2016 0858         Impression and Plan: Nancy Bell is a 84 year old white female. She has a history of stage I ductal carcinoma the right breast. She was diagnosed back in September of 2011. She underwent aromatase inhibitor therapy. She is off all therapy because of poor tolerance.   For right now, everything looks fantastic.  I do not see any problems.  It is now been 7 1/2 years that she had her diagnosis made.  I will plan to see her back in another 6 months.   [name], MD 4/9/201910:53 AM

## 2017-08-18 ENCOUNTER — Telehealth: Payer: Self-pay | Admitting: *Deleted

## 2017-08-18 NOTE — Telephone Encounter (Addendum)
Patient is aware of results  ----- Message from [name], MD sent at 08/17/2017  2:43 PM EDT ----- Call - labs look ok.  The potassium is a little low.  Eat foods with K+!!  Nancy Bell

## 2018-01-22 DIAGNOSIS — Z23 Encounter for immunization: Secondary | ICD-10-CM | POA: Diagnosis not present

## 2018-02-15 ENCOUNTER — Inpatient Hospital Stay: Payer: Medicare Other

## 2018-02-15 ENCOUNTER — Other Ambulatory Visit: Payer: Self-pay

## 2018-02-15 ENCOUNTER — Inpatient Hospital Stay: Payer: Medicare Other | Attending: Hematology & Oncology | Admitting: Family

## 2018-02-15 ENCOUNTER — Encounter: Payer: Self-pay | Admitting: Family

## 2018-02-15 VITALS — BP 171/69 | HR 86 | Temp 98.0°F | Resp 18 | Wt 132.0 lb

## 2018-02-15 DIAGNOSIS — Z853 Personal history of malignant neoplasm of breast: Secondary | ICD-10-CM

## 2018-02-15 LAB — CMP (CANCER CENTER ONLY)
ALT: 15 U/L (ref 0–44)
AST: 29 U/L (ref 15–41)
Albumin: 4 g/dL (ref 3.5–5.0)
Alkaline Phosphatase: 90 U/L (ref 38–126)
Anion gap: 11 (ref 5–15)
BUN: 12 mg/dL (ref 8–23)
CO2: 30 mmol/L (ref 22–32)
Calcium: 9.9 mg/dL (ref 8.9–10.3)
Chloride: 101 mmol/L (ref 98–111)
Creatinine: 0.97 mg/dL (ref 0.44–1.00)
GFR, Est AFR Am: 57 mL/min — ABNORMAL LOW (ref >60)
GFR, Estimated: 50 mL/min — ABNORMAL LOW (ref >60)
Glucose, Bld: 154 mg/dL — ABNORMAL HIGH (ref 70–99)
Potassium: 3.3 mmol/L — ABNORMAL LOW (ref 3.5–5.1)
Sodium: 142 mmol/L (ref 135–145)
Total Bilirubin: 0.6 mg/dL (ref 0.3–1.2)
Total Protein: 7.4 g/dL (ref 6.5–8.1)

## 2018-02-15 LAB — CBC WITH DIFFERENTIAL (CANCER CENTER ONLY)
Abs Immature Granulocytes: 0.03 10*3/uL (ref 0.00–0.07)
Basophils Absolute: 0.1 10*3/uL (ref 0.0–0.1)
Basophils Relative: 1 %
Eosinophils Absolute: 0.1 10*3/uL (ref 0.0–0.5)
Eosinophils Relative: 2 %
HCT: 46.8 % — ABNORMAL HIGH (ref 36.0–46.0)
Hemoglobin: 15.1 g/dL — ABNORMAL HIGH (ref 12.0–15.0)
Immature Granulocytes: 0 %
Lymphocytes Relative: 32 %
Lymphs Abs: 2.7 10*3/uL (ref 0.7–4.0)
MCH: 28.9 pg (ref 26.0–34.0)
MCHC: 32.3 g/dL (ref 30.0–36.0)
MCV: 89.7 fL (ref 80.0–100.0)
Monocytes Absolute: 0.8 10*3/uL (ref 0.1–1.0)
Monocytes Relative: 9 %
Neutro Abs: 4.7 10*3/uL (ref 1.7–7.7)
Neutrophils Relative %: 56 %
Platelet Count: 250 10*3/uL (ref 150–400)
RBC: 5.22 MIL/uL — ABNORMAL HIGH (ref 3.87–5.11)
RDW: 12.6 % (ref 11.5–15.5)
WBC Count: 8.3 10*3/uL (ref 4.0–10.5)
nRBC: 0 % (ref 0.0–0.2)

## 2018-02-15 NOTE — Progress Notes (Signed)
Hematology and Oncology Follow Up Visit  TAMETRIA AHO [id number] December 01, 1924 84 y.o. 02/15/2018   Principle Diagnosis:  Stage I (T1b N0 M0) infiltrating ductal carcinoma of the right breast  Current Therapy:   Observation   Interim History:  Ms. Nancy Bell is here today for follow-up. She is doing well and has no complaints at this time. She has put her mammogram for this year on hold due to some home issues but plans to schedule herself as soon as things are less stressful.  Breast exam today was negative. No mass, lesion or rash noted.  No fever chills, n/v, cough, rash, dizziness, SOB, chest pain, palpitations, abdominal pain or changes in bowel or bladder habits.  No swelling, tenderness, numbness or tingling in her extremities.  No lymphadenopathy noted on exam.  She has a good appetite and is staying well hydrated. Her weight is stable.   ECOG Performance Status: 0 - Asymptomatic  Medications:  Allergies as of 02/15/2018      Reactions   Amoxicillin-pot Clavulanate    Augmentin [amoxicillin-pot Clavulanate] Other (See Comments)   Develops C Diff   Vicodin [hydrocodone-acetaminophen] Other (See Comments)   Did not sleep for 72 hrs      Medication List        Accurate as of 02/15/18 10:21 AM. Always use your most recent med list.          ALPRAZolam 0.25 MG tablet Commonly known as:  XANAX TAKE 1/2 A TABLET AS NEEDED FOR 30 DAYS   aspirin 81 MG tablet Take 81 mg by mouth every other day.   CALCIUM PO Take 600 mg by mouth daily.   cholecalciferol 1000 units tablet Commonly known as:  VITAMIN D Take 1,000 Units by mouth daily.   CO Q 10 PO Take by mouth.   diphenhydrAMINE 25 MG tablet Commonly known as:  BENADRYL Take 25 mg by mouth at bedtime as needed.   FISH OIL PO Take by mouth.   hydrochlorothiazide 25 MG tablet Commonly known as:  HYDRODIURIL Take 25 mg by mouth Daily.   lovastatin 40 MG tablet Commonly known as:  MEVACOR   MULTIVITAMIN  PO Take by mouth.   OVER THE COUNTER MEDICATION Potassium 20 meq daily- takes over the counter.  Unable to swallow script K   PA BIOTIN 1000 MCG tablet Generic drug:  Biotin Take 1,000 mcg by mouth 3 (three) times daily.   potassium chloride SA 20 MEQ tablet Commonly known as:  K-DUR,KLOR-CON Take 1 tablet (20 mEq total) by mouth daily.       Allergies:  Allergies  Allergen Reactions  . Amoxicillin-Pot Clavulanate   . Augmentin [Amoxicillin-Pot Clavulanate] Other (See Comments)    Develops C Diff  . Vicodin [Hydrocodone-Acetaminophen] Other (See Comments)    Did not sleep for 72 hrs    Past Medical History, Surgical history, Social history, and Family History were reviewed and updated.  Review of Systems: All other 10 point review of systems is negative.   Physical Exam:  vitals were not taken for this visit.   Wt Readings from Last 3 Encounters:  08/17/17 133 lb 8 oz (60.6 kg)  02/15/17 133 lb 12.8 oz (60.7 kg)  08/07/16 129 lb (58.5 kg)    Ocular: Sclerae unicteric, pupils equal, round and reactive to light Ear-nose-throat: Oropharynx clear, dentition fair Lymphatic: No cervical, supraclavicular or axillary adenopathy Lungs no rales or rhonchi, good excursion bilaterally Heart regular rate and rhythm, no murmur appreciated Abd soft, nontender,  positive bowel sounds, no liver or spleen tip palpated on exam, no fluid wave  MSK no focal spinal tenderness, no joint edema Neuro: non-focal, well-oriented, appropriate affect Breasts: No changes. No mass, lesion or rash noted.   Lab Results  Component Value Date   WBC 8.2 08/17/2017   HGB 14.7 08/17/2017   HCT 43.8 08/17/2017   MCV 89.6 08/17/2017   PLT 240 08/17/2017   No results found for: FERRITIN, IRON, TIBC, UIBC, IRONPCTSAT Lab Results  Component Value Date   RBC 4.89 08/17/2017   No results found for: KPAFRELGTCHN, LAMBDASER, KAPLAMBRATIO No results found for: IGGSERUM, IGA, IGMSERUM No results found  for: Odetta Pink, SPEI   Chemistry      Component Value Date/Time   NA 142 08/17/2017 0954   NA 146 (H) 02/15/2017 1008   NA 142 08/07/2016 0858   K 3.4 (L) 08/17/2017 0954   K 3.2 (L) 02/15/2017 1008   K 3.3 (L) 08/07/2016 0858   CL 103 08/17/2017 0954   CL 105 02/15/2017 1008   CO2 29 08/17/2017 0954   CO2 31 02/15/2017 1008   CO2 29 08/07/2016 0858   BUN 14 08/17/2017 0954   BUN 15 02/15/2017 1008   BUN 23.5 08/07/2016 0858   CREATININE 0.94 08/17/2017 0954   CREATININE 1.0 02/15/2017 1008   CREATININE 1.0 08/07/2016 0858      Component Value Date/Time   CALCIUM 10.1 08/17/2017 0954   CALCIUM 9.8 02/15/2017 1008   CALCIUM 10.0 08/07/2016 0858   ALKPHOS 80 08/17/2017 0954   ALKPHOS 80 02/15/2017 1008   ALKPHOS 84 08/07/2016 0858   AST 30 08/17/2017 0954   AST 24 08/07/2016 0858   ALT 17 08/17/2017 0954   ALT 20 02/15/2017 1008   ALT 14 08/07/2016 0858   BILITOT 0.5 08/17/2017 0954   BILITOT 0.50 08/07/2016 0858      Impression and Plan: Ms. [name] is a very pleasant 84 yo caucasian female with history of stage I ductal carcinoma of the right breast diagnosed in September 2011. She discontinued her aromatase inhibitor due to intolerance. She continues to do well and so far there has been no evidence of recurrence. She promises to schedule her mammogram when she can.  We will plan to see her back in another 6 months. She will contact our office with any questions or concerns. We can certainly see her sooner if need be.   Laverna Peace, NP 10/8/201910:21 AM

## 2018-06-14 DIAGNOSIS — E7800 Pure hypercholesterolemia, unspecified: Secondary | ICD-10-CM | POA: Diagnosis not present

## 2018-06-14 DIAGNOSIS — I10 Essential (primary) hypertension: Secondary | ICD-10-CM | POA: Diagnosis not present

## 2018-06-14 DIAGNOSIS — R7301 Impaired fasting glucose: Secondary | ICD-10-CM | POA: Diagnosis not present

## 2018-06-14 DIAGNOSIS — M810 Age-related osteoporosis without current pathological fracture: Secondary | ICD-10-CM | POA: Diagnosis not present

## 2018-06-14 DIAGNOSIS — K449 Diaphragmatic hernia without obstruction or gangrene: Secondary | ICD-10-CM | POA: Diagnosis not present

## 2018-06-14 DIAGNOSIS — F419 Anxiety disorder, unspecified: Secondary | ICD-10-CM | POA: Diagnosis not present

## 2018-06-14 DIAGNOSIS — Z79899 Other long term (current) drug therapy: Secondary | ICD-10-CM | POA: Diagnosis not present

## 2018-08-17 ENCOUNTER — Other Ambulatory Visit: Payer: Medicare Other

## 2018-08-17 ENCOUNTER — Ambulatory Visit: Payer: Medicare Other | Admitting: Hematology & Oncology

## 2018-10-12 ENCOUNTER — Ambulatory Visit: Payer: Medicare Other | Admitting: Hematology & Oncology

## 2018-10-12 ENCOUNTER — Other Ambulatory Visit: Payer: Medicare Other

## 2019-02-15 DIAGNOSIS — Z23 Encounter for immunization: Secondary | ICD-10-CM | POA: Diagnosis not present

## 2019-04-03 DIAGNOSIS — F419 Anxiety disorder, unspecified: Secondary | ICD-10-CM | POA: Diagnosis not present

## 2019-04-03 DIAGNOSIS — I10 Essential (primary) hypertension: Secondary | ICD-10-CM | POA: Diagnosis not present

## 2019-04-03 DIAGNOSIS — E7800 Pure hypercholesterolemia, unspecified: Secondary | ICD-10-CM | POA: Diagnosis not present

## 2019-10-04 DIAGNOSIS — R7309 Other abnormal glucose: Secondary | ICD-10-CM | POA: Diagnosis not present

## 2019-10-04 DIAGNOSIS — Z0001 Encounter for general adult medical examination with abnormal findings: Secondary | ICD-10-CM | POA: Diagnosis not present

## 2019-10-04 DIAGNOSIS — I10 Essential (primary) hypertension: Secondary | ICD-10-CM | POA: Diagnosis not present

## 2019-10-04 DIAGNOSIS — M810 Age-related osteoporosis without current pathological fracture: Secondary | ICD-10-CM | POA: Diagnosis not present

## 2019-10-04 DIAGNOSIS — E7800 Pure hypercholesterolemia, unspecified: Secondary | ICD-10-CM | POA: Diagnosis not present

## 2019-10-04 DIAGNOSIS — F419 Anxiety disorder, unspecified: Secondary | ICD-10-CM | POA: Diagnosis not present

## 2019-10-04 DIAGNOSIS — Z79899 Other long term (current) drug therapy: Secondary | ICD-10-CM | POA: Diagnosis not present

## 2020-02-12 DIAGNOSIS — Z23 Encounter for immunization: Secondary | ICD-10-CM | POA: Diagnosis not present

## 2020-03-08 DIAGNOSIS — Z23 Encounter for immunization: Secondary | ICD-10-CM | POA: Diagnosis not present

## 2020-04-10 DIAGNOSIS — Z79899 Other long term (current) drug therapy: Secondary | ICD-10-CM | POA: Diagnosis not present

## 2020-04-10 DIAGNOSIS — R7309 Other abnormal glucose: Secondary | ICD-10-CM | POA: Diagnosis not present

## 2020-04-10 DIAGNOSIS — F419 Anxiety disorder, unspecified: Secondary | ICD-10-CM | POA: Diagnosis not present

## 2020-04-10 DIAGNOSIS — M810 Age-related osteoporosis without current pathological fracture: Secondary | ICD-10-CM | POA: Diagnosis not present

## 2020-04-10 DIAGNOSIS — L659 Nonscarring hair loss, unspecified: Secondary | ICD-10-CM | POA: Diagnosis not present

## 2020-04-10 DIAGNOSIS — E7800 Pure hypercholesterolemia, unspecified: Secondary | ICD-10-CM | POA: Diagnosis not present

## 2020-04-10 DIAGNOSIS — R739 Hyperglycemia, unspecified: Secondary | ICD-10-CM | POA: Diagnosis not present

## 2020-04-10 DIAGNOSIS — I10 Essential (primary) hypertension: Secondary | ICD-10-CM | POA: Diagnosis not present

## 2020-04-25 DIAGNOSIS — E876 Hypokalemia: Secondary | ICD-10-CM | POA: Diagnosis not present

## 2020-08-19 ENCOUNTER — Other Ambulatory Visit: Payer: Self-pay | Admitting: Family

## 2020-08-19 DIAGNOSIS — Z853 Personal history of malignant neoplasm of breast: Secondary | ICD-10-CM

## 2020-08-20 ENCOUNTER — Telehealth: Payer: Self-pay

## 2020-08-20 ENCOUNTER — Other Ambulatory Visit: Payer: Self-pay

## 2020-08-20 ENCOUNTER — Telehealth: Payer: Self-pay | Admitting: *Deleted

## 2020-08-20 ENCOUNTER — Inpatient Hospital Stay: Payer: Medicare Other | Attending: Family

## 2020-08-20 ENCOUNTER — Inpatient Hospital Stay (HOSPITAL_BASED_OUTPATIENT_CLINIC_OR_DEPARTMENT_OTHER): Payer: Medicare Other | Admitting: Family

## 2020-08-20 ENCOUNTER — Encounter: Payer: Self-pay | Admitting: Family

## 2020-08-20 VITALS — BP 150/54 | HR 90 | Temp 98.0°F | Resp 18 | Ht 61.02 in | Wt 124.1 lb

## 2020-08-20 DIAGNOSIS — N6320 Unspecified lump in the left breast, unspecified quadrant: Secondary | ICD-10-CM

## 2020-08-20 DIAGNOSIS — Z853 Personal history of malignant neoplasm of breast: Secondary | ICD-10-CM

## 2020-08-20 LAB — CBC WITH DIFFERENTIAL (CANCER CENTER ONLY)
Abs Immature Granulocytes: 0.03 10*3/uL (ref 0.00–0.07)
Basophils Absolute: 0.1 10*3/uL (ref 0.0–0.1)
Basophils Relative: 1 %
Eosinophils Absolute: 0.1 10*3/uL (ref 0.0–0.5)
Eosinophils Relative: 1 %
HCT: 47.9 % — ABNORMAL HIGH (ref 36.0–46.0)
Hemoglobin: 15.7 g/dL — ABNORMAL HIGH (ref 12.0–15.0)
Immature Granulocytes: 0 %
Lymphocytes Relative: 25 %
Lymphs Abs: 2.3 10*3/uL (ref 0.7–4.0)
MCH: 29.1 pg (ref 26.0–34.0)
MCHC: 32.8 g/dL (ref 30.0–36.0)
MCV: 88.7 fL (ref 80.0–100.0)
Monocytes Absolute: 0.9 10*3/uL (ref 0.1–1.0)
Monocytes Relative: 10 %
Neutro Abs: 5.8 10*3/uL (ref 1.7–7.7)
Neutrophils Relative %: 63 %
Platelet Count: 281 10*3/uL (ref 150–400)
RBC: 5.4 MIL/uL — ABNORMAL HIGH (ref 3.87–5.11)
RDW: 12.8 % (ref 11.5–15.5)
WBC Count: 9.2 10*3/uL (ref 4.0–10.5)
nRBC: 0 % (ref 0.0–0.2)

## 2020-08-20 LAB — CMP (CANCER CENTER ONLY)
ALT: 11 U/L (ref 0–44)
AST: 22 U/L (ref 15–41)
Albumin: 4.6 g/dL (ref 3.5–5.0)
Alkaline Phosphatase: 93 U/L (ref 38–126)
Anion gap: 11 (ref 5–15)
BUN: 15 mg/dL (ref 8–23)
CO2: 30 mmol/L (ref 22–32)
Calcium: 10.4 mg/dL — ABNORMAL HIGH (ref 8.9–10.3)
Chloride: 102 mmol/L (ref 98–111)
Creatinine: 0.93 mg/dL (ref 0.44–1.00)
GFR, Estimated: 57 mL/min — ABNORMAL LOW (ref >60)
Glucose, Bld: 135 mg/dL — ABNORMAL HIGH (ref 70–99)
Potassium: 4.9 mmol/L (ref 3.5–5.1)
Sodium: 143 mmol/L (ref 135–145)
Total Bilirubin: 0.6 mg/dL (ref 0.3–1.2)
Total Protein: 7.3 g/dL (ref 6.5–8.1)

## 2020-08-20 NOTE — Telephone Encounter (Signed)
Per 08/20/20 los Will follow-up once we have mammogram and [place] results.

## 2020-08-20 NOTE — Telephone Encounter (Signed)
S/w pty and she is aware of her appt at the breast center 09/10/20, she is also aware that they will call her with any cancellations.  Judson Roch is also aware      Nancy Bell

## 2020-08-20 NOTE — Progress Notes (Signed)
Hematology and Oncology Follow Up Visit  Nancy Bell [id number] 06-Jul-1924 87 y.o. 08/20/2020   Principle Diagnosis:  New left breast mass noted on exam 08/20/20 Stage I (T1b N0 M0) infiltrating ductal carcinoma of the right breast in 2011  Current Therapy:        Diagnostic mammogram and axillary [place]   Interim History:  Nancy Bell is here today to re-establish care. She has a new large left breast mass that she noted for the first time 3 days ago. She states that she does regular self breast exams and she has not noticed in the past.  She denies injury or recent infection.  She has a solid 5 in x 3 in palpable mass involving the nipple and base of the left breast. It is not painful. No redness, edema or open lesion/sores. No skin dimpling. She does have a healing bruise at the 6 o'clock position.  No adenopathy or lymphedema noted on exam.  No fever, chills, n/v, cough, rash, dizziness, SOB, chest pain, palpitations, abdominal pain or changes in bowel or bladder habits.  No swelling, tenderness, numbness or tingling in her extremities at this time.  No falls or syncope.  She has maintained a good appetite and is staying well hydrated. Her weight is 124 lbs.   She is quite independent and lives alone. He daughter lives right up the street.   ECOG Performance Status: 1 - Symptomatic but completely ambulatory  Medications:  Allergies as of 08/20/2020      Reactions   Augmentin [amoxicillin-pot Clavulanate] Other (See Comments)   Develops C Diff   Vicodin [hydrocodone-acetaminophen] Other (See Comments)   Did not sleep for 72 hrs      Medication List       Accurate as of August 20, 2020  1:03 PM. If you have any questions, ask your nurse or doctor.        STOP taking these medications   OVER THE COUNTER MEDICATION Stopped by: Laverna Peace, NP     TAKE these medications   ALPRAZolam 0.25 MG tablet Commonly known as: XANAX TAKE 1/2 A TABLET AS NEEDED FOR 30 DAYS    aspirin 81 MG tablet Take 81 mg by mouth every other day.   Biotin 1000 MCG tablet Take 1,000 mcg by mouth 3 (three) times daily.   CALCIUM PO Take 600 mg by mouth daily.   cholecalciferol 1000 units tablet Commonly known as: VITAMIN D Take 1,000 Units by mouth daily.   CO Q 10 PO Take by mouth.   diphenhydrAMINE 25 MG tablet Commonly known as: BENADRYL Take 25 mg by mouth at bedtime as needed.   FISH OIL PO Take by mouth.   hydrochlorothiazide 25 MG tablet Commonly known as: HYDRODIURIL Take 25 mg by mouth Daily.   lovastatin 40 MG tablet Commonly known as: MEVACOR   MULTIVITAMIN PO Take by mouth.   potassium chloride SA 20 MEQ tablet Commonly known as: KLOR-CON Take 1 tablet (20 mEq total) by mouth daily.       Allergies:  Allergies  Allergen Reactions  . Augmentin [Amoxicillin-Pot Clavulanate] Other (See Comments)    Develops C Diff  . Vicodin [Hydrocodone-Acetaminophen] Other (See Comments)    Did not sleep for 72 hrs    Past Medical History, Surgical history, Social history, and Family History were reviewed and updated.  Review of Systems: All other 10 point review of systems is negative.   Physical Exam:  height is 5' 1.02" (1.55 m) and  weight is 124 lb 1.9 oz (56.3 kg). Her oral temperature is 98 F (36.7 C). Her blood pressure is 150/54 (abnormal) and her pulse is 90. Her respiration is 18 and oxygen saturation is 100%.   Wt Readings from Last 3 Encounters:  08/20/20 124 lb 1.9 oz (56.3 kg)  02/15/18 132 lb (59.9 kg)  08/17/17 133 lb 8 oz (60.6 kg)    Ocular: Sclerae unicteric, pupils equal, round and reactive to light Ear-nose-throat: Oropharynx clear, dentition fair Lymphatic: No cervical, supraclavicular or axillary adenopathy Lungs no rales or rhonchi, good excursion bilaterally Heart regular rate and rhythm, no murmur appreciated Abd soft, nontender, positive bowel sounds MSK no focal spinal tenderness, no joint edema Neuro:  non-focal, well-oriented, appropriate affect Breasts: She has a solid 5 in x 3 in palpable mass involving the nipple and base of the left breast. It is not painful. No redness, edema, skin lesions or sores. No skin dimpling. She does have a healing bruise at the 6 o'clock position. Right breast exam negative.  No adenopathy or lymphedema noted on exam.   Lab Results  Component Value Date   WBC 9.2 08/20/2020   HGB 15.7 (H) 08/20/2020   HCT 47.9 (H) 08/20/2020   MCV 88.7 08/20/2020   PLT 281 08/20/2020   No results found for: FERRITIN, IRON, TIBC, UIBC, IRONPCTSAT Lab Results  Component Value Date   RBC 5.40 (H) 08/20/2020   No results found for: KPAFRELGTCHN, LAMBDASER, KAPLAMBRATIO No results found for: IGGSERUM, IGA, IGMSERUM No results found for: Odetta Pink, SPEI   Chemistry      Component Value Date/Time   NA 143 08/20/2020 1031   NA 146 (H) 02/15/2017 1008   NA 142 08/07/2016 0858   K 4.9 08/20/2020 1031   K 3.2 (L) 02/15/2017 1008   K 3.3 (L) 08/07/2016 0858   CL 102 08/20/2020 1031   CL 105 02/15/2017 1008   CO2 30 08/20/2020 1031   CO2 31 02/15/2017 1008   CO2 29 08/07/2016 0858   BUN 15 08/20/2020 1031   BUN 15 02/15/2017 1008   BUN 23.5 08/07/2016 0858   CREATININE 0.93 08/20/2020 1031   CREATININE 1.0 02/15/2017 1008   CREATININE 1.0 08/07/2016 0858      Component Value Date/Time   CALCIUM 10.4 (H) 08/20/2020 1031   CALCIUM 9.8 02/15/2017 1008   CALCIUM 10.0 08/07/2016 0858   ALKPHOS 93 08/20/2020 1031   ALKPHOS 80 02/15/2017 1008   ALKPHOS 84 08/07/2016 0858   AST 22 08/20/2020 1031   AST 24 08/07/2016 0858   ALT 11 08/20/2020 1031   ALT 20 02/15/2017 1008   ALT 14 08/07/2016 0858   BILITOT 0.6 08/20/2020 1031   BILITOT 0.50 08/07/2016 0858       Impression and Plan: Nancy Bell is a very pleasant 87 yo caucasian female with history of stage I ductal carcinoma of the right breast diagnosed in  September 2011. She discontinued her aromatase inhibitor due to intolerance. As mentioned above, she has a new mass in the left breast.  We have ordered a STAT mammogram and left axillary [place] to further evaluate. We will follow-up once results are available and get her referred over to surgery.  She was encouraged to contact our office with any questions or concerns.   Laverna Peace, NP 4/12/20221:03 PM

## 2020-08-30 ENCOUNTER — Ambulatory Visit
Admission: RE | Admit: 2020-08-30 | Discharge: 2020-08-30 | Disposition: A | Discharge status: Home / Self Care | Payer: Medicare Other | Source: Ambulatory Visit | Attending: Family | Admitting: Family

## 2020-08-30 ENCOUNTER — Other Ambulatory Visit: Payer: Self-pay | Admitting: Family

## 2020-08-30 ENCOUNTER — Other Ambulatory Visit: Payer: Self-pay

## 2020-08-30 DIAGNOSIS — R928 Other abnormal and inconclusive findings on diagnostic imaging of breast: Secondary | ICD-10-CM | POA: Diagnosis not present

## 2020-08-30 DIAGNOSIS — R922 Inconclusive mammogram: Secondary | ICD-10-CM | POA: Diagnosis not present

## 2020-08-30 DIAGNOSIS — N6324 Unspecified lump in the left breast, lower inner quadrant: Secondary | ICD-10-CM | POA: Diagnosis not present

## 2020-08-30 DIAGNOSIS — N6320 Unspecified lump in the left breast, unspecified quadrant: Secondary | ICD-10-CM

## 2020-08-30 DIAGNOSIS — N6323 Unspecified lump in the left breast, lower outer quadrant: Secondary | ICD-10-CM | POA: Diagnosis not present

## 2020-08-30 DIAGNOSIS — Z853 Personal history of malignant neoplasm of breast: Secondary | ICD-10-CM | POA: Diagnosis not present

## 2020-08-30 DIAGNOSIS — N6321 Unspecified lump in the left breast, upper outer quadrant: Secondary | ICD-10-CM | POA: Diagnosis not present

## 2020-09-03 ENCOUNTER — Ambulatory Visit
Admission: RE | Admit: 2020-09-03 | Discharge: 2020-09-03 | Disposition: A | Discharge status: Home / Self Care | Payer: Medicare Other | Source: Ambulatory Visit | Attending: Family | Admitting: Family

## 2020-09-03 ENCOUNTER — Other Ambulatory Visit: Payer: Self-pay

## 2020-09-03 DIAGNOSIS — N6321 Unspecified lump in the left breast, upper outer quadrant: Secondary | ICD-10-CM | POA: Diagnosis not present

## 2020-09-03 DIAGNOSIS — N6320 Unspecified lump in the left breast, unspecified quadrant: Secondary | ICD-10-CM

## 2020-09-03 DIAGNOSIS — C50812 Malignant neoplasm of overlapping sites of left female breast: Secondary | ICD-10-CM | POA: Diagnosis not present

## 2020-09-03 DIAGNOSIS — Z853 Personal history of malignant neoplasm of breast: Secondary | ICD-10-CM

## 2020-09-10 ENCOUNTER — Other Ambulatory Visit: Payer: Medicare Other

## 2020-09-12 ENCOUNTER — Encounter: Payer: Self-pay | Admitting: *Deleted

## 2020-09-12 DIAGNOSIS — C50112 Malignant neoplasm of central portion of left female breast: Secondary | ICD-10-CM | POA: Diagnosis not present

## 2020-09-12 DIAGNOSIS — Z170 Estrogen receptor positive status [ER+]: Secondary | ICD-10-CM | POA: Diagnosis not present

## 2020-09-12 NOTE — Progress Notes (Signed)
Established patient with new diagnosis of breast cancer. Patient sees Dr Marlou Starks today for discussion of surgery.   Reached out to [name] to introduce myself as the office RN Navigator and explain our new patient process.Reviewed with her the timeline. We will wait for surgical date and then schedule her appointment with Dr Marin Olp about 3 weeks after surgery. She understands I will reach out next week once surgery is scheduled with a follow up appointment time.    At this time patient has no further questions or needs.   Oncology Nurse Navigator Documentation  Oncology Nurse Navigator Flowsheets 09/12/2020  Abnormal Finding Date 08/30/2020  Confirmed Diagnosis Date 09/03/2020  Diagnosis Status Confirmed Diagnosis Complete  Phase of Treatment Surgery  Navigator Follow Up Date: 09/16/2020  Navigator Follow Up Reason: Appointment Review  Navigator Location [facility]  Navigator Encounter Type Introductory Phone Call  Patient Visit Type MedOnc  Treatment Phase Pre-Tx/Tx Discussion  Barriers/Navigation Needs Coordination of Care;Education  Education Other  Interventions Psycho-Social Support;Education  Acuity Level 2-Minimal Needs (1-2 Barriers Identified)  Education Method Verbal  Time Spent with Patient 30

## 2020-09-13 ENCOUNTER — Ambulatory Visit: Payer: Self-pay | Admitting: General Surgery

## 2020-09-16 ENCOUNTER — Encounter: Payer: Self-pay | Admitting: *Deleted

## 2020-09-16 NOTE — Progress Notes (Signed)
Patient surgery is scheduled for 09/30/2020. Schedule f/u appointment with Dr Marin Olp for approx 4 weeks post surgery.   Spoke to patient. She is nervous about upcoming surgery, but feels confident in surgeon and plan. Her daughters are supportive. She is aware of follow up with Dr Marin Olp including date and time.   Oncology Nurse Navigator Documentation  Oncology Nurse Navigator Flowsheets 09/16/2020  Abnormal Finding Date -  Confirmed Diagnosis Date -  Diagnosis Status -  Phase of Treatment -  Expected Surgery Date 09/30/2020  Navigator Follow Up Date: 09/30/2020  Navigator Follow Up Reason: Surgery  Navigator Location [facility]  Navigator Encounter Type Telephone;Appt/Treatment Plan Review  Telephone Outgoing Call  Patient Visit Type MedOnc  Treatment Phase Pre-Tx/Tx Discussion  Barriers/Navigation Needs Coordination of Care;Education  Education Other  Interventions Coordination of Care;Education;Psycho-Social Support  Acuity Level 2-Minimal Needs (1-2 Barriers Identified)  Coordination of Care Appts  Education Method Verbal  Support Groups/Services Friends and Family  Time Spent with Patient 30

## 2020-09-24 ENCOUNTER — Encounter (HOSPITAL_COMMUNITY): Payer: Self-pay

## 2020-09-24 ENCOUNTER — Encounter (HOSPITAL_COMMUNITY)
Admission: RE | Admit: 2020-09-24 | Discharge: 2020-09-24 | Disposition: A | Discharge status: Home / Self Care | Payer: Medicare Other | Source: Ambulatory Visit | Attending: General Surgery | Admitting: General Surgery

## 2020-09-24 ENCOUNTER — Other Ambulatory Visit: Payer: Self-pay

## 2020-09-24 DIAGNOSIS — I10 Essential (primary) hypertension: Secondary | ICD-10-CM | POA: Insufficient documentation

## 2020-09-24 DIAGNOSIS — Z01818 Encounter for other preprocedural examination: Secondary | ICD-10-CM | POA: Diagnosis not present

## 2020-09-24 HISTORY — DX: Anxiety disorder, unspecified: F41.9

## 2020-09-24 HISTORY — DX: Personal history of other diseases of the digestive system: Z87.19

## 2020-09-24 LAB — BASIC METABOLIC PANEL
Anion gap: 9 (ref 5–15)
BUN: 15 mg/dL (ref 8–23)
CO2: 31 mmol/L (ref 22–32)
Calcium: 9.5 mg/dL (ref 8.9–10.3)
Chloride: 101 mmol/L (ref 98–111)
Creatinine, Ser: 0.9 mg/dL (ref 0.44–1.00)
GFR, Estimated: 60 mL/min — ABNORMAL LOW (ref >60)
Glucose, Bld: 127 mg/dL — ABNORMAL HIGH (ref 70–99)
Potassium: 3.3 mmol/L — ABNORMAL LOW (ref 3.5–5.1)
Sodium: 141 mmol/L (ref 135–145)

## 2020-09-24 LAB — CBC
HCT: 46.8 % — ABNORMAL HIGH (ref 36.0–46.0)
Hemoglobin: 15.5 g/dL — ABNORMAL HIGH (ref 12.0–15.0)
MCH: 29.3 pg (ref 26.0–34.0)
MCHC: 33.1 g/dL (ref 30.0–36.0)
MCV: 88.5 fL (ref 80.0–100.0)
Platelets: 257 10*3/uL (ref 150–400)
RBC: 5.29 MIL/uL — ABNORMAL HIGH (ref 3.87–5.11)
RDW: 12.5 % (ref 11.5–15.5)
WBC: 9.5 10*3/uL (ref 4.0–10.5)
nRBC: 0 % (ref 0.0–0.2)

## 2020-09-24 NOTE — Pre-Procedure Instructions (Signed)
Surgical Instructions    Your procedure is scheduled on Monday, May 23rd.  Report to [facility] Main Entrance "A" at 9:45 A.M., then check in with the Admitting office.  Call this number if you have problems the morning of surgery:  [phone]   If you have any questions prior to your surgery date call [phone]: Open Monday-Friday 8am-4pm    Remember:  Do not eat after midnight the night before your surgery  You may drink clear liquids until 8:45 a.m. the morning of your surgery.   Clear liquids allowed are: Water, Non-Citrus Juices (without pulp), Carbonated Beverages, Clear Tea, Black Coffee Only, and Gatorade.    Take these medicines the morning of surgery with A SIP OF WATER  ALPRAZolam (XANAX)  amLODipine (NORVASC)  lovastatin (MEVACOR)  Follow your surgeon's instructions on when to stop Aspirin.  If no instructions were given by your surgeon then you will need to call the office to get those instructions.    As of today, STOP taking any Aleve, Naproxen, Ibuprofen, Motrin, Advil, Goody's, BC's, all herbal medications, fish oil, and all vitamins.                     Do NOT Smoke (Tobacco/Vaping) or drink Alcohol 24 hours prior to your procedure.  If you use a CPAP at night, you may bring all equipment for your overnight stay.   Contacts, glasses, piercing's, hearing aid's, dentures or partials may not be worn into surgery, please bring cases for these belongings.    For patients admitted to the hospital, discharge time will be determined by your treatment team.   Patients discharged the day of surgery will not be allowed to drive home, and someone needs to stay with them for 24 hours.    Special instructions:   [facility]- Preparing For Surgery  Before surgery, you can play an important role. Because skin is not sterile, your skin needs to be as free of germs as possible. You can reduce the number of germs on your skin by washing with CHG (chlorahexidine  gluconate) Soap before surgery.  CHG is an antiseptic cleaner which kills germs and bonds with the skin to continue killing germs even after washing.    Oral Hygiene is also important to reduce your risk of infection.  Remember - BRUSH YOUR TEETH THE MORNING OF SURGERY WITH YOUR REGULAR TOOTHPASTE  Please do not use if you have an allergy to CHG or antibacterial soaps. If your skin becomes reddened/irritated stop using the CHG.  Do not shave (including legs and underarms) for at least 48 hours prior to first CHG shower. It is OK to shave your face.  Please follow these instructions carefully.   1. Shower the NIGHT BEFORE SURGERY and the MORNING OF SURGERY  2. If you chose to wash your hair, wash your hair first as usual with your normal shampoo.  3. After you shampoo, rinse your hair and body thoroughly to remove the shampoo.  4. Use CHG Soap as you would any other liquid soap. You can apply CHG directly to the skin and wash gently with a scrungie or a clean washcloth.   5. Apply the CHG Soap to your body ONLY FROM THE NECK DOWN.  Do not use on open wounds or open sores. Avoid contact with your eyes, ears, mouth and genitals (private parts). Wash Face and genitals (private parts)  with your normal soap.   6. Wash thoroughly, paying special attention to the  area where your surgery will be performed.  7. Thoroughly rinse your body with warm water from the neck down.  8. DO NOT shower/wash with your normal soap after using and rinsing off the CHG Soap.  9. Pat yourself dry with a CLEAN TOWEL.  10. Wear CLEAN PAJAMAS to bed the night before surgery  11. Place CLEAN SHEETS on your bed the night before your surgery  12. DO NOT SLEEP WITH PETS.   Day of Surgery: Shower with CHG soap. Do not wear jewelry, make up, or nail polish Do not wear lotions, powders, perfumes, or deodorant. Do not shave 48 hours prior to surgery.   Do not bring valuables to the hospital. [facility] is not  responsible for any belongings or valuables. Wear Clean/Comfortable clothing the morning of surgery Remember to brush your teeth WITH YOUR REGULAR TOOTHPASTE.   Please read over the following fact sheets that you were given.

## 2020-09-24 NOTE — Progress Notes (Signed)
PCP - Dibas Dorthy Cooler, MD Cardiologist - Denies Oncologist- Burney Gauze, MD  PPM/ICD - Denies  Chest x-ray - N/A EKG - 09/24/20 Stress Test - Denies ECHO - Denies Cardiac Cath - Denies  Sleep Study - Denies  Patient denies being diabetic  Blood Thinner Instructions: N/A Aspirin Instructions: Per pt, last dose, 09/23/20  ERAS Protcol - Yes PRE-SURGERY Ensure or G2- None ordered  COVID TEST- 09/26/20   Anesthesia review: No  Patient denies shortness of breath, fever, cough and chest pain at PAT appointment   All instructions explained to the patient, with a verbal understanding of the material. Patient agrees to go over the instructions while at home for a better understanding. Patient also instructed to self quarantine after being tested for COVID-19. The opportunity to ask questions was provided.

## 2020-09-26 ENCOUNTER — Other Ambulatory Visit (HOSPITAL_COMMUNITY)
Admission: RE | Admit: 2020-09-26 | Discharge: 2020-09-26 | Disposition: A | Discharge status: Home / Self Care | Payer: Medicare Other | Source: Ambulatory Visit | Attending: General Surgery | Admitting: General Surgery

## 2020-09-26 DIAGNOSIS — Z01812 Encounter for preprocedural laboratory examination: Secondary | ICD-10-CM | POA: Insufficient documentation

## 2020-09-26 DIAGNOSIS — Z20822 Contact with and (suspected) exposure to covid-19: Secondary | ICD-10-CM | POA: Insufficient documentation

## 2020-09-26 LAB — SARS CORONAVIRUS 2 (TAT 6-24 HRS): SARS Coronavirus 2: NEGATIVE

## 2020-09-30 ENCOUNTER — Ambulatory Visit (HOSPITAL_COMMUNITY): Payer: Medicare Other | Admitting: Physician Assistant

## 2020-09-30 ENCOUNTER — Inpatient Hospital Stay (HOSPITAL_COMMUNITY)
Admission: RE | Admit: 2020-09-30 | Discharge: 2020-10-02 | DRG: 581 | Disposition: A | Discharge status: Home / Self Care | Payer: Medicare Other | Attending: General Surgery | Admitting: General Surgery

## 2020-09-30 ENCOUNTER — Encounter: Payer: Self-pay | Admitting: *Deleted

## 2020-09-30 ENCOUNTER — Encounter (HOSPITAL_COMMUNITY)
Admission: RE | Disposition: A | Discharge status: Home / Self Care | Payer: Self-pay | Source: Home / Self Care | Attending: General Surgery

## 2020-09-30 ENCOUNTER — Other Ambulatory Visit: Payer: Self-pay

## 2020-09-30 ENCOUNTER — Encounter (HOSPITAL_COMMUNITY): Payer: Self-pay | Admitting: General Surgery

## 2020-09-30 DIAGNOSIS — F419 Anxiety disorder, unspecified: Secondary | ICD-10-CM | POA: Diagnosis not present

## 2020-09-30 DIAGNOSIS — Z880 Allergy status to penicillin: Secondary | ICD-10-CM

## 2020-09-30 DIAGNOSIS — C50912 Malignant neoplasm of unspecified site of left female breast: Secondary | ICD-10-CM | POA: Diagnosis present

## 2020-09-30 DIAGNOSIS — Z800 Family history of malignant neoplasm of digestive organs: Secondary | ICD-10-CM

## 2020-09-30 DIAGNOSIS — I10 Essential (primary) hypertension: Secondary | ICD-10-CM | POA: Diagnosis not present

## 2020-09-30 DIAGNOSIS — C50112 Malignant neoplasm of central portion of left female breast: Secondary | ICD-10-CM | POA: Diagnosis not present

## 2020-09-30 DIAGNOSIS — Z888 Allergy status to other drugs, medicaments and biological substances status: Secondary | ICD-10-CM

## 2020-09-30 DIAGNOSIS — E785 Hyperlipidemia, unspecified: Secondary | ICD-10-CM | POA: Diagnosis not present

## 2020-09-30 DIAGNOSIS — Z170 Estrogen receptor positive status [ER+]: Secondary | ICD-10-CM | POA: Diagnosis not present

## 2020-09-30 HISTORY — PX: TOTAL MASTECTOMY: SHX6129

## 2020-09-30 SURGERY — MASTECTOMY, SIMPLE
Anesthesia: General | Site: Breast | Laterality: Left

## 2020-09-30 MED ORDER — PHENYLEPHRINE HCL (PRESSORS) 10 MG/ML IV SOLN
INTRAVENOUS | Status: AC
  Filled 2020-09-30: qty 1

## 2020-09-30 MED ORDER — DEXAMETHASONE SODIUM PHOSPHATE 10 MG/ML IJ SOLN
INTRAMUSCULAR | Status: DC | PRN
  Administered 2020-09-30: 5 mg via INTRAVENOUS

## 2020-09-30 MED ORDER — HYDROCHLOROTHIAZIDE 25 MG PO TABS
25.0000 mg | ORAL_TABLET | Freq: Every day | ORAL | Status: DC
  Administered 2020-10-01 – 2020-10-02 (×2): 25 mg via ORAL
  Filled 2020-09-30 (×3): qty 1

## 2020-09-30 MED ORDER — SUGAMMADEX SODIUM 200 MG/2ML IV SOLN
INTRAVENOUS | Status: DC | PRN
  Administered 2020-09-30: 100 mg via INTRAVENOUS

## 2020-09-30 MED ORDER — AMLODIPINE BESYLATE 2.5 MG PO TABS
2.5000 mg | ORAL_TABLET | Freq: Every day | ORAL | Status: DC
  Administered 2020-10-01 – 2020-10-02 (×2): 2.5 mg via ORAL
  Filled 2020-09-30 (×3): qty 1

## 2020-09-30 MED ORDER — SUCCINYLCHOLINE CHLORIDE 20 MG/ML IJ SOLN
INTRAMUSCULAR | Status: DC | PRN
  Administered 2020-09-30: 140 mg via INTRAVENOUS

## 2020-09-30 MED ORDER — ONDANSETRON HCL 4 MG/2ML IJ SOLN
INTRAMUSCULAR | Status: DC | PRN
  Administered 2020-09-30: 4 mg via INTRAVENOUS

## 2020-09-30 MED ORDER — PHENYLEPHRINE 40 MCG/ML (10ML) SYRINGE FOR IV PUSH (FOR BLOOD PRESSURE SUPPORT)
PREFILLED_SYRINGE | INTRAVENOUS | Status: AC
  Filled 2020-09-30: qty 20

## 2020-09-30 MED ORDER — ORAL CARE MOUTH RINSE: 15.0000 mL | Freq: Once | OROMUCOSAL | Status: AC

## 2020-09-30 MED ORDER — LIDOCAINE 2% (20 MG/ML) 5 ML SYRINGE
INTRAMUSCULAR | Status: DC | PRN
  Administered 2020-09-30: 40 mg via INTRAVENOUS

## 2020-09-30 MED ORDER — LACTATED RINGERS IV SOLN: INTRAVENOUS | Status: DC

## 2020-09-30 MED ORDER — PHENYLEPHRINE 40 MCG/ML (10ML) SYRINGE FOR IV PUSH (FOR BLOOD PRESSURE SUPPORT)
PREFILLED_SYRINGE | INTRAVENOUS | Status: DC | PRN
  Administered 2020-09-30: 80 ug via INTRAVENOUS
  Administered 2020-09-30: 40 ug via INTRAVENOUS
  Administered 2020-09-30: 80 ug via INTRAVENOUS
  Administered 2020-09-30: 200 ug via INTRAVENOUS

## 2020-09-30 MED ORDER — FENTANYL CITRATE (PF) 100 MCG/2ML IJ SOLN: 12.5000 ug | INTRAMUSCULAR | Status: DC | PRN

## 2020-09-30 MED ORDER — CHLORHEXIDINE GLUCONATE 0.12 % MT SOLN
15.0000 mL | Freq: Once | OROMUCOSAL | Status: AC
  Administered 2020-09-30: 15 mL via OROMUCOSAL
  Filled 2020-09-30: qty 15

## 2020-09-30 MED ORDER — FENTANYL CITRATE (PF) 100 MCG/2ML IJ SOLN
25.0000 ug | INTRAMUSCULAR | Status: DC | PRN
  Administered 2020-09-30: 50 ug via INTRAVENOUS
  Administered 2020-09-30 (×2): 25 ug via INTRAVENOUS

## 2020-09-30 MED ORDER — CHLORHEXIDINE GLUCONATE CLOTH 2 % EX PADS: 6.0000 | MEDICATED_PAD | Freq: Once | CUTANEOUS | Status: DC

## 2020-09-30 MED ORDER — GABAPENTIN 100 MG PO CAPS
100.0000 mg | ORAL_CAPSULE | ORAL | Status: AC
  Administered 2020-09-30: 100 mg via ORAL
  Filled 2020-09-30: qty 1

## 2020-09-30 MED ORDER — SODIUM CHLORIDE 0.9 % IV SOLN: INTRAVENOUS | Status: DC

## 2020-09-30 MED ORDER — FENTANYL CITRATE (PF) 100 MCG/2ML IJ SOLN
INTRAMUSCULAR | Status: AC
  Filled 2020-09-30: qty 2

## 2020-09-30 MED ORDER — TRAMADOL HCL 50 MG PO TABS
50.0000 mg | ORAL_TABLET | Freq: Four times a day (QID) | ORAL | Status: DC | PRN
  Administered 2020-09-30 – 2020-10-02 (×5): 50 mg via ORAL
  Filled 2020-09-30 (×6): qty 1

## 2020-09-30 MED ORDER — LIDOCAINE 2% (20 MG/ML) 5 ML SYRINGE
INTRAMUSCULAR | Status: AC
  Filled 2020-09-30: qty 15

## 2020-09-30 MED ORDER — ROCURONIUM BROMIDE 10 MG/ML (PF) SYRINGE
PREFILLED_SYRINGE | INTRAVENOUS | Status: AC
  Filled 2020-09-30: qty 20

## 2020-09-30 MED ORDER — ALPRAZOLAM 0.25 MG PO TABS
0.1250 mg | ORAL_TABLET | Freq: Every day | ORAL | Status: DC
  Administered 2020-10-01 – 2020-10-02 (×2): 0.125 mg via ORAL
  Filled 2020-09-30 (×2): qty 1

## 2020-09-30 MED ORDER — DEXAMETHASONE SODIUM PHOSPHATE 10 MG/ML IJ SOLN
INTRAMUSCULAR | Status: AC
  Filled 2020-09-30: qty 2

## 2020-09-30 MED ORDER — PHENYLEPHRINE HCL-NACL 10-0.9 MG/250ML-% IV SOLN
INTRAVENOUS | Status: DC | PRN
  Administered 2020-09-30: 30 ug/min via INTRAVENOUS

## 2020-09-30 MED ORDER — PROPOFOL 10 MG/ML IV BOLUS
INTRAVENOUS | Status: AC
  Filled 2020-09-30: qty 20

## 2020-09-30 MED ORDER — PANTOPRAZOLE SODIUM 40 MG IV SOLR
40.0000 mg | Freq: Every day | INTRAVENOUS | Status: DC
  Administered 2020-09-30 – 2020-10-01 (×2): 40 mg via INTRAVENOUS
  Filled 2020-09-30 (×2): qty 40

## 2020-09-30 MED ORDER — 0.9 % SODIUM CHLORIDE (POUR BTL) OPTIME
TOPICAL | Status: DC | PRN
  Administered 2020-09-30: 1000 mL

## 2020-09-30 MED ORDER — ONDANSETRON 4 MG PO TBDP: 4.0000 mg | ORAL_TABLET | Freq: Four times a day (QID) | ORAL | Status: DC | PRN

## 2020-09-30 MED ORDER — CELECOXIB 100 MG PO CAPS
100.0000 mg | ORAL_CAPSULE | ORAL | Status: AC
  Administered 2020-09-30: 100 mg via ORAL
  Filled 2020-09-30 (×2): qty 1

## 2020-09-30 MED ORDER — ROCURONIUM BROMIDE 10 MG/ML (PF) SYRINGE
PREFILLED_SYRINGE | INTRAVENOUS | Status: DC | PRN
  Administered 2020-09-30: 40 mg via INTRAVENOUS
  Administered 2020-09-30: 10 mg via INTRAVENOUS

## 2020-09-30 MED ORDER — FENTANYL CITRATE (PF) 250 MCG/5ML IJ SOLN
INTRAMUSCULAR | Status: DC | PRN
  Administered 2020-09-30 (×2): 50 ug via INTRAVENOUS

## 2020-09-30 MED ORDER — VANCOMYCIN HCL IN DEXTROSE 1-5 GM/200ML-% IV SOLN
1000.0000 mg | INTRAVENOUS | Status: AC
  Administered 2020-09-30: 1000 mg via INTRAVENOUS
  Filled 2020-09-30: qty 200

## 2020-09-30 MED ORDER — PROPOFOL 10 MG/ML IV BOLUS
INTRAVENOUS | Status: DC | PRN
  Administered 2020-09-30: 90 mg via INTRAVENOUS

## 2020-09-30 MED ORDER — FENTANYL CITRATE (PF) 250 MCG/5ML IJ SOLN
INTRAMUSCULAR | Status: AC
  Filled 2020-09-30: qty 5

## 2020-09-30 MED ORDER — METHOCARBAMOL 500 MG PO TABS
500.0000 mg | ORAL_TABLET | Freq: Four times a day (QID) | ORAL | Status: DC | PRN
  Administered 2020-10-01 – 2020-10-02 (×2): 500 mg via ORAL
  Filled 2020-09-30 (×2): qty 1

## 2020-09-30 MED ORDER — SUCCINYLCHOLINE CHLORIDE 200 MG/10ML IV SOSY
PREFILLED_SYRINGE | INTRAVENOUS | Status: AC
  Filled 2020-09-30: qty 10

## 2020-09-30 MED ORDER — HEPARIN SODIUM (PORCINE) 5000 UNIT/ML IJ SOLN
5000.0000 [IU] | Freq: Three times a day (TID) | INTRAMUSCULAR | Status: DC
  Administered 2020-09-30 – 2020-10-02 (×6): 5000 [IU] via SUBCUTANEOUS
  Filled 2020-09-30 (×6): qty 1

## 2020-09-30 MED ORDER — ACETAMINOPHEN 500 MG PO TABS: 1000.0000 mg | ORAL_TABLET | Freq: Once | ORAL | Status: DC

## 2020-09-30 MED ORDER — ONDANSETRON HCL 4 MG/2ML IJ SOLN: 4.0000 mg | Freq: Four times a day (QID) | INTRAMUSCULAR | Status: DC | PRN

## 2020-09-30 MED ORDER — ONDANSETRON HCL 4 MG/2ML IJ SOLN
INTRAMUSCULAR | Status: AC
  Filled 2020-09-30: qty 4

## 2020-09-30 SURGICAL SUPPLY — 48 items
APL PRP STRL LF DISP 70% ISPRP (MISCELLANEOUS) ×1
APPLIER CLIP 9.375 MED OPEN (MISCELLANEOUS) ×4
APR CLP MED 9.3 20 MLT OPN (MISCELLANEOUS) ×2
BINDER BREAST LRG (GAUZE/BANDAGES/DRESSINGS) IMPLANT
BINDER BREAST XLRG (GAUZE/BANDAGES/DRESSINGS) ×2 IMPLANT
BIOPATCH RED 1 DISK 7.0 (GAUZE/BANDAGES/DRESSINGS) ×2 IMPLANT
CANISTER SUCT 3000ML PPV (MISCELLANEOUS) ×2 IMPLANT
CHLORAPREP W/TINT 26 (MISCELLANEOUS) ×2 IMPLANT
CLIP APPLIE 9.375 MED OPEN (MISCELLANEOUS) ×2 IMPLANT
COVER SURGICAL LIGHT HANDLE (MISCELLANEOUS) ×2 IMPLANT
COVER WAND RF STERILE (DRAPES) ×2 IMPLANT
DERMABOND ADVANCED (GAUZE/BANDAGES/DRESSINGS) ×1
DERMABOND ADVANCED .7 DNX12 (GAUZE/BANDAGES/DRESSINGS) ×1 IMPLANT
DEVICE DSSCT PLSMBLD 3.0S LGHT (MISCELLANEOUS) IMPLANT
DRAIN CHANNEL 19F RND (DRAIN) ×4 IMPLANT
DRAPE LAPAROSCOPIC ABDOMINAL (DRAPES) ×2 IMPLANT
DRSG PAD ABDOMINAL 8X10 ST (GAUZE/BANDAGES/DRESSINGS) ×2 IMPLANT
DRSG TEGADERM 4X4.75 (GAUZE/BANDAGES/DRESSINGS) ×2 IMPLANT
ELECT COATED BLADE 2.86 ST (ELECTRODE) ×2 IMPLANT
ELECT REM PT RETURN 9FT ADLT (ELECTROSURGICAL) ×2
ELECTRODE REM PT RTRN 9FT ADLT (ELECTROSURGICAL) ×1 IMPLANT
EVACUATOR SILICONE 100CC (DRAIN) ×4 IMPLANT
FILTER IN LINE W/DETACHED HOSE (FILTER) ×2 IMPLANT
GAUZE SPONGE 4X4 12PLY STRL (GAUZE/BANDAGES/DRESSINGS) ×2 IMPLANT
GLOVE BIO SURGEON STRL SZ7.5 (GLOVE) ×2 IMPLANT
GOWN STRL REUS W/ TWL LRG LVL3 (GOWN DISPOSABLE) ×2 IMPLANT
GOWN STRL REUS W/TWL LRG LVL3 (GOWN DISPOSABLE) ×4
KIT BASIN OR (CUSTOM PROCEDURE TRAY) ×2 IMPLANT
KIT TURNOVER KIT B (KITS) ×2 IMPLANT
LIGHT WAVEGUIDE WIDE FLAT (MISCELLANEOUS) IMPLANT
NS IRRIG 1000ML POUR BTL (IV SOLUTION) ×2 IMPLANT
PACK GENERAL/GYN (CUSTOM PROCEDURE TRAY) ×2 IMPLANT
PAD ARMBOARD 7.5X6 YLW CONV (MISCELLANEOUS) ×2 IMPLANT
PENCIL SMOKE EVACUATOR (MISCELLANEOUS) ×2 IMPLANT
PLASMABLADE 3.0S W/LIGHT (MISCELLANEOUS)
SPECIMEN JAR X LARGE (MISCELLANEOUS) ×2 IMPLANT
SUT ETHILON 2 0 FS 18 (SUTURE) ×2 IMPLANT
SUT ETHILON 3 0 FSL (SUTURE) ×2 IMPLANT
SUT MNCRL AB 4-0 PS2 18 (SUTURE) ×2 IMPLANT
SUT MON AB 4-0 PC3 18 (SUTURE) ×2 IMPLANT
SUT VIC AB 0 CT1 27 (SUTURE) ×2
SUT VIC AB 0 CT1 27XBRD ANBCTR (SUTURE) ×1 IMPLANT
SUT VIC AB 3-0 54X BRD REEL (SUTURE) IMPLANT
SUT VIC AB 3-0 BRD 54 (SUTURE)
SUT VIC AB 3-0 SH 18 (SUTURE) ×4 IMPLANT
TOWEL GREEN STERILE (TOWEL DISPOSABLE) ×2 IMPLANT
TOWEL GREEN STERILE FF (TOWEL DISPOSABLE) ×2 IMPLANT
TUBE CONNECTING 12X1/4 (SUCTIONS) IMPLANT

## 2020-09-30 NOTE — H&P (Signed)
Nancy Bell  Location: [facility] Patient #: [id number] DOB: May 16, 1924 Widowed / Language: Nancy Bell / Race: White Female   History of Present Illness  The patient is a 87 year old female who presents with breast cancer. We are asked to see the patient in consultation by Dr. Laverna Peace to evaluate her for a new left breast cancer. The patient is a 87 year old white female who recently felt a mass in the central portion of the left breast. This was biopsied and came back as an invasive breast cancer. She does have a history of right breast cancer about 10 years ago that was treated with lumpectomy and radiation. At that time she did not tolerate anti-estrogens. She is otherwise in good health other than some hypertension and does not smoke. Her cancer is ER and PR positive and HER-2 negative with a Ki-67 of 5%   Past Surgical History  Breast Biopsy  Bilateral, Left. Breast Mass; Local Excision  Right.  Diagnostic Studies History  Colonoscopy  5-10 years ago Mammogram  within last year  Allergies  Augmentin *PENICILLINS*  Benadryl *ANTIHISTAMINES*  Allergies Reconciled   Medication History  ALPRAZolam (0.25MG Tablet, Oral) Active. HydroCHLOROthiazide (25MG Tablet, Oral) Active. Lovastatin (40MG Tablet, Oral) Active. Aspirin (81MG Tablet, Oral) Active. Fish Oil (Oral) Specific strength unknown - Active. Medications Reconciled  Social History  Caffeine use  Coffee, Tea. No alcohol use  No drug use  Tobacco use  Never smoker.  Family History Colon Cancer  Father.  Pregnancy / Birth History  Age at menarche  74 years, 1 years. Age of menopause  <47 Gravida  3 Maternal age  39-20 Para  3  Other Problems  Anxiety Disorder  Back Pain  Breast Cancer  High blood pressure  Lump In Breast  Other disease, cancer, significant illness     Review of Systems  General Not Present- Appetite Loss, Chills, Fatigue, Fever,  Night Sweats, Weight Gain and Weight Loss. Skin Present- Non-Healing Wounds. Not Present- Change in Wart/Mole, Dryness, Hives, Jaundice, New Lesions, Rash and Ulcer. HEENT Present- Seasonal Allergies. Not Present- Earache, Hearing Loss, Hoarseness, Nose Bleed, Oral Ulcers, Ringing in the Ears, Sinus Pain, Sore Throat, Visual Disturbances, Wears glasses/contact lenses and Yellow Eyes. Respiratory Not Present- Bloody sputum, Chronic Cough, Difficulty Breathing, Snoring and Wheezing. Breast Present- Breast Mass and Skin Changes. Not Present- Breast Pain and Nipple Discharge. Cardiovascular Not Present- Chest Pain, Difficulty Breathing Lying Down, Leg Cramps, Palpitations, Rapid Heart Rate, Shortness of Breath and Swelling of Extremities. Gastrointestinal Not Present- Abdominal Pain, Bloating, Bloody Stool, Change in Bowel Habits, Chronic diarrhea, Constipation, Difficulty Swallowing, Excessive gas, Gets full quickly at meals, Hemorrhoids, Indigestion, Nausea, Rectal Pain and Vomiting. Female Genitourinary Not Present- Frequency, Nocturia, Painful Urination, Pelvic Pain and Urgency. Musculoskeletal Not Present- Back Pain, Joint Pain, Joint Stiffness, Muscle Pain, Muscle Weakness and Swelling of Extremities. Neurological Not Present- Decreased Memory, Fainting, Headaches, Numbness, Seizures, Tingling, Tremor, Trouble walking and Weakness. Endocrine Not Present- Cold Intolerance, Excessive Hunger, Hair Changes, Heat Intolerance, Hot flashes and New Diabetes. Hematology Not Present- Blood Thinners, Easy Bruising, Excessive bleeding, Gland problems, HIV and Persistent Infections.  Vitals  Weight: 124.25 lb Height: 61in Body Surface Area: 1.54 m Body Mass Index: 23.48 kg/m  Temp.: 96.64F  Pulse: 104 (Regular)  P.OX: 98% (Room air) BP: 160/80(Sitting, Left Arm, Standard)       Physical Exam  General Mental Status-Alert. General Appearance-Consistent with stated  age. Hydration-Well hydrated. Voice-Normal.  Head and Neck Head-normocephalic, atraumatic  with no lesions or palpable masses. Trachea-midline. Thyroid Gland Characteristics - normal size and consistency.  Eye Eyeball - Bilateral-Extraocular movements intact. Sclera/Conjunctiva - Bilateral-No scleral icterus.  Chest and Lung Exam Chest and lung exam reveals -quiet, even and easy respiratory effort with no use of accessory muscles and on auscultation, normal breath sounds, no adventitious sounds and normal vocal resonance. Inspection Chest Wall - Normal. Back - normal.  Breast Note: There is a 6 cm mass centrally in the left breast behind the nipple and areola. It does not appear to be tethered to the chest wall. There is no skin breakdown. There is no palpable mass in the right breast. There is no palpable axillary, supraclavicular, or cervical lymphadenopathy.   Cardiovascular Cardiovascular examination reveals -normal heart sounds, regular rate and rhythm with no murmurs and normal pedal pulses bilaterally.  Abdomen Inspection Inspection of the abdomen reveals - No Hernias. Skin - Scar - no surgical scars. Palpation/Percussion Palpation and Percussion of the abdomen reveal - Soft, Non Tender, No Rebound tenderness, No Rigidity (guarding) and No hepatosplenomegaly. Auscultation Auscultation of the abdomen reveals - Bowel sounds normal.  Neurologic Neurologic evaluation reveals -alert and oriented x 3 with no impairment of recent or remote memory. Mental Status-Normal.  Musculoskeletal Normal Exam - Left-Upper Extremity Strength Normal and Lower Extremity Strength Normal. Normal Exam - Right-Upper Extremity Strength Normal and Lower Extremity Strength Normal.  Lymphatic Head & Neck  General Head & Neck Lymphatics: Bilateral - Description - Normal. Axillary  General Axillary Region: Bilateral - Description - Normal. Tenderness - Non  Tender. Femoral & Inguinal  Generalized Femoral & Inguinal Lymphatics: Bilateral - Description - Normal. Tenderness - Non Tender.    Assessment & Plan  MALIGNANT NEOPLASM OF CENTRAL PORTION OF LEFT BREAST IN FEMALE, ESTROGEN RECEPTOR POSITIVE (C50.112) Impression: The patient has a 6.5 cm cancer in the central portion of the left breast with clinically negative nodes. I have discussed with her in detail the different options for management and at this point I would recommend mastectomy as her best option. I have discussed with her in detail the risks and benefits of the operation as well as the technical aspects and she understands and wishes to proceed. I will refer her to medical and radiation oncology to discuss adjuvant therapy. She has seen Dr. Lutricia Feil in the past for a right breast cancer. This patient encounter took 60 minutes today to perform the following: take history, perform exam, review outside records, interpret imaging, counsel the patient on their diagnosis and document encounter, findings & plan in the EHR Current Plans Referred to Oncology, for evaluation and follow up (Oncology). Routine.

## 2020-09-30 NOTE — Progress Notes (Signed)
Oncology Nurse Navigator Documentation  Oncology Nurse Navigator Flowsheets 09/30/2020  Abnormal Finding Date -  Confirmed Diagnosis Date -  Diagnosis Status -  Phase of Treatment Surgery  Expected Surgery Date -  Surgery Actual Start Date: 09/30/2020  Navigator Follow Up Date: 10/04/2020  Navigator Follow Up Reason: Pathology  Navigator Location [facility]  Navigator Encounter Type Appt/Treatment Plan Review  Telephone -  Treatment Initiated Date 09/30/2020  Patient Visit Type MedOnc  Treatment Phase Active Tx  Barriers/Navigation Needs Coordination of Care;Education  Education -  Interventions None Required  Acuity Level 2-Minimal Needs (1-2 Barriers Identified)  Coordination of Care -  Education Method -  Support Groups/Services Friends and Family  Time Spent with Patient 15

## 2020-09-30 NOTE — Anesthesia Preprocedure Evaluation (Addendum)
Anesthesia Evaluation  Patient identified by MRN, date of birth, ID band Patient awake    Reviewed: Allergy & Precautions, NPO status , Patient's Chart, lab work & pertinent test results  Airway Mallampati: I  TM Distance: >3 FB Neck ROM: Full    Dental no notable dental hx. (+) Teeth Intact   Pulmonary neg pulmonary ROS,    Pulmonary exam normal breath sounds clear to auscultation       Cardiovascular hypertension, Pt. on medications Normal cardiovascular exam Rhythm:Regular Rate:Normal     Neuro/Psych PSYCHIATRIC DISORDERS Anxiety negative neurological ROS     GI/Hepatic Neg liver ROS, hiatal hernia,   Endo/Other  negative endocrine ROS  Renal/GU negative Renal ROS  negative genitourinary   Musculoskeletal negative musculoskeletal ROS (+)   Abdominal   Peds  Hematology negative hematology ROS (+)   Anesthesia Other Findings Left breast CA  Reproductive/Obstetrics                            Anesthesia Physical Anesthesia Plan  ASA: II  Anesthesia Plan: General   Post-op Pain Management:    Induction: Intravenous  PONV Risk Score and Plan: 3 and Ondansetron, Dexamethasone and Treatment may vary due to age or medical condition  Airway Management Planned: Oral ETT  Additional Equipment:   Intra-op Plan:   Post-operative Plan: Extubation in OR  Informed Consent: I have reviewed the patients History and Physical, chart, labs and discussed the procedure including the risks, benefits and alternatives for the proposed anesthesia with the patient or authorized representative who has indicated his/her understanding and acceptance.     Dental advisory given  Plan Discussed with: CRNA  Anesthesia Plan Comments:        Anesthesia Quick Evaluation

## 2020-09-30 NOTE — Interval H&P Note (Signed)
History and Physical Interval Note:  09/30/2020 11:50 AM  Nancy Bell  has presented today for surgery, with the diagnosis of LEFT BREAST CANCER.  The various methods of treatment have been discussed with the patient and family. After consideration of risks, benefits and other options for treatment, the patient has consented to  Procedure(s): LEFT TOTAL MASTECTOMY (Left) as a surgical intervention.  The patient's history has been reviewed, patient examined, no change in status, stable for surgery.  I have reviewed the patient's chart and labs.  Questions were answered to the patient's satisfaction.     Autumn Messing III

## 2020-09-30 NOTE — Op Note (Signed)
09/30/2020  1:25 PM  PATIENT:  Nancy Bell  87 y.o. female  PRE-OPERATIVE DIAGNOSIS:  LEFT BREAST CANCER  POST-OPERATIVE DIAGNOSIS:  LEFT BREAST CANCER  PROCEDURE:  Procedure(s): LEFT TOTAL MASTECTOMY (Left)  SURGEON:  Surgeon(s) and Role:    * Jovita Kussmaul, MD - Primary  PHYSICIAN ASSISTANT:   ASSISTANTS: none   ANESTHESIA:   general  EBL:  20 mL   BLOOD ADMINISTERED:none  DRAINS: (1) Jackson-Pratt drain(s) with closed bulb suction in the prepectoral space   LOCAL MEDICATIONS USED:  NONE  SPECIMEN:  Source of Specimen:  left mastectomy  DISPOSITION OF SPECIMEN:  PATHOLOGY  COUNTS:  YES  TOURNIQUET:  * No tourniquets in log *  DICTATION: .Dragon Dictation   After informed consent was obtained the patient was brought to the operating room and placed in the supine position on the operating table.  After adequate induction of general anesthesia the patient's left chest, breast, and axillary area were prepped with ChloraPrep, allowed to dry, and draped in usual sterile manner.  An appropriate timeout was performed.  The patient had a large palpable cancer involving the central portion of the left breast.  An elliptical incision was then made around the nipple and areolar complex in order to be around the palpable mass and minimize the excess skin.  The incision was carried through the skin and subcutaneous tissue sharply with the PlasmaBlade.  Breast hooks were used to elevate the skin flaps anteriorly towards the ceiling.  Thin skin flaps were then created circumferentially by dissection between the breast tissue and the subcutaneous fat and skin.  This dissection was carried all the way to the chest wall circumferentially.  Next the breast was removed from the pectoralis muscle with the pectoralis fascia.  Once this was accomplished the entire left breast was removed.  There were a couple of low-lying nodes that were removed with the tail of the breast.  The breast was  marked with a stitch on the lateral skin and sent to pathology for further evaluation.  Several small vessels on the lateral chest wall were controlled with clips.  Hemostasis was then achieved using the PlasmaBlade.  The wound was irrigated with saline.  The operative bed was examined and found to be hemostatic.  A small stab incision was made near the anterior axillary line inferior to the operative bed with a 15 blade knife.  A tonsil clamp was placed through this opening and used to bring a 19 [place] round Blake drain into the operative bed.  The drain was curled along the chest wall.  The drain was anchored to the skin with a 3-0 nylon stitch.  Next the lateral axillary tissue was tacked to the chest wall with some interrupted 0 Vicryl stitches.  The superior and inferior skin flaps were then grossly reapproximated with interrupted 3-0 Vicryl stitches.  The skin was closed with running 4-0 Monocryl subcuticular stitches.  Dermabond dressings were applied along with a sterile drain dressing.  The patient tolerated the procedure well.  At the end of the case all needle sponge and instrument counts were correct.  The patient was then awakened and taken recovery in stable condition.  PLAN OF CARE: Admit for overnight observation  PATIENT DISPOSITION:  PACU - hemodynamically stable.   Delay start of Pharmacological VTE agent (>24hrs) due to surgical blood loss or risk of bleeding: no

## 2020-09-30 NOTE — Transfer of Care (Signed)
Immediate Anesthesia Transfer of Care Note  Patient: Nancy Bell  Procedure(s) Performed: LEFT TOTAL MASTECTOMY (Left Breast)  Patient Location: PACU  Anesthesia Type:General  Level of Consciousness: awake, alert  and oriented  Airway & Oxygen Therapy: Patient Spontanous Breathing and Patient connected to face mask oxygen  Post-op Assessment: Report given to RN and Post -op Vital signs reviewed and stable  Post vital signs: Reviewed and stable  Last Vitals:  Vitals Value Taken Time  BP 105/51 09/30/20 1345  Temp 36.4 C 09/30/20 1345  Pulse 74 09/30/20 1346  Resp 14 09/30/20 1348  SpO2 98 % 09/30/20 1346  Vitals shown include unvalidated device data.  Last Pain:  Vitals:   09/30/20 1012  TempSrc:   PainSc: 0-No pain      Patients Stated Pain Goal: 4 (78/67/54 [id number])  Complications: No complications documented.

## 2020-09-30 NOTE — Anesthesia Procedure Notes (Signed)
Procedure Name: Intubation Date/Time: 09/30/2020 12:12 PM Performed by: Janace Litten, CRNA Pre-anesthesia Checklist: Patient identified, Emergency Drugs available, Suction available and Patient being monitored Patient Re-evaluated:Patient Re-evaluated prior to induction Oxygen Delivery Method: Circle System Utilized Preoxygenation: Pre-oxygenation with 100% oxygen Induction Type: IV induction and Rapid sequence Ventilation: Mask ventilation without difficulty Laryngoscope Size: Mac and 3 Grade View: Grade I Tube type: Oral Tube size: 7.0 mm Number of attempts: 1 Airway Equipment and Method: Stylet Placement Confirmation: ETT inserted through vocal cords under direct vision,  positive ETCO2 and breath sounds checked- equal and bilateral Secured at: 20 cm Tube secured with: Tape Dental Injury: Teeth and Oropharynx as per pre-operative assessment  Comments: Intubation by Cathlean Cower, Paramedic Student

## 2020-10-01 ENCOUNTER — Encounter (HOSPITAL_COMMUNITY): Payer: Self-pay | Admitting: General Surgery

## 2020-10-01 MED ORDER — TRAMADOL HCL 50 MG PO TABS: 50.0000 mg | ORAL_TABLET | Freq: Four times a day (QID) | ORAL | 1 refills | Status: DC | PRN

## 2020-10-01 MED ORDER — METHOCARBAMOL 500 MG PO TABS: 500.0000 mg | ORAL_TABLET | Freq: Four times a day (QID) | ORAL | 1 refills | Status: DC | PRN

## 2020-10-01 MED ORDER — GABAPENTIN 100 MG PO CAPS: 100.0000 mg | ORAL_CAPSULE | Freq: Three times a day (TID) | ORAL | 2 refills | Status: DC | PRN

## 2020-10-01 NOTE — Progress Notes (Signed)
Reviewed Breast Cancer Bag and JP drain education-how to strip drain and empty and record the drainage. Reviewed left arm precautions. 2 daughters at bedside and one of them will be assisting with the drain. Informed them to take the record of the amount of drainage back to the follow up appointment with Dr. Marlou Starks.

## 2020-10-01 NOTE — Plan of Care (Signed)
  Problem: Education: Goal: Knowledge of General Education information will improve Description: Including pain rating scale, medication(s)/side effects and non-pharmacologic comfort measures Outcome: Progressing   Problem: Health Behavior/Discharge Planning: Goal: Ability to manage health-related needs will improve Outcome: Progressing   Problem: Clinical Measurements: Goal: Ability to maintain clinical measurements within normal limits will improve Outcome: Progressing Goal: Will remain free from infection Outcome: Progressing Goal: Diagnostic test results will improve Outcome: Progressing Goal: Respiratory complications will improve Outcome: Progressing Goal: Cardiovascular complication will be avoided Outcome: Progressing   Problem: Activity: Goal: Risk for activity intolerance will decrease Outcome: Progressing   Problem: Nutrition: Goal: Adequate nutrition will be maintained Outcome: Progressing   Problem: Coping: Goal: Level of anxiety will decrease Outcome: Progressing   Problem: Elimination: Goal: Will not experience complications related to bowel motility Outcome: Progressing Goal: Will not experience complications related to urinary retention Outcome: Progressing   Problem: Pain Managment: Goal: General experience of comfort will improve Outcome: Progressing   Problem: Safety: Goal: Ability to remain free from injury will improve Outcome: Progressing   Problem: Skin Integrity: Goal: Risk for impaired skin integrity will decrease Outcome: Progressing   Problem: Education: Goal: Knowledge of disease or condition will improve Outcome: Progressing   Problem: Activity: Goal: Ability to maintain or regain function will improve Outcome: Progressing   Problem: Clinical Measurements: Goal: Postoperative complications will be avoided or minimized Outcome: Progressing   Problem: Self-Concept: Goal: Ability to verbalize positive feelings about self will  improve Outcome: Progressing   Problem: Pain Management: Goal: Expressions of feelings of enhanced comfort will increase Outcome: Progressing   Problem: Skin Integrity: Goal: Demonstration of wound healing without infection will improve Outcome: Progressing

## 2020-10-01 NOTE — Progress Notes (Signed)
1 Day Post-Op   Subjective/Chief Complaint: Complains of pain at operative site   Objective: Vital signs in last 24 hours: Temp:  [97.6 F (36.4 C)-98.7 F (37.1 C)] 98.7 F (37.1 C) (05/24 0544) Pulse Rate:  [57-85] 73 (05/24 0544) Resp:  [8-19] 16 (05/24 0544) BP: (99-121)/(48-65) 121/56 (05/24 0928) SpO2:  [94 %-100 %] 98 % (05/24 0544) Last BM Date: 09/30/20  Intake/Output from previous day: 05/23 0701 - 05/24 0700 In: 1240 [P.O.:120; I.V.:1120] Out: 96 [Drains:76; Blood:20] Intake/Output this shift: Total I/O In: -  Out: 20 [Drains:20]  General appearance: alert and cooperative Resp: clear to auscultation bilaterally Chest wall: skin flaps look good Cardio: regular rate and rhythm GI: soft, non-tender; bowel sounds normal; no masses,  no organomegaly  Lab Results:  No results for input(s): WBC, HGB, HCT, PLT in the last 72 hours. BMET No results for input(s): NA, K, CL, CO2, GLUCOSE, BUN, CREATININE, CALCIUM in the last 72 hours. PT/INR No results for input(s): LABPROT, INR in the last 72 hours. ABG No results for input(s): PHART, HCO3 in the last 72 hours.  Invalid input(s): PCO2, PO2  Studies/Results: No results found.  Anti-infectives: Anti-infectives (From admission, onward)   Start     Dose/Rate Route Frequency Ordered Stop   09/30/20 0945  vancomycin (VANCOCIN) IVPB 1000 mg/200 mL premix        1,000 mg 200 mL/hr over 60 Minutes Intravenous On call to O.R. 09/30/20 0943 09/30/20 1154      Assessment/Plan: s/p Procedure(s): LEFT TOTAL MASTECTOMY (Left) Advance diet Discharge  Teach pt drain care  LOS: 0 days    Nancy Bell 10/01/2020

## 2020-10-01 NOTE — Anesthesia Postprocedure Evaluation (Signed)
Anesthesia Post Note  Patient: Nancy Bell  Procedure(s) Performed: LEFT TOTAL MASTECTOMY (Left Breast)     Patient location during evaluation: PACU Anesthesia Type: General Level of consciousness: awake and alert Pain management: pain level controlled Vital Signs Assessment: post-procedure vital signs reviewed and stable Respiratory status: spontaneous breathing, nonlabored ventilation, respiratory function stable and patient connected to nasal cannula oxygen Cardiovascular status: blood pressure returned to baseline and stable Postop Assessment: no apparent nausea or vomiting Anesthetic complications: no   No complications documented.  Last Vitals:  Vitals:   10/01/20 0206 10/01/20 0544  BP: (!) 106/51 (!) 116/55  Pulse: 65 73  Resp: 17 16  Temp: 36.7 C 37.1 C  SpO2: 94% 98%    Last Pain:  Vitals:   10/01/20 0544  TempSrc: Oral  PainSc:                  Melody Savidge L Lujain Kraszewski

## 2020-10-02 DIAGNOSIS — I10 Essential (primary) hypertension: Secondary | ICD-10-CM | POA: Diagnosis present

## 2020-10-02 DIAGNOSIS — Z170 Estrogen receptor positive status [ER+]: Secondary | ICD-10-CM | POA: Diagnosis not present

## 2020-10-02 DIAGNOSIS — Z880 Allergy status to penicillin: Secondary | ICD-10-CM | POA: Diagnosis not present

## 2020-10-02 DIAGNOSIS — C50112 Malignant neoplasm of central portion of left female breast: Secondary | ICD-10-CM | POA: Diagnosis present

## 2020-10-02 DIAGNOSIS — Z888 Allergy status to other drugs, medicaments and biological substances status: Secondary | ICD-10-CM | POA: Diagnosis not present

## 2020-10-02 DIAGNOSIS — Z800 Family history of malignant neoplasm of digestive organs: Secondary | ICD-10-CM | POA: Diagnosis not present

## 2020-10-02 MED ORDER — PANTOPRAZOLE SODIUM 40 MG PO TBEC: 40.0000 mg | DELAYED_RELEASE_TABLET | Freq: Every day | ORAL | Status: DC

## 2020-10-02 NOTE — Progress Notes (Signed)
2 Days Post-Op   Subjective/Chief Complaint: Complains of soreness along chest wall   Objective: Vital signs in last 24 hours: Temp:  [98 F (36.7 C)-98.7 F (37.1 C)] 98.3 F (36.8 C) (05/25 0417) Pulse Rate:  [63-72] 63 (05/25 0417) Resp:  [16-20] 17 (05/25 0417) BP: (109-119)/(54-57) 119/57 (05/25 0417) SpO2:  [93 %-95 %] 95 % (05/25 0417) Last BM Date: 09/30/20  Intake/Output from previous day: 05/24 0701 - 05/25 0700 In: 184.5 [P.O.:60; I.V.:124.5] Out: 96 [Drains:96] Intake/Output this shift: No intake/output data recorded.  General appearance: alert and cooperative Resp: clear to auscultation bilaterally Chest wall: skin flaps look good Cardio: regular rate and rhythm GI: soft, non-tender; bowel sounds normal; no masses,  no organomegaly  Lab Results:  No results for input(s): WBC, HGB, HCT, PLT in the last 72 hours. BMET No results for input(s): NA, K, CL, CO2, GLUCOSE, BUN, CREATININE, CALCIUM in the last 72 hours. PT/INR No results for input(s): LABPROT, INR in the last 72 hours. ABG No results for input(s): PHART, HCO3 in the last 72 hours.  Invalid input(s): PCO2, PO2  Studies/Results: No results found.  Anti-infectives: Anti-infectives (From admission, onward)   Start     Dose/Rate Route Frequency Ordered Stop   09/30/20 0945  vancomycin (VANCOCIN) IVPB 1000 mg/200 mL premix        1,000 mg 200 mL/hr over 60 Minutes Intravenous On call to O.R. 09/30/20 0943 09/30/20 1154      Assessment/Plan: s/p Procedure(s): LEFT TOTAL MASTECTOMY (Left) Advance diet Discharge  LOS: 0 days    Nancy Bell 10/02/2020

## 2020-10-02 NOTE — Discharge Summary (Signed)
Physician Discharge Summary  Patient ID: Nancy Bell MRN: [id number] DOB/AGE: 85-Jan-1926 85 y.o.  Admit date: 09/30/2020 Discharge date: 10/02/2020  Admission Diagnoses:  Discharge Diagnoses:  Active Problems:   Cancer of left female breast [facility])   Discharged Condition: good  Hospital Course: the pt underwent left mastectomy. She tolerated surgery well. On pod 2 she was ready for d/c home  Consults: None  Significant Diagnostic Studies: none  Treatments: surgery: as above  Discharge Exam: Blood pressure (!) 119/57, pulse 63, temperature 98.3 F (36.8 C), temperature source Oral, resp. rate 17, height 5\' 1"  (1.549 m), weight 56.2 kg, SpO2 95 %. Chest wall: skin flaps look good  Disposition: Discharge disposition: 01-Home or Self Care       Discharge Instructions    Call MD for:  difficulty breathing, headache or visual disturbances   Complete by: As directed    Call MD for:  extreme fatigue   Complete by: As directed    Call MD for:  hives   Complete by: As directed    Call MD for:  persistant dizziness or light-headedness   Complete by: As directed    Call MD for:  persistant nausea and vomiting   Complete by: As directed    Call MD for:  redness, tenderness, or signs of infection (pain, swelling, redness, odor or green/yellow discharge around incision site)   Complete by: As directed    Call MD for:  severe uncontrolled pain   Complete by: As directed    Call MD for:  temperature >100.4   Complete by: As directed    Diet - low sodium heart healthy   Complete by: As directed    Discharge instructions   Complete by: As directed    May shower after 2-3 days. No overhead acitivity with left arm. Diet as tolerated. Empty drain, record output, recharge bulb daily. Wear binder most of the time   Increase activity slowly   Complete by: As directed    No wound care   Complete by: As directed      Allergies as of 10/02/2020      Reactions   Augmentin  [amoxicillin-pot Clavulanate] Other (See Comments)   Develops C Diff   Vicodin [hydrocodone-acetaminophen] Other (See Comments)   Did not sleep for 72 hrs   Tylenol [acetaminophen] Nausea And Vomiting   chills      Medication List    TAKE these medications   ALPRAZolam 0.25 MG tablet Commonly known as: XANAX Take 0.125 mg by mouth daily.   amLODipine 2.5 MG tablet Commonly known as: NORVASC Take 2.5 mg by mouth daily.   aspirin 81 MG tablet Take 81 mg by mouth daily.   CALCIUM PO Take 600 mg by mouth daily.   cholecalciferol 1000 units tablet Commonly known as: VITAMIN D Take 1,000 Units by mouth daily.   CO Q 10 PO Take 1 capsule by mouth daily.   FISH OIL PO Take 1 capsule by mouth daily.   gabapentin 100 MG capsule Commonly known as: Neurontin Take 1 capsule (100 mg total) by mouth 3 (three) times daily as needed.   hydrochlorothiazide 25 MG tablet Commonly known as: HYDRODIURIL Take 25 mg by mouth Daily.   lovastatin 40 MG tablet Commonly known as: MEVACOR Take 40 mg by mouth daily.   methocarbamol 500 MG tablet Commonly known as: ROBAXIN Take 1 tablet (500 mg total) by mouth every 6 (six) hours as needed for muscle spasms.   MULTIVITAMIN PO  Take 1 tablet by mouth daily.   potassium chloride SA 20 MEQ tablet Commonly known as: KLOR-CON Take 1 tablet (20 mEq total) by mouth daily. What changed: when to take this   traMADol 50 MG tablet Commonly known as: ULTRAM Take 1 tablet (50 mg total) by mouth every 6 (six) hours as needed (mild pain).       Follow-up Information    Autumn Messing III, MD Follow up in 2 week(s).   Specialty: General Surgery Contact information: [place] [address] [phone]               Signed: Autumn Messing III 10/02/2020, 4:23 PM

## 2020-10-02 NOTE — Progress Notes (Signed)
[name] to be D/C'd  per MD order. Discussed with the patient and all questions fully answered.  VSS, Skin clean, dry and intact without evidence of skin break down, no evidence of skin tears noted other than surgical incision.  IV catheter discontinued intact. Site without signs and symptoms of complications. Dressing and pressure applied.  An After Visit Summary was printed and given to the patient. Patient received prescription.  D/c education completed with patient/family including follow up instructions, medication list, d/c activities limitations if indicated, with other d/c instructions as indicated by MD - patient able to verbalize understanding, all questions fully answered.   Patient instructed to return to ED, call 911, or call MD for any changes in condition.   Patient to be escorted via [place], and D/C home via private auto.

## 2020-10-03 LAB — SURGICAL PATHOLOGY

## 2020-10-04 ENCOUNTER — Encounter: Payer: Self-pay | Admitting: *Deleted

## 2020-10-04 NOTE — Progress Notes (Signed)
Path reviewed with Dr Marin Olp. He has no additional orders at this time.  Oncology Nurse Navigator Documentation  Oncology Nurse Navigator Flowsheets 10/04/2020  Abnormal Finding Date -  Confirmed Diagnosis Date -  Diagnosis Status -  Phase of Treatment -  Expected Surgery Date -  Surgery Actual Start Date: -  Navigator Follow Up Date: 10/28/2020  Navigator Follow Up Reason: Follow-up Appointment  Navigator Location [facility]  Navigator Encounter Type Pathology Review  Telephone -  Treatment Initiated Date -  Patient Visit Type MedOnc  Treatment Phase Active Tx  Barriers/Navigation Needs Coordination of Care;Education  Education -  Interventions None Required  Acuity Level 2-Minimal Needs (1-2 Barriers Identified)  Coordination of Care -  Education Method -  Support Groups/Services Friends and Family  Time Spent with Patient 15

## 2020-10-28 ENCOUNTER — Other Ambulatory Visit: Payer: Self-pay

## 2020-10-28 ENCOUNTER — Inpatient Hospital Stay (HOSPITAL_BASED_OUTPATIENT_CLINIC_OR_DEPARTMENT_OTHER): Payer: Medicare Other | Admitting: Hematology & Oncology

## 2020-10-28 ENCOUNTER — Inpatient Hospital Stay: Payer: Medicare Other | Attending: Family

## 2020-10-28 ENCOUNTER — Telehealth: Payer: Self-pay

## 2020-10-28 ENCOUNTER — Encounter: Payer: Self-pay | Admitting: Hematology & Oncology

## 2020-10-28 ENCOUNTER — Encounter: Payer: Self-pay | Admitting: *Deleted

## 2020-10-28 VITALS — BP 116/67 | HR 100 | Temp 98.9°F | Resp 18 | Wt 120.8 lb

## 2020-10-28 DIAGNOSIS — C50012 Malignant neoplasm of nipple and areola, left female breast: Secondary | ICD-10-CM

## 2020-10-28 DIAGNOSIS — Z853 Personal history of malignant neoplasm of breast: Secondary | ICD-10-CM

## 2020-10-28 DIAGNOSIS — C50912 Malignant neoplasm of unspecified site of left female breast: Secondary | ICD-10-CM | POA: Insufficient documentation

## 2020-10-28 DIAGNOSIS — Z170 Estrogen receptor positive status [ER+]: Secondary | ICD-10-CM | POA: Diagnosis not present

## 2020-10-28 DIAGNOSIS — Z79810 Long term (current) use of selective estrogen receptor modulators (SERMs): Secondary | ICD-10-CM | POA: Insufficient documentation

## 2020-10-28 DIAGNOSIS — E559 Vitamin D deficiency, unspecified: Secondary | ICD-10-CM | POA: Diagnosis not present

## 2020-10-28 DIAGNOSIS — N6320 Unspecified lump in the left breast, unspecified quadrant: Secondary | ICD-10-CM

## 2020-10-28 LAB — CMP (CANCER CENTER ONLY)
ALT: 11 U/L (ref 0–44)
AST: 21 U/L (ref 15–41)
Albumin: 4.1 g/dL (ref 3.5–5.0)
Alkaline Phosphatase: 85 U/L (ref 38–126)
Anion gap: 9 (ref 5–15)
BUN: 11 mg/dL (ref 8–23)
CO2: 30 mmol/L (ref 22–32)
Calcium: 9.6 mg/dL (ref 8.9–10.3)
Chloride: 100 mmol/L (ref 98–111)
Creatinine: 0.89 mg/dL (ref 0.44–1.00)
GFR, Estimated: >60 mL/min (ref >60)
Glucose, Bld: 120 mg/dL — ABNORMAL HIGH (ref 70–99)
Potassium: 3.3 mmol/L — ABNORMAL LOW (ref 3.5–5.1)
Sodium: 139 mmol/L (ref 135–145)
Total Bilirubin: 0.5 mg/dL (ref 0.3–1.2)
Total Protein: 7 g/dL (ref 6.5–8.1)

## 2020-10-28 LAB — CBC WITH DIFFERENTIAL (CANCER CENTER ONLY)
Abs Immature Granulocytes: 0.15 10*3/uL — ABNORMAL HIGH (ref 0.00–0.07)
Basophils Absolute: 0.1 10*3/uL (ref 0.0–0.1)
Basophils Relative: 1 %
Eosinophils Absolute: 0.3 10*3/uL (ref 0.0–0.5)
Eosinophils Relative: 3 %
HCT: 46 % (ref 36.0–46.0)
Hemoglobin: 15.3 g/dL — ABNORMAL HIGH (ref 12.0–15.0)
Immature Granulocytes: 1 %
Lymphocytes Relative: 22 %
Lymphs Abs: 2.4 10*3/uL (ref 0.7–4.0)
MCH: 29.2 pg (ref 26.0–34.0)
MCHC: 33.3 g/dL (ref 30.0–36.0)
MCV: 87.8 fL (ref 80.0–100.0)
Monocytes Absolute: 0.9 10*3/uL (ref 0.1–1.0)
Monocytes Relative: 8 %
Neutro Abs: 7.1 10*3/uL (ref 1.7–7.7)
Neutrophils Relative %: 65 %
Platelet Count: 275 10*3/uL (ref 150–400)
RBC: 5.24 MIL/uL — ABNORMAL HIGH (ref 3.87–5.11)
RDW: 12.6 % (ref 11.5–15.5)
WBC Count: 11 10*3/uL — ABNORMAL HIGH (ref 4.0–10.5)
nRBC: 0 % (ref 0.0–0.2)

## 2020-10-28 LAB — LACTATE DEHYDROGENASE: LDH: 203 U/L — ABNORMAL HIGH (ref 98–192)

## 2020-10-28 MED ORDER — TAMOXIFEN CITRATE 20 MG PO TABS: 20.0000 mg | ORAL_TABLET | Freq: Every day | ORAL | 4 refills | Status: DC

## 2020-10-28 MED ORDER — TAMOXIFEN CITRATE 20 MG PO TABS: 20.0000 mg | ORAL_TABLET | Freq: Every day | ORAL | 12 refills | Status: DC

## 2020-10-28 NOTE — Progress Notes (Signed)
Hematology and Oncology Follow Up Visit  Nancy Bell [id number] 1924/06/28 87 y.o. 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 daily-start on 10/29/2020   Interim History:  Nancy Bell is here today following her modified left radical mastectomy.  This was done on 09/30/2020.  At the time, she had noticed a large mass in the left breast.  This was ultimately biopsied and found to be invasive ductal carcinoma.  Patient went a modified radical mastectomy.  The pathology report ([id number]) showed invasive ductal carcinoma.  It was 7 cm.  All margins for negative.  3 lymph nodes were examined and these were all negative.  The tumor had good prognostic markers.  The tumor was ER positive and PR positive.  The tumor was HER2 negative.  Had a very low proliferation marker of 5%.  She is 87 years old.  There is [place] no indication as she needs radiation therapy.  I know that when she had breast cancer on the right breast 11 years ago, we try to put her on aromatase inhibitors and she tolerated these very poorly.  As such, we will try tamoxifen with her.  We will try 20 mg daily of tamoxifen.  She has recovered nicely from the surgery.  She has had some pain issues but these are better.  She is eating well.  She is having no problems with nausea or vomiting.  She is having no cough or shortness of breath.  Overall, I would have to say that her performance status is by ECOG 2.     Medications:  Allergies as of 10/28/2020       Reactions   Augmentin [amoxicillin-pot Clavulanate] Other (See Comments)   Develops C Diff   Vicodin [hydrocodone-acetaminophen] Other (See Comments)   Did not sleep for 72 hrs   Tylenol  [acetaminophen] Nausea And Vomiting   chills        Medication List        Accurate as of October 28, 2020 10:02 AM. If you have any questions, ask your nurse or doctor.          ALPRAZolam 0.25 MG tablet Commonly known as: XANAX Take 0.125 mg by mouth daily.   amLODipine 2.5 MG tablet Commonly known as: NORVASC Take 2.5 mg by mouth daily.   aspirin 81 MG tablet Take 81 mg by mouth daily.   CALCIUM PO Take 600 mg by mouth daily.   cholecalciferol 1000 units tablet Commonly known as: VITAMIN D Take 1,000 Units by mouth daily.   CO Q 10 PO Take 1 capsule by mouth daily.   FISH OIL PO Take 1 capsule by mouth daily.   gabapentin 100 MG capsule Commonly known as: Neurontin Take 1 capsule (100 mg total) by mouth 3 (three) times daily as needed.   hydrochlorothiazide 25 MG tablet Commonly known as: HYDRODIURIL Take 25 mg by mouth Daily.   lovastatin 40 MG tablet Commonly known as: MEVACOR Take 40 mg by mouth daily.   methocarbamol 500 MG tablet Commonly known as: ROBAXIN Take 1 tablet (500 mg total) by mouth every 6 (six) hours as needed for muscle spasms.   MULTIVITAMIN PO Take 1 tablet by mouth daily.   potassium chloride SA 20 MEQ  tablet Commonly known as: KLOR-CON Take 1 tablet (20 mEq total) by mouth daily. What changed: when to take this   traMADol 50 MG tablet Commonly known as: ULTRAM Take 1 tablet (50 mg total) by mouth every 6 (six) hours as needed (mild pain).        Allergies:  Allergies  Allergen Reactions   Augmentin [Amoxicillin-Pot Clavulanate] Other (See Comments)    Develops C Diff   Vicodin [Hydrocodone-Acetaminophen] Other (See Comments)    Did not sleep for 72 hrs   Tylenol [Acetaminophen] Nausea And Vomiting    chills    Past Medical History, Surgical history, Social history, and Family History were reviewed and updated.  Review of Systems: Review of Systems  Constitutional: Negative.   HENT: Negative.    Eyes:  Negative.   Respiratory: Negative.    Cardiovascular: Negative.   Gastrointestinal: Negative.   Genitourinary: Negative.   Musculoskeletal: Negative.   Skin: Negative.   Neurological: Negative.   Endo/Heme/Allergies: Negative.   Psychiatric/Behavioral: Negative.      Physical Exam:  weight is 120 lb 12.8 oz (54.8 kg). Her oral temperature is 98.9 F (37.2 C). Her blood pressure is 116/67 and her pulse is 100. Her respiration is 18 and oxygen saturation is 100%.   Wt Readings from Last 3 Encounters:  10/28/20 120 lb 12.8 oz (54.8 kg)  09/30/20 124 lb (56.2 kg)  09/24/20 124 lb 5 oz (56.4 kg)    Physical Exam Vitals reviewed.  Constitutional:      Comments: Her chest wall exam shows the left chest wall with the healing modified radical mastectomy scar.  She has no erythema or warmth.  She has no nodularity.  There is no left axillary adenopathy.  Right breast shows the lumpectomy which is well-healed at about the 3 o'clock position.  There is no right axillary adenopathy.  HENT:     Head: Normocephalic and atraumatic.  Eyes:     Pupils: Pupils are equal, round, and reactive to light.  Cardiovascular:     Rate and Rhythm: Normal rate and regular rhythm.     Heart sounds: Normal heart sounds.  Pulmonary:     Effort: Pulmonary effort is normal.     Breath sounds: Normal breath sounds.  Abdominal:     General: Bowel sounds are normal.     Palpations: Abdomen is soft.  Musculoskeletal:        General: No tenderness or deformity. Normal range of motion.     Cervical back: Normal range of motion.  Lymphadenopathy:     Cervical: No cervical adenopathy.  Skin:    General: Skin is warm and dry.     Findings: No erythema or rash.  Neurological:     Mental Status: She is alert and oriented to person, place, and time.  Psychiatric:        Behavior: Behavior normal.        Thought Content: Thought content normal.        Judgment: Judgment normal.      Lab Results  Component  Value Date   WBC 11.0 (H) 10/28/2020   HGB 15.3 (H) 10/28/2020   HCT 46.0 10/28/2020   MCV 87.8 10/28/2020   PLT 275 10/28/2020   No results found for: FERRITIN, IRON, TIBC, UIBC, IRONPCTSAT Lab Results  Component Value Date   RBC 5.24 (H) 10/28/2020   No results found for: KPAFRELGTCHN, LAMBDASER, KAPLAMBRATIO No results found for: IGGSERUM, IGA, IGMSERUM No results found for: TOTALPROTELP, ALBUMINELP,  Nils Flack, SPEI   Chemistry      Component Value Date/Time   NA 139 10/28/2020 0908   NA 146 (H) 02/15/2017 1008   NA 142 08/07/2016 0858   K 3.3 (L) 10/28/2020 0908   K 3.2 (L) 02/15/2017 1008   K 3.3 (L) 08/07/2016 0858   CL 100 10/28/2020 0908   CL 105 02/15/2017 1008   CO2 30 10/28/2020 0908   CO2 31 02/15/2017 1008   CO2 29 08/07/2016 0858   BUN 11 10/28/2020 0908   BUN 15 02/15/2017 1008   BUN 23.5 08/07/2016 0858   CREATININE 0.89 10/28/2020 0908   CREATININE 1.0 02/15/2017 1008   CREATININE 1.0 08/07/2016 0858      Component Value Date/Time   CALCIUM 9.6 10/28/2020 0908   CALCIUM 9.8 02/15/2017 1008   CALCIUM 10.0 08/07/2016 0858   ALKPHOS 85 10/28/2020 0908   ALKPHOS 80 02/15/2017 1008   ALKPHOS 84 08/07/2016 0858   AST 21 10/28/2020 0908   AST 24 08/07/2016 0858   ALT 11 10/28/2020 0908   ALT 20 02/15/2017 1008   ALT 14 08/07/2016 0858   BILITOT 0.5 10/28/2020 0908   BILITOT 0.50 08/07/2016 0858       Impression and Plan: Ms. Gieselman is a very pleasant 87 yo caucasian female with history of stage I ductal carcinoma of the right breast diagnosed in September 2011.   She had lumpectomy and radiation therapy.  We tried her on an aromatase inhibitor and she tolerated these poorly.  She now has a new breast cancer on the left side.  She had a modified left radical mastectomy.  She had a large tumor.  Thankfully it had good prognostic markers.  I think we can put her on tamoxifen.  I think she would be able to tolerate  tamoxifen.  Given her age, I do not see that she needs radiation therapy even though the tumor was quite large.  I am just surprised that she has developed a second breast cancer.  She comes in with her daughter.  She looks good.  I think that she will do fine.  I just do not believe that breast cancer will be a problem for her in the future.  I would like to see her back in about 6 weeks.  I do not see any need for any additional studies on her at the present time.    [name], MD 6/20/202210:02 AM

## 2020-10-28 NOTE — Progress Notes (Signed)
Patient here for her follow up appointment after mastectomy. She is an established patient with a new diagnosis.   Initial RN Navigator Patient Visit  Name: Nancy Bell Diagnosis:   Cancer of left female breast ([place])  Met with patient prior to their visit with MD. Hanley Seamen patient my business card with my contact information. She is healing well from her mastectomy, but does state that she occasionally will have shooting pains at her surgical site. Her daughter, who accompanied her to the appointment, felt like it was nerve pain.   We reviewed possible issues with lymphedema. She states she has no problem with swelling or ROM with her LUE. Educated her on protection of that arm - no tight clothing, no BP, no phlebotomy. Also encouraged her to notify the office if she started to develop any symptoms as earlier intervention would be more successful.  Patient completed visit with Dr. Marin Olp. She will initiate Tamoxifen. Radiation will be deferred.   Patient understands all follow up procedures and expectations. They have my number to reach out for any further clarification or additional needs.   Oncology Nurse Navigator Documentation  Oncology Nurse Navigator Flowsheets 10/28/2020  Abnormal Finding Date -  Confirmed Diagnosis Date -  Diagnosis Status -  Phase of Treatment AI  Aromatase Inhibitor Actual Start Date: 10/29/2020  Expected Surgery Date -  Surgery Actual Start Date: -  Navigator Follow Up Date: 12/16/2020  Navigator Follow Up Reason: Follow-up Appointment  Navigator Location [facility]  Navigator Encounter Type Initial MedOnc  Telephone -  Treatment Initiated Date 09/30/2020  Patient Visit Type MedOnc  Treatment Phase -  Barriers/Navigation Needs Coordination of Care;Education  Education Pain/ Symptom Management  Interventions Education;Psycho-Social Support  Acuity Level 2-Minimal Needs (1-2 Barriers Identified)  Coordination of Care -  Education Method Verbal   Support Groups/Services Friends and Family  Time Spent with Patient 30

## 2020-10-28 NOTE — Telephone Encounter (Signed)
Appts made and printed for pt per 6./20/22 los  Nancy Bell

## 2020-10-29 LAB — CANCER ANTIGEN 27.29: CA 27.29: 10.3 U/mL (ref 0.0–38.6)

## 2020-11-20 DIAGNOSIS — M810 Age-related osteoporosis without current pathological fracture: Secondary | ICD-10-CM | POA: Diagnosis not present

## 2020-11-20 DIAGNOSIS — Z79899 Other long term (current) drug therapy: Secondary | ICD-10-CM | POA: Diagnosis not present

## 2020-11-20 DIAGNOSIS — R7301 Impaired fasting glucose: Secondary | ICD-10-CM | POA: Diagnosis not present

## 2020-11-20 DIAGNOSIS — F419 Anxiety disorder, unspecified: Secondary | ICD-10-CM | POA: Diagnosis not present

## 2020-11-20 DIAGNOSIS — I10 Essential (primary) hypertension: Secondary | ICD-10-CM | POA: Diagnosis not present

## 2020-11-20 DIAGNOSIS — E7800 Pure hypercholesterolemia, unspecified: Secondary | ICD-10-CM | POA: Diagnosis not present

## 2020-11-20 DIAGNOSIS — Z0001 Encounter for general adult medical examination with abnormal findings: Secondary | ICD-10-CM | POA: Diagnosis not present

## 2020-11-20 DIAGNOSIS — R7309 Other abnormal glucose: Secondary | ICD-10-CM | POA: Diagnosis not present

## 2020-12-16 ENCOUNTER — Inpatient Hospital Stay (HOSPITAL_BASED_OUTPATIENT_CLINIC_OR_DEPARTMENT_OTHER): Payer: Medicare Other | Admitting: Hematology & Oncology

## 2020-12-16 ENCOUNTER — Inpatient Hospital Stay: Payer: Medicare Other | Attending: Family

## 2020-12-16 ENCOUNTER — Encounter: Payer: Self-pay | Admitting: Hematology & Oncology

## 2020-12-16 ENCOUNTER — Other Ambulatory Visit: Payer: Self-pay

## 2020-12-16 VITALS — BP 133/58 | HR 73 | Temp 98.9°F | Resp 18 | Wt 120.0 lb

## 2020-12-16 DIAGNOSIS — Z170 Estrogen receptor positive status [ER+]: Secondary | ICD-10-CM | POA: Insufficient documentation

## 2020-12-16 DIAGNOSIS — C50012 Malignant neoplasm of nipple and areola, left female breast: Secondary | ICD-10-CM

## 2020-12-16 DIAGNOSIS — Z79810 Long term (current) use of selective estrogen receptor modulators (SERMs): Secondary | ICD-10-CM | POA: Insufficient documentation

## 2020-12-16 DIAGNOSIS — C50812 Malignant neoplasm of overlapping sites of left female breast: Secondary | ICD-10-CM | POA: Insufficient documentation

## 2020-12-16 DIAGNOSIS — E559 Vitamin D deficiency, unspecified: Secondary | ICD-10-CM

## 2020-12-16 LAB — CMP (CANCER CENTER ONLY)
ALT: 8 U/L (ref 0–44)
AST: 18 U/L (ref 15–41)
Albumin: 4.2 g/dL (ref 3.5–5.0)
Alkaline Phosphatase: 70 U/L (ref 38–126)
Anion gap: 9 (ref 5–15)
BUN: 22 mg/dL (ref 8–23)
CO2: 30 mmol/L (ref 22–32)
Calcium: 9.8 mg/dL (ref 8.9–10.3)
Chloride: 104 mmol/L (ref 98–111)
Creatinine: 0.97 mg/dL (ref 0.44–1.00)
GFR, Estimated: 54 mL/min — ABNORMAL LOW (ref >60)
Glucose, Bld: 128 mg/dL — ABNORMAL HIGH (ref 70–99)
Potassium: 4.3 mmol/L (ref 3.5–5.1)
Sodium: 143 mmol/L (ref 135–145)
Total Bilirubin: 0.5 mg/dL (ref 0.3–1.2)
Total Protein: 6.9 g/dL (ref 6.5–8.1)

## 2020-12-16 LAB — CBC WITH DIFFERENTIAL (CANCER CENTER ONLY)
Abs Immature Granulocytes: 0.03 10*3/uL (ref 0.00–0.07)
Basophils Absolute: 0.1 10*3/uL (ref 0.0–0.1)
Basophils Relative: 1 %
Eosinophils Absolute: 0.2 10*3/uL (ref 0.0–0.5)
Eosinophils Relative: 2 %
HCT: 46.4 % — ABNORMAL HIGH (ref 36.0–46.0)
Hemoglobin: 15.1 g/dL — ABNORMAL HIGH (ref 12.0–15.0)
Immature Granulocytes: 0 %
Lymphocytes Relative: 22 %
Lymphs Abs: 2 10*3/uL (ref 0.7–4.0)
MCH: 29.2 pg (ref 26.0–34.0)
MCHC: 32.5 g/dL (ref 30.0–36.0)
MCV: 89.7 fL (ref 80.0–100.0)
Monocytes Absolute: 0.5 10*3/uL (ref 0.1–1.0)
Monocytes Relative: 6 %
Neutro Abs: 6.2 10*3/uL (ref 1.7–7.7)
Neutrophils Relative %: 69 %
Platelet Count: 237 10*3/uL (ref 150–400)
RBC: 5.17 MIL/uL — ABNORMAL HIGH (ref 3.87–5.11)
RDW: 13.1 % (ref 11.5–15.5)
WBC Count: 9 10*3/uL (ref 4.0–10.5)
nRBC: 0 % (ref 0.0–0.2)

## 2020-12-16 LAB — VITAMIN D 25 HYDROXY (VIT D DEFICIENCY, FRACTURES): Vit D, 25-Hydroxy: 50.53 ng/mL (ref 30–100)

## 2020-12-16 NOTE — Progress Notes (Signed)
Hematology and Oncology Follow Up Visit  Nancy Bell [id number] [birth year] 87 y.o. 12/16/2020   Principle Diagnosis:  Stage IIB (T3N0M0) invasive ductal carcinoma of the left breast --ER positive/PR positive/HER2 negative  Stage I (T1b N0 M0) infiltrating ductal carcinoma of the right breast in 2011   Current Therapy:   Status post left modified radical mastectomy -09/30/2020 Tamoxifen 20 mg p.o. daily-start on 10/29/2020   Interim History:  Nancy Bell is here today for follow-up.  She has she is doing quite well on the tamoxifen.  She has had no problems with tamoxifen.  She has had no problems with bleeding.  There is no sweats.  She has had no change in her mental state.  She [place] her [place] were [place]  [place]  They [place] wonderful time.  She has had no issues with nausea or vomiting.  There is no change in bowel or bladder habits.  She has had no cough or shortness of breath.  She is avoided the coronavirus.  Overall, I would say performance status is ECOG 1.     Medications:  Allergies as of 12/16/2020       Reactions   Augmentin [amoxicillin-pot Clavulanate] Other (See Comments)   Develops C Diff   Vicodin [hydrocodone-acetaminophen] Other (See Comments)   Did not sleep for 72 hrs   Tylenol [acetaminophen] Nausea And Vomiting   chills        Medication List        Accurate as of December 16, 2020 12:25 PM. If you have any questions, ask your nurse or doctor.          ALPRAZolam 0.25 MG tablet Commonly known as: XANAX Take 0.125 mg by mouth daily.   amLODipine 2.5 MG tablet Commonly known as: NORVASC Take 2.5 mg by mouth daily.   aspirin 81 MG tablet Take 81 mg by mouth daily.   CALCIUM PO Take 600 mg by mouth daily.   cholecalciferol 1000 units tablet Commonly known as: VITAMIN D Take 1,000 Units by mouth daily.   CO Q 10 PO Take 1 capsule by mouth daily.   FISH OIL PO Take 1 capsule by mouth daily.   gabapentin  100 MG capsule Commonly known as: Neurontin Take 1 capsule (100 mg total) by mouth 3 (three) times daily as needed.   hydrochlorothiazide 25 MG tablet Commonly known as: HYDRODIURIL Take 25 mg by mouth Daily.   lovastatin 40 MG tablet Commonly known as: MEVACOR Take 40 mg by mouth daily.   methocarbamol 500 MG tablet Commonly known as: ROBAXIN Take 1 tablet (500 mg total) by mouth every 6 (six) hours as needed for muscle spasms.   MULTIVITAMIN PO Take 1 tablet by mouth daily.   Potassium Chloride ER 20 MEQ Tbcr Take 1 tablet by mouth 2 (two) times daily.   potassium chloride SA 20 MEQ tablet Commonly known as: KLOR-CON Take 1 tablet (20 mEq total) by mouth daily. What changed: when to take this   tamoxifen 20 MG tablet Commonly known as: NOLVADEX Take 1 tablet (20 mg total) by mouth daily.   traMADol 50 MG tablet Commonly known as: ULTRAM Take 1 tablet (50 mg total) by mouth every 6 (six) hours as needed (mild pain).        Allergies:  Allergies  Allergen Reactions   Augmentin [Amoxicillin-Pot Clavulanate] Other (See Comments)    Develops C Diff   Vicodin [Hydrocodone-Acetaminophen] Other (See Comments)    Did  not sleep for 72 hrs   Tylenol [Acetaminophen] Nausea And Vomiting    chills    Past Medical History, Surgical history, Social history, and Family History were reviewed and updated.  Review of Systems: Review of Systems  Constitutional: Negative.   HENT: Negative.    Eyes: Negative.   Respiratory: Negative.    Cardiovascular: Negative.   Gastrointestinal: Negative.   Genitourinary: Negative.   Musculoskeletal: Negative.   Skin: Negative.   Neurological: Negative.   Endo/Heme/Allergies: Negative.   Psychiatric/Behavioral: Negative.      Physical Exam:  weight is 120 lb (54.4 kg). Her oral temperature is 98.9 F (37.2 C). Her blood pressure is 133/58 (abnormal) and her pulse is 73. Her respiration is 18 and oxygen saturation is 99%.   Wt  Readings from Last 3 Encounters:  12/16/20 120 lb (54.4 kg)  10/28/20 120 lb 12.8 oz (54.8 kg)  09/30/20 124 lb (56.2 kg)    Physical Exam Vitals reviewed.  Constitutional:      Comments: Her chest wall exam shows the left chest wall with the healing modified radical mastectomy scar.  She has no erythema or warmth.  She has no nodularity.  There is no left axillary adenopathy.  Right breast shows the lumpectomy which is well-healed at about the 3 o'clock position.  There is no right axillary adenopathy.  HENT:     Head: Normocephalic and atraumatic.  Eyes:     Pupils: Pupils are equal, round, and reactive to light.  Cardiovascular:     Rate and Rhythm: Normal rate and regular rhythm.     Heart sounds: Normal heart sounds.  Pulmonary:     Effort: Pulmonary effort is normal.     Breath sounds: Normal breath sounds.  Abdominal:     General: Bowel sounds are normal.     Palpations: Abdomen is soft.  Musculoskeletal:        General: No tenderness or deformity. Normal range of motion.     Cervical back: Normal range of motion.  Lymphadenopathy:     Cervical: No cervical adenopathy.  Skin:    General: Skin is warm and dry.     Findings: No erythema or rash.  Neurological:     Mental Status: She is alert and oriented to person, place, and time.  Psychiatric:        Behavior: Behavior normal.        Thought Content: Thought content normal.        Judgment: Judgment normal.      Lab Results  Component Value Date   WBC 9.0 12/16/2020   HGB 15.1 (H) 12/16/2020   HCT 46.4 (H) 12/16/2020   MCV 89.7 12/16/2020   PLT 237 12/16/2020   No results found for: FERRITIN, IRON, TIBC, UIBC, IRONPCTSAT Lab Results  Component Value Date   RBC 5.17 (H) 12/16/2020   No results found for: KPAFRELGTCHN, LAMBDASER, KAPLAMBRATIO No results found for: IGGSERUM, IGA, IGMSERUM No results found for: Odetta Pink, SPEI   Chemistry       Component Value Date/Time   NA 143 12/16/2020 1029   NA 146 (H) 02/15/2017 1008   NA 142 08/07/2016 0858   K 4.3 12/16/2020 1029   K 3.2 (L) 02/15/2017 1008   K 3.3 (L) 08/07/2016 0858   CL 104 12/16/2020 1029   CL 105 02/15/2017 1008   CO2 30 12/16/2020 1029   CO2 31 02/15/2017 1008   CO2 29 08/07/2016 0858  BUN 22 12/16/2020 1029   BUN 15 02/15/2017 1008   BUN 23.5 08/07/2016 0858   CREATININE 0.97 12/16/2020 1029   CREATININE 1.0 02/15/2017 1008   CREATININE 1.0 08/07/2016 0858      Component Value Date/Time   CALCIUM 9.8 12/16/2020 1029   CALCIUM 9.8 02/15/2017 1008   CALCIUM 10.0 08/07/2016 0858   ALKPHOS 70 12/16/2020 1029   ALKPHOS 80 02/15/2017 1008   ALKPHOS 84 08/07/2016 0858   AST 18 12/16/2020 1029   AST 24 08/07/2016 0858   ALT 8 12/16/2020 1029   ALT 20 02/15/2017 1008   ALT 14 08/07/2016 0858   BILITOT 0.5 12/16/2020 1029   BILITOT 0.50 08/07/2016 0858       Impression and Plan: Nancy Bell is a very pleasant 87 yo caucasian female with history of stage I ductal carcinoma of the right breast diagnosed in September 2011.   She had lumpectomy and radiation therapy.  We tried her on an aromatase inhibitor and she tolerated these poorly.  She now has a new breast cancer on the left side.  She had a modified left radical mastectomy.  She had a large tumor.  Thankfully it had good prognostic markers.  We will continue her on tamoxifen.  I think this certainly is worthwhile trying on her.  I think she should do well with the tamoxifen.  We will plan to get her back in 3 months at this point.   [name], MD 8/8/202212:25 PM

## 2020-12-17 ENCOUNTER — Encounter: Payer: Self-pay | Admitting: *Deleted

## 2020-12-17 NOTE — Progress Notes (Signed)
Oncology Nurse Navigator Documentation  Oncology Nurse Navigator Flowsheets 12/17/2020  Abnormal Finding Date -  Confirmed Diagnosis Date -  Diagnosis Status -  Phase of Treatment -  Aromatase Inhibitor Actual Start Date: -  Expected Surgery Date -  Surgery Actual Start Date: -  Navigator Follow Up Date: 03/13/2021  Navigator Follow Up Reason: Follow-up Appointment  Navigator Location [facility]  Navigator Encounter Type Appt/Treatment Plan Review  Telephone -  Treatment Initiated Date -  Patient Visit Type MedOnc  Treatment Phase Active Tx  Barriers/Navigation Needs No Barriers At This Time  Education -  Interventions None Required  Acuity Level 1-No Barriers  Coordination of Care -  Education Method -  Support Groups/Services Friends and Family  Time Spent with Patient 15

## 2021-03-05 DIAGNOSIS — Z23 Encounter for immunization: Secondary | ICD-10-CM | POA: Diagnosis not present

## 2021-03-13 ENCOUNTER — Other Ambulatory Visit: Payer: Self-pay

## 2021-03-13 ENCOUNTER — Inpatient Hospital Stay (HOSPITAL_BASED_OUTPATIENT_CLINIC_OR_DEPARTMENT_OTHER): Payer: Medicare Other | Admitting: Hematology & Oncology

## 2021-03-13 ENCOUNTER — Encounter: Payer: Self-pay | Admitting: *Deleted

## 2021-03-13 ENCOUNTER — Encounter: Payer: Self-pay | Admitting: Hematology & Oncology

## 2021-03-13 ENCOUNTER — Inpatient Hospital Stay: Payer: Medicare Other | Attending: Family

## 2021-03-13 VITALS — BP 149/59 | HR 92 | Temp 98.6°F | Resp 17 | Ht 61.0 in | Wt 117.0 lb

## 2021-03-13 DIAGNOSIS — Z923 Personal history of irradiation: Secondary | ICD-10-CM | POA: Insufficient documentation

## 2021-03-13 DIAGNOSIS — Z79810 Long term (current) use of selective estrogen receptor modulators (SERMs): Secondary | ICD-10-CM | POA: Diagnosis not present

## 2021-03-13 DIAGNOSIS — C50012 Malignant neoplasm of nipple and areola, left female breast: Secondary | ICD-10-CM | POA: Diagnosis not present

## 2021-03-13 DIAGNOSIS — Z170 Estrogen receptor positive status [ER+]: Secondary | ICD-10-CM | POA: Insufficient documentation

## 2021-03-13 DIAGNOSIS — C50812 Malignant neoplasm of overlapping sites of left female breast: Secondary | ICD-10-CM | POA: Insufficient documentation

## 2021-03-13 LAB — CMP (CANCER CENTER ONLY)
ALT: 8 U/L (ref 0–44)
AST: 18 U/L (ref 15–41)
Albumin: 4.2 g/dL (ref 3.5–5.0)
Alkaline Phosphatase: 61 U/L (ref 38–126)
Anion gap: 9 (ref 5–15)
BUN: 15 mg/dL (ref 8–23)
CO2: 30 mmol/L (ref 22–32)
Calcium: 10 mg/dL (ref 8.9–10.3)
Chloride: 102 mmol/L (ref 98–111)
Creatinine: 0.82 mg/dL (ref 0.44–1.00)
GFR, Estimated: >60 mL/min (ref >60)
Glucose, Bld: 142 mg/dL — ABNORMAL HIGH (ref 70–99)
Potassium: 3.3 mmol/L — ABNORMAL LOW (ref 3.5–5.1)
Sodium: 141 mmol/L (ref 135–145)
Total Bilirubin: 0.4 mg/dL (ref 0.3–1.2)
Total Protein: 6.8 g/dL (ref 6.5–8.1)

## 2021-03-13 LAB — CBC WITH DIFFERENTIAL (CANCER CENTER ONLY)
Abs Immature Granulocytes: 0.04 10*3/uL (ref 0.00–0.07)
Basophils Absolute: 0.1 10*3/uL (ref 0.0–0.1)
Basophils Relative: 1 %
Eosinophils Absolute: 0.2 10*3/uL (ref 0.0–0.5)
Eosinophils Relative: 2 %
HCT: 42.3 % (ref 36.0–46.0)
Hemoglobin: 13.9 g/dL (ref 12.0–15.0)
Immature Granulocytes: 0 %
Lymphocytes Relative: 28 %
Lymphs Abs: 2.8 10*3/uL (ref 0.7–4.0)
MCH: 29.6 pg (ref 26.0–34.0)
MCHC: 32.9 g/dL (ref 30.0–36.0)
MCV: 90 fL (ref 80.0–100.0)
Monocytes Absolute: 0.6 10*3/uL (ref 0.1–1.0)
Monocytes Relative: 6 %
Neutro Abs: 6.3 10*3/uL (ref 1.7–7.7)
Neutrophils Relative %: 63 %
Platelet Count: 236 10*3/uL (ref 150–400)
RBC: 4.7 MIL/uL (ref 3.87–5.11)
RDW: 12.6 % (ref 11.5–15.5)
WBC Count: 10 10*3/uL (ref 4.0–10.5)
nRBC: 0 % (ref 0.0–0.2)

## 2021-03-13 LAB — LACTATE DEHYDROGENASE: LDH: 184 U/L (ref 98–192)

## 2021-03-13 NOTE — Progress Notes (Signed)
Hematology and Oncology Follow Up Visit  Nancy Bell [id number] [birth year] 87 y.o. 03/13/2021   Principle Diagnosis:  Stage IIB (T3N0M0) invasive ductal carcinoma of the left breast --ER positive/PR positive/HER2 negative  Stage I (T1b N0 M0) infiltrating ductal carcinoma of the right breast in 2011   Current Therapy:   Status post left modified radical mastectomy -09/30/2020 Tamoxifen 20 mg p.o. daily-start on 10/29/2020   Interim History:  Nancy Bell is here today for follow-up.  Unfortunately, there was a death in the family recently.  A son-in-law passed on unexpectedly.  The funeral was yesterday.  Nancy Bell is doing well with tamoxifen.  She has had no problems with tamoxifen.  She has had no rashes.  There is been no leg swelling.  She has had no change in bowel or bladder habits.  She has had no watery eyes.  There is been no cough or shortness of breath.  Her appetite has been pretty good.  She has had no nausea or vomiting.  Has been no weight loss or weight gain.  Currently, I would say performance status is probably ECOG 1-2..     Medications:  Allergies as of 03/13/2021       Reactions   Augmentin [amoxicillin-pot Clavulanate] Other (See Comments)   Develops C Diff   Vicodin [hydrocodone-acetaminophen] Other (See Comments)   Did not sleep for 72 hrs   Tylenol [acetaminophen] Nausea And Vomiting   chills        Medication List        Accurate as of March 13, 2021  2:24 PM. If you have any questions, ask your nurse or doctor.          ALPRAZolam 0.25 MG tablet Commonly known as: XANAX Take 0.125 mg by mouth daily.   amLODipine 2.5 MG tablet Commonly known as: NORVASC Take 2.5 mg by mouth daily.   aspirin 81 MG tablet Take 81 mg by mouth daily.   CALCIUM PO Take 600 mg by mouth daily.   cholecalciferol 1000 units tablet Commonly known as: VITAMIN D Take 1,000 Units by mouth daily.   CO Q 10 PO Take 1 capsule by mouth daily.    FISH OIL PO Take 1 capsule by mouth daily.   gabapentin 100 MG capsule Commonly known as: Neurontin Take 1 capsule (100 mg total) by mouth 3 (three) times daily as needed.   hydrochlorothiazide 25 MG tablet Commonly known as: HYDRODIURIL Take 25 mg by mouth Daily.   lovastatin 40 MG tablet Commonly known as: MEVACOR Take 40 mg by mouth daily.   methocarbamol 500 MG tablet Commonly known as: ROBAXIN Take 1 tablet (500 mg total) by mouth every 6 (six) hours as needed for muscle spasms.   MULTIVITAMIN PO Take 1 tablet by mouth daily.   Potassium Chloride ER 20 MEQ Tbcr Take 1 tablet by mouth 2 (two) times daily.   potassium chloride SA 20 MEQ tablet Commonly known as: KLOR-CON Take 1 tablet (20 mEq total) by mouth daily. What changed: when to take this   tamoxifen 20 MG tablet Commonly known as: NOLVADEX Take 1 tablet (20 mg total) by mouth daily.   traMADol 50 MG tablet Commonly known as: ULTRAM Take 1 tablet (50 mg total) by mouth every 6 (six) hours as needed (mild pain).        Allergies:  Allergies  Allergen Reactions   Augmentin [Amoxicillin-Pot Clavulanate] Other (See Comments)    Develops C Diff   Vicodin [Hydrocodone-Acetaminophen] Other (  See Comments)    Did not sleep for 72 hrs   Tylenol [Acetaminophen] Nausea And Vomiting    chills    Past Medical History, Surgical history, Social history, and Family History were reviewed and updated.  Review of Systems: Review of Systems  Constitutional: Negative.   HENT: Negative.    Eyes: Negative.   Respiratory: Negative.    Cardiovascular: Negative.   Gastrointestinal: Negative.   Genitourinary: Negative.   Musculoskeletal: Negative.   Skin: Negative.   Neurological: Negative.   Endo/Heme/Allergies: Negative.   Psychiatric/Behavioral: Negative.      Physical Exam:  height is _0  (1.549 m) and weight is 117 lb (53.1 kg). Her oral temperature is 98.6 F (37 C). Her blood pressure is 149/59  (abnormal) and her pulse is 92. Her respiration is 17 and oxygen saturation is 98%.   Wt Readings from Last 3 Encounters:  03/13/21 117 lb (53.1 kg)  12/16/20 120 lb (54.4 kg)  10/28/20 120 lb 12.8 oz (54.8 kg)    Physical Exam Vitals reviewed.  Constitutional:      Comments: Her chest wall exam shows the left chest wall with the healing modified radical mastectomy scar.  She has no erythema or warmth.  She has no nodularity.  There is no left axillary adenopathy.  Right breast shows the lumpectomy which is well-healed at about the 3 o'clock position.  There is no right axillary adenopathy.  HENT:     Head: Normocephalic and atraumatic.  Eyes:     Pupils: Pupils are equal, round, and reactive to light.  Cardiovascular:     Rate and Rhythm: Normal rate and regular rhythm.     Heart sounds: Normal heart sounds.  Pulmonary:     Effort: Pulmonary effort is normal.     Breath sounds: Normal breath sounds.  Abdominal:     General: Bowel sounds are normal.     Palpations: Abdomen is soft.  Musculoskeletal:        General: No tenderness or deformity. Normal range of motion.     Cervical back: Normal range of motion.  Lymphadenopathy:     Cervical: No cervical adenopathy.  Skin:    General: Skin is warm and dry.     Findings: No erythema or rash.  Neurological:     Mental Status: She is alert and oriented to person, place, and time.  Psychiatric:        Behavior: Behavior normal.        Thought Content: Thought content normal.        Judgment: Judgment normal.      Lab Results  Component Value Date   WBC 10.0 03/13/2021   HGB 13.9 03/13/2021   HCT 42.3 03/13/2021   MCV 90.0 03/13/2021   PLT 236 03/13/2021   No results found for: FERRITIN, IRON, TIBC, UIBC, IRONPCTSAT Lab Results  Component Value Date   RBC 4.70 03/13/2021   No results found for: KPAFRELGTCHN, LAMBDASER, KAPLAMBRATIO No results found for: IGGSERUM, IGA, IGMSERUM No results found for: Odetta Pink, SPEI   Chemistry      Component Value Date/Time   NA 141 03/13/2021 1343   NA 146 (H) 02/15/2017 1008   NA 142 08/07/2016 0858   K 3.3 (L) 03/13/2021 1343   K 3.2 (L) 02/15/2017 1008   K 3.3 (L) 08/07/2016 0858   CL 102 03/13/2021 1343   CL 105 02/15/2017 1008   CO2 30 03/13/2021 1343  CO2 31 02/15/2017 1008   CO2 29 08/07/2016 0858   BUN 15 03/13/2021 1343   BUN 15 02/15/2017 1008   BUN 23.5 08/07/2016 0858   CREATININE 0.82 03/13/2021 1343   CREATININE 1.0 02/15/2017 1008   CREATININE 1.0 08/07/2016 0858      Component Value Date/Time   CALCIUM 10.0 03/13/2021 1343   CALCIUM 9.8 02/15/2017 1008   CALCIUM 10.0 08/07/2016 0858   ALKPHOS 61 03/13/2021 1343   ALKPHOS 80 02/15/2017 1008   ALKPHOS 84 08/07/2016 0858   AST 18 03/13/2021 1343   AST 24 08/07/2016 0858   ALT 8 03/13/2021 1343   ALT 20 02/15/2017 1008   ALT 14 08/07/2016 0858   BILITOT 0.4 03/13/2021 1343   BILITOT 0.50 08/07/2016 0858       Impression and Plan: Ms. Leske is a very pleasant 87 yo caucasian female with history of stage I ductal carcinoma of the right breast diagnosed in September 2011.   She had lumpectomy and radiation therapy.  We tried her on an aromatase inhibitor and she tolerated these poorly.  She now has a new breast cancer on the left side.  She had a modified left radical mastectomy.  She had a large tumor.  Thankfully it had good prognostic markers.  She had a mastectomy on 09/30/2020.  We will continue her on tamoxifen.  I think this certainly is worthwhile trying on her.  I think she should do well with the tamoxifen.  We will plan to get her back in 3 months at this point.   [name], MD 11/3/20222:24 PM

## 2021-03-13 NOTE — Progress Notes (Signed)
Oncology Nurse Navigator Documentation  Oncology Nurse Navigator Flowsheets 03/13/2021  Abnormal Finding Date -  Confirmed Diagnosis Date -  Diagnosis Status -  Phase of Treatment -  Aromatase Inhibitor Actual Start Date: -  Expected Surgery Date -  Surgery Actual Start Date: -  Navigator Follow Up Date: 07/03/2021  Navigator Follow Up Reason: Follow-up Appointment  Navigator Location [facility]  Navigator Encounter Type Appt/Treatment Plan Review  Telephone -  Treatment Initiated Date -  Patient Visit Type MedOnc  Treatment Phase Active Tx  Barriers/Navigation Needs No Barriers At This Time  Education -  Interventions None Required  Acuity Level 1-No Barriers  Coordination of Care -  Education Method -  Support Groups/Services Friends and Family  Time Spent with Patient 15

## 2021-06-10 DIAGNOSIS — U071 COVID-19: Secondary | ICD-10-CM | POA: Diagnosis not present

## 2021-07-03 ENCOUNTER — Ambulatory Visit: Payer: Medicare Other | Admitting: Hematology & Oncology

## 2021-07-03 ENCOUNTER — Encounter: Payer: Self-pay | Admitting: *Deleted

## 2021-07-03 ENCOUNTER — Inpatient Hospital Stay: Payer: Medicare Other

## 2021-07-03 NOTE — Progress Notes (Signed)
Patient's appointment cancelled for today as she has been diagnosed with Covid.   Oncology Nurse Navigator Documentation  Oncology Nurse Navigator Flowsheets 07/03/2021  Abnormal Finding Date -  Confirmed Diagnosis Date -  Diagnosis Status -  Phase of Treatment -  Aromatase Inhibitor Actual Start Date: -  Expected Surgery Date -  Surgery Actual Start Date: -  Navigator Follow Up Date: 08/01/2021  Navigator Follow Up Reason: Follow-up Appointment  Navigator Location [facility]  Navigator Encounter Type Appt/Treatment Plan Review  Telephone -  Treatment Initiated Date -  Patient Visit Type MedOnc  Treatment Phase Active Tx  Barriers/Navigation Needs No Barriers At This Time  Education -  Interventions None Required  Acuity Level 1-No Barriers  Coordination of Care -  Education Method -  Support Groups/Services Friends and Family  Time Spent with Patient 15

## 2021-08-01 ENCOUNTER — Inpatient Hospital Stay (HOSPITAL_BASED_OUTPATIENT_CLINIC_OR_DEPARTMENT_OTHER): Payer: Medicare Other | Admitting: Hematology & Oncology

## 2021-08-01 ENCOUNTER — Inpatient Hospital Stay: Payer: Medicare Other | Attending: Hematology & Oncology

## 2021-08-01 ENCOUNTER — Other Ambulatory Visit: Payer: Self-pay

## 2021-08-01 ENCOUNTER — Encounter: Payer: Self-pay | Admitting: Hematology & Oncology

## 2021-08-01 ENCOUNTER — Encounter: Payer: Self-pay | Admitting: *Deleted

## 2021-08-01 VITALS — BP 142/57 | HR 91 | Temp 98.2°F | Resp 18 | Wt 112.0 lb

## 2021-08-01 DIAGNOSIS — C50012 Malignant neoplasm of nipple and areola, left female breast: Secondary | ICD-10-CM | POA: Diagnosis not present

## 2021-08-01 DIAGNOSIS — Z170 Estrogen receptor positive status [ER+]: Secondary | ICD-10-CM | POA: Insufficient documentation

## 2021-08-01 DIAGNOSIS — Z79810 Long term (current) use of selective estrogen receptor modulators (SERMs): Secondary | ICD-10-CM | POA: Diagnosis not present

## 2021-08-01 DIAGNOSIS — Z923 Personal history of irradiation: Secondary | ICD-10-CM | POA: Diagnosis not present

## 2021-08-01 DIAGNOSIS — R059 Cough, unspecified: Secondary | ICD-10-CM | POA: Insufficient documentation

## 2021-08-01 DIAGNOSIS — C50912 Malignant neoplasm of unspecified site of left female breast: Secondary | ICD-10-CM | POA: Diagnosis not present

## 2021-08-01 LAB — CMP (CANCER CENTER ONLY)
ALT: 8 U/L (ref 0–44)
AST: 18 U/L (ref 15–41)
Albumin: 4.3 g/dL (ref 3.5–5.0)
Alkaline Phosphatase: 54 U/L (ref 38–126)
Anion gap: 9 (ref 5–15)
BUN: 19 mg/dL (ref 8–23)
CO2: 31 mmol/L (ref 22–32)
Calcium: 9.7 mg/dL (ref 8.9–10.3)
Chloride: 100 mmol/L (ref 98–111)
Creatinine: 0.85 mg/dL (ref 0.44–1.00)
GFR, Estimated: >60 mL/min (ref >60)
Glucose, Bld: 101 mg/dL — ABNORMAL HIGH (ref 70–99)
Potassium: 3 mmol/L — ABNORMAL LOW (ref 3.5–5.1)
Sodium: 140 mmol/L (ref 135–145)
Total Bilirubin: 0.5 mg/dL (ref 0.3–1.2)
Total Protein: 7.1 g/dL (ref 6.5–8.1)

## 2021-08-01 LAB — LACTATE DEHYDROGENASE: LDH: 185 U/L (ref 98–192)

## 2021-08-01 LAB — CBC WITH DIFFERENTIAL (CANCER CENTER ONLY)
Abs Immature Granulocytes: 0.04 10*3/uL (ref 0.00–0.07)
Basophils Absolute: 0.1 10*3/uL (ref 0.0–0.1)
Basophils Relative: 1 %
Eosinophils Absolute: 0.1 10*3/uL (ref 0.0–0.5)
Eosinophils Relative: 1 %
HCT: 42 % (ref 36.0–46.0)
Hemoglobin: 14.2 g/dL (ref 12.0–15.0)
Immature Granulocytes: 0 %
Lymphocytes Relative: 28 %
Lymphs Abs: 3.1 10*3/uL (ref 0.7–4.0)
MCH: 30 pg (ref 26.0–34.0)
MCHC: 33.8 g/dL (ref 30.0–36.0)
MCV: 88.8 fL (ref 80.0–100.0)
Monocytes Absolute: 0.8 10*3/uL (ref 0.1–1.0)
Monocytes Relative: 7 %
Neutro Abs: 7.1 10*3/uL (ref 1.7–7.7)
Neutrophils Relative %: 63 %
Platelet Count: 253 10*3/uL (ref 150–400)
RBC: 4.73 MIL/uL (ref 3.87–5.11)
RDW: 12.3 % (ref 11.5–15.5)
WBC Count: 11.3 10*3/uL — ABNORMAL HIGH (ref 4.0–10.5)
nRBC: 0 % (ref 0.0–0.2)

## 2021-08-01 NOTE — Progress Notes (Signed)
?Hematology and Oncology Follow Up Visit ? ?Nancy Bell ?[id number] ?01/20/1925 88 y.o. ?08/01/2021 ? ? ?Principle Diagnosis:  ?Stage IIB (T3N0M0) invasive ductal carcinoma of the left breast --ER positive/PR positive/HER2 negative ? ?Stage I (T1b N0 M0) infiltrating ductal carcinoma of the right breast in 2011 ?  ?Current Therapy:   ?Status post left modified radical mastectomy -09/30/2020 ?Tamoxifen 20 mg p.o. daily-start on 10/29/2020 ?  ?Interim History:  Nancy Bell is here today for follow-up.  She comes in with her daughter.  She also comes in with some art work that was done by granddaughter.  I am very impressed with the abilities of this little 88 year old girl. ? ?Otherwise, she seems to be doing pretty well.  We last saw her back in the October.  She has had no problems over the Christmas holiday.  ? ?She had COVID about a month ago.  She says she was quite sick.  She had a bad cough.  She, thankfully, was not hospitalized.  She was not put on any anti-COVID agents.   ? ?Her weight has been going down a little bit.  This needs to be watched closely.  She says she is eating okay.  There is no nausea or vomiting.  She has had no change in bowel or bladder habits.  She has had no bleeding.  There is been no fever.  She has had no rashes.  There is been no leg swelling. ? ?Overall, I would say her performance status is probably ECOG 2.   ? ? ?Medications:  ?Allergies as of 08/01/2021   ? ?   Reactions  ? Augmentin [amoxicillin-pot Clavulanate] Other (See Comments)  ? Develops C Diff  ? Vicodin [hydrocodone-acetaminophen] Other (See Comments)  ? Did not sleep for 72 hrs  ? Amoxicillin-pot Clavulanate Other (See Comments)  ? Naproxen Other (See Comments)  ? Oxycodone Hcl Other (See Comments)  ? Tramadol Hcl Other (See Comments)  ? Tylenol [acetaminophen] Nausea And Vomiting  ? chills  ? ?  ? ?  ?Medication List  ?  ? ?  ? Accurate as of August 01, 2021 12:53 PM. If you have any questions, ask your nurse or  doctor.  ?  ?  ? ?  ? ?STOP taking these medications   ? ?methocarbamol 500 MG tablet ?Commonly known as: ROBAXIN ?Stopped by: [name], MD ?  ? ?  ? ?TAKE these medications   ? ?ALPRAZolam 0.25 MG tablet ?Commonly known as: Duanne Moron ?Take 0.125 mg by mouth daily. ?  ?amLODipine 2.5 MG tablet ?Commonly known as: NORVASC ?Take 2.5 mg by mouth daily. ?  ?aspirin 81 MG tablet ?Take 81 mg by mouth daily. ?  ?CALCIUM PO ?Take 600 mg by mouth daily. ?  ?cholecalciferol 1000 units tablet ?Commonly known as: VITAMIN D ?Take 1,000 Units by mouth daily. ?  ?CO Q 10 PO ?Take 1 capsule by mouth daily. ?  ?FISH OIL PO ?Take 1 capsule by mouth daily. ?  ?gabapentin 100 MG capsule ?Commonly known as: Neurontin ?Take 1 capsule (100 mg total) by mouth 3 (three) times daily as needed. ?  ?hydrochlorothiazide 25 MG tablet ?Commonly known as: HYDRODIURIL ?Take 25 mg by mouth Daily. ?  ?lovastatin 40 MG tablet ?Commonly known as: MEVACOR ?Take 40 mg by mouth daily. ?  ?MULTIVITAMIN PO ?Take 1 tablet by mouth daily. ?  ?Potassium Chloride ER 20 MEQ Tbcr ?Take 1 tablet by mouth 2 (two) times daily. ?  ?potassium chloride SA  20 MEQ tablet ?Commonly known as: KLOR-CON M ?Take 1 tablet (20 mEq total) by mouth daily. ?What changed: when to take this ?  ?tamoxifen 20 MG tablet ?Commonly known as: NOLVADEX ?Take 1 tablet (20 mg total) by mouth daily. ?  ?traMADol 50 MG tablet ?Commonly known as: ULTRAM ?Take 1 tablet (50 mg total) by mouth every 6 (six) hours as needed (mild pain). ?  ? ?  ? ? ?Allergies:  ?Allergies  ?Allergen Reactions  ? Augmentin [Amoxicillin-Pot Clavulanate] Other (See Comments)  ?  Develops C Diff  ? Vicodin [Hydrocodone-Acetaminophen] Other (See Comments)  ?  Did not sleep for 72 hrs  ? Amoxicillin-Pot Clavulanate Other (See Comments)  ? Naproxen Other (See Comments)  ? Oxycodone Hcl Other (See Comments)  ? Tramadol Hcl Other (See Comments)  ? Tylenol [Acetaminophen] Nausea And Vomiting  ?  chills  ? ? ?Past Medical  History, Surgical history, Social history, and Family History were reviewed and updated. ? ?Review of Systems: ?Review of Systems  ?Constitutional: Negative.   ?HENT: Negative.    ?Eyes: Negative.   ?Respiratory: Negative.    ?Cardiovascular: Negative.   ?Gastrointestinal: Negative.   ?Genitourinary: Negative.   ?Musculoskeletal: Negative.   ?Skin: Negative.   ?Neurological: Negative.   ?Endo/Heme/Allergies: Negative.   ?Psychiatric/Behavioral: Negative.    ? ? ?Physical Exam: ? weight is 112 lb (50.8 kg). Her oral temperature is 98.2 ?F (36.8 ?C). Her blood pressure is 142/57 (abnormal) and her pulse is 91. Her respiration is 18 and oxygen saturation is 100%.  ? ?Wt Readings from Last 3 Encounters:  ?08/01/21 112 lb (50.8 kg)  ?03/13/21 117 lb (53.1 kg)  ?12/16/20 120 lb (54.4 kg)  ? ? ?Physical Exam ?Vitals reviewed.  ?Constitutional:   ?   Comments: Her chest wall exam shows the left chest wall with the healing modified radical mastectomy scar.  She has no erythema or warmth.  She has no nodularity.  There is no left axillary adenopathy.  Right breast shows the lumpectomy which is well-healed at about the 3 o'clock position.  There is no right axillary adenopathy.  ?HENT:  ?   Head: Normocephalic and atraumatic.  ?Eyes:  ?   Pupils: Pupils are equal, round, and reactive to light.  ?Cardiovascular:  ?   Rate and Rhythm: Normal rate and regular rhythm.  ?   Heart sounds: Normal heart sounds.  ?Pulmonary:  ?   Effort: Pulmonary effort is normal.  ?   Breath sounds: Normal breath sounds.  ?Abdominal:  ?   General: Bowel sounds are normal.  ?   Palpations: Abdomen is soft.  ?Musculoskeletal:     ?   General: No tenderness or deformity. Normal range of motion.  ?   Cervical back: Normal range of motion.  ?Lymphadenopathy:  ?   Cervical: No cervical adenopathy.  ?Skin: ?   General: Skin is warm and dry.  ?   Findings: No erythema or rash.  ?Neurological:  ?   Mental Status: She is alert and oriented to person, place,  and time.  ?Psychiatric:     ?   Behavior: Behavior normal.     ?   Thought Content: Thought content normal.     ?   Judgment: Judgment normal.  ? ?  ? ?Lab Results  ?Component Value Date  ? WBC 11.3 (H) 08/01/2021  ? HGB 14.2 08/01/2021  ? HCT 42.0 08/01/2021  ? MCV 88.8 08/01/2021  ? PLT 253 08/01/2021  ? ?  No results found for: FERRITIN, IRON, TIBC, UIBC, IRONPCTSAT ?Lab Results  ?Component Value Date  ? RBC 4.73 08/01/2021  ? ?No results found for: KPAFRELGTCHN, LAMBDASER, KAPLAMBRATIO ?No results found for: IGGSERUM, IGA, IGMSERUM ?No results found for: TOTALPROTELP, ALBUMINELP, A1GS, A2GS, BETS, BETA2SER, GAMS, MSPIKE, SPEI ?  Chemistry   ?   ?Component Value Date/Time  ? NA 140 08/01/2021 1209  ? NA 146 (H) 02/15/2017 1008  ? NA 142 08/07/2016 0858  ? K 3.0 (L) 08/01/2021 1209  ? K 3.2 (L) 02/15/2017 1008  ? K 3.3 (L) 08/07/2016 0858  ? CL 100 08/01/2021 1209  ? CL 105 02/15/2017 1008  ? CO2 31 08/01/2021 1209  ? CO2 31 02/15/2017 1008  ? CO2 29 08/07/2016 0858  ? BUN 19 08/01/2021 1209  ? BUN 15 02/15/2017 1008  ? BUN 23.5 08/07/2016 0858  ? CREATININE 0.85 08/01/2021 1209  ? CREATININE 1.0 02/15/2017 1008  ? CREATININE 1.0 08/07/2016 0858  ?    ?Component Value Date/Time  ? CALCIUM 9.7 08/01/2021 1209  ? CALCIUM 9.8 02/15/2017 1008  ? CALCIUM 10.0 08/07/2016 0858  ? ALKPHOS 54 08/01/2021 1209  ? ALKPHOS 80 02/15/2017 1008  ? ALKPHOS 84 08/07/2016 0858  ? AST 18 08/01/2021 1209  ? AST 24 08/07/2016 0858  ? ALT 8 08/01/2021 1209  ? ALT 20 02/15/2017 1008  ? ALT 14 08/07/2016 0858  ? BILITOT 0.5 08/01/2021 1209  ? BILITOT 0.50 08/07/2016 0858  ?  ? ? ? ?Impression and Plan: Nancy Bell is a very pleasant 88 yo caucasian female with history of stage I ductal carcinoma of the right breast diagnosed in September 2011.  ? ?She had lumpectomy and radiation therapy.  We tried her on an aromatase inhibitor and she tolerated these poorly. ? ?She then developed a new breast cancer on the left side.  She had a modified  left radical mastectomy.  She had a large tumor.  Thankfully it had good prognostic markers.  She had a mastectomy on 09/30/2020. ? ?We will continue her on tamoxifen.  She seems to be tolerating the tamoxife

## 2021-08-01 NOTE — Progress Notes (Signed)
Oncology Nurse Navigator Documentation ? ? ?  08/01/2021  ? 12:30 PM  ?Oncology Nurse Navigator Flowsheets  ?Navigation Complete Date: 08/01/2021  ?Post Navigation: Continue to Follow Patient? No  ?Reason Not Navigating Patient: Patient On Maintenance Chemotherapy  ?Navigator Location [facility]  ?Navigator Encounter Type Appt/Treatment Plan Review  ?Patient Visit Type MedOnc  ?Treatment Phase Active Tx  ?Barriers/Navigation Needs No Barriers At This Time  ?Interventions None Required  ?Acuity Level 1-No Barriers  ?Support Groups/Services Friends and Family  ?Time Spent with Patient 15  ?  ?

## 2021-09-29 NOTE — Progress Notes (Unsigned)
    Subjective:    CC: Low back pain  I, Nancy Bell, LAT, ATC, am serving as scribe for Dr. Lynne Leader.  HPI: Pt is a 88 y/o female presenting w/ c/o low back pain x .  She locates her pain to .  Of note, pt is currently being treated for L breast CA and had a L breast radical mastectomy.  Radiating pain: LE paresthesias: Aggravating factors: Treatments tried:   Pertinent review of Systems: ***  Relevant historical information: ***   Objective:   There were no vitals filed for this visit. General: Well Developed, well nourished, and in no acute distress.   MSK: ***  Lab and Radiology Results No results found for this or any previous visit (from the past 72 hour(s)). No results found.    Impression and Recommendations:    Assessment and Plan: 88 y.o. female with ***.  PDMP not reviewed this encounter. No orders of the defined types were placed in this encounter.  No orders of the defined types were placed in this encounter.   Discussed warning signs or symptoms. Please see discharge instructions. Patient expresses understanding.   ***

## 2021-09-30 ENCOUNTER — Ambulatory Visit (INDEPENDENT_AMBULATORY_CARE_PROVIDER_SITE_OTHER): Payer: Medicare Other

## 2021-09-30 ENCOUNTER — Ambulatory Visit (INDEPENDENT_AMBULATORY_CARE_PROVIDER_SITE_OTHER): Payer: Medicare Other | Admitting: Family Medicine

## 2021-09-30 ENCOUNTER — Encounter: Payer: Self-pay | Admitting: Family Medicine

## 2021-09-30 VITALS — BP 120/70 | HR 81 | Ht 61.0 in | Wt 114.8 lb

## 2021-09-30 DIAGNOSIS — R42 Dizziness and giddiness: Secondary | ICD-10-CM | POA: Diagnosis not present

## 2021-09-30 DIAGNOSIS — I700 Atherosclerosis of aorta: Secondary | ICD-10-CM | POA: Diagnosis not present

## 2021-09-30 DIAGNOSIS — G8929 Other chronic pain: Secondary | ICD-10-CM

## 2021-09-30 DIAGNOSIS — I10 Essential (primary) hypertension: Secondary | ICD-10-CM | POA: Insufficient documentation

## 2021-09-30 DIAGNOSIS — R739 Hyperglycemia, unspecified: Secondary | ICD-10-CM | POA: Insufficient documentation

## 2021-09-30 DIAGNOSIS — E7800 Pure hypercholesterolemia, unspecified: Secondary | ICD-10-CM | POA: Insufficient documentation

## 2021-09-30 DIAGNOSIS — M545 Low back pain, unspecified: Secondary | ICD-10-CM | POA: Diagnosis not present

## 2021-09-30 DIAGNOSIS — M810 Age-related osteoporosis without current pathological fracture: Secondary | ICD-10-CM | POA: Insufficient documentation

## 2021-09-30 DIAGNOSIS — R1310 Dysphagia, unspecified: Secondary | ICD-10-CM | POA: Insufficient documentation

## 2021-09-30 DIAGNOSIS — F419 Anxiety disorder, unspecified: Secondary | ICD-10-CM | POA: Insufficient documentation

## 2021-09-30 NOTE — Patient Instructions (Addendum)
Nice to meet you.  I've referred you to Physical Therapy.  Their office will call you to schedule but please let us know if you don't hear from them in one week regarding scheduling.  Follow-up: 6 weeks

## 2021-10-01 NOTE — Progress Notes (Signed)
Lumbar spine x-ray shows arthritis changes in the low back along with some scoliosis changes.

## 2021-10-20 ENCOUNTER — Ambulatory Visit: Payer: Medicare Other | Admitting: Physical Therapy

## 2021-10-29 ENCOUNTER — Ambulatory Visit: Payer: Medicare Other | Attending: Family Medicine | Admitting: Physical Therapy

## 2021-10-29 ENCOUNTER — Encounter: Payer: Self-pay | Admitting: Physical Therapy

## 2021-10-29 DIAGNOSIS — M545 Low back pain, unspecified: Secondary | ICD-10-CM | POA: Insufficient documentation

## 2021-10-29 DIAGNOSIS — R2681 Unsteadiness on feet: Secondary | ICD-10-CM | POA: Diagnosis not present

## 2021-10-29 DIAGNOSIS — G8929 Other chronic pain: Secondary | ICD-10-CM | POA: Diagnosis not present

## 2021-10-29 DIAGNOSIS — M6281 Muscle weakness (generalized): Secondary | ICD-10-CM | POA: Diagnosis not present

## 2021-10-29 DIAGNOSIS — R262 Difficulty in walking, not elsewhere classified: Secondary | ICD-10-CM | POA: Insufficient documentation

## 2021-10-29 DIAGNOSIS — R278 Other lack of coordination: Secondary | ICD-10-CM | POA: Diagnosis not present

## 2021-10-29 DIAGNOSIS — R293 Abnormal posture: Secondary | ICD-10-CM | POA: Insufficient documentation

## 2021-10-29 DIAGNOSIS — R42 Dizziness and giddiness: Secondary | ICD-10-CM | POA: Insufficient documentation

## 2021-10-29 NOTE — Therapy (Signed)
OUTPATIENT PHYSICAL THERAPY THORACOLUMBAR EVALUATION   Patient Name: OMAR ORREGO MRN: [id number] DOB:1925/02/26, 88 y.o., female Today's Date: 10/29/2021   PT End of Session - 10/29/21 1407     Visit Number 1    Date for PT Re-Evaluation 01/07/22    PT Start Time 1232    PT Stop Time 1314    PT Time Calculation (min) 42 min    Activity Tolerance Patient tolerated treatment well    Behavior During Therapy [facility] for tasks assessed/performed             Past Medical History:  Diagnosis Date   Anxiety    Cancer ([place])    right breast   History of hiatal hernia    Hyperlipidemia    Hypertension    Past Surgical History:  Procedure Laterality Date   BREAST BIOPSY  2 weeks ago   BREAST LUMPECTOMY  01/29/2010   Right- Dr Margot Chimes   TOOTH EXTRACTION     TOTAL MASTECTOMY Left 09/30/2020   Procedure: LEFT TOTAL MASTECTOMY;  Surgeon: Jovita Kussmaul, MD;  Location: [place];  Service: General;  Laterality: Left;   Patient Active Problem List   Diagnosis Date Noted   Age-related osteoporosis without current pathological fracture 09/30/2021   Anxiety disorder 09/30/2021   Dysphagia 09/30/2021   Essential hypertension 09/30/2021   Pure hypercholesterolemia 09/30/2021   Hyperglycemia 09/30/2021   Atherosclerosis of abdominal aorta ([place]) 09/30/2021   Cancer of left female breast ([place]) 09/30/2020   Hx breast cancer, IDC right LIQ,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    PCP: Dorthy Cooler, [place]  REFERRING PROVIDER: Gregor Hams, MD    REFERRING DIAG: M54.50,G89.29 (ICD-10-CM) - Chronic left-sided low back pain without sciatica R42 (ICD-10-CM) - [place]  Rationale for Evaluation and Treatment Rehabilitation  THERAPY DIAG:  Abnormal posture  Difficulty in walking, not elsewhere classified  Muscle weakness (generalized)  Other lack of coordination  Unsteadiness on feet  Chronic right-sided low back pain without sciatica  ONSET DATE: 09/30/2021   SUBJECTIVE:                                                                                                                                                                                            SUBJECTIVE STATEMENT: Patient reports that she occasionally has some occasional dizziness, usually upon standing too quickly. She waits for it to pass. She has a lower back injury that flares up occasionally, but it goes away on its own and does not limit her. PERTINENT HISTORY:  Dizziness and impaired balance. She is a good candidate for vestibular physical therapy. She is at  risk for falls and limited PT should help. We discussed the use of a walker and she is adamant about not using 1 for now at least   PAIN:  Are you having pain? No:    FALLS:  Has patient fallen in last 6 months? No  LIVING ENVIRONMENT: Lives with: lives with their family Lives in: House/apartment Stairs: Yes: External: 4 steps; on left going up Has following equipment at home: None  OCCUPATION: Retired  PLOF: [place] Patient unable to state any goals because she does not feel like she is struggling in any way.   OBJECTIVE:   DIAGNOSTIC FINDINGS:  X-ray images L-spine obtained today personally and independently interpreted. Diffuse degenerative changes.  Significant DDD and facet DJD L5-S1 with spondylosis at L4-L5. Abdominal aortic atherosclerosis present. Calcified fibroid present. No acute fractures are present. No aggressive appearing bony lesions are present. Await formal radiology review    SCREENING FOR RED FLAGS: Bowel or bladder incontinence: No Spinal tumors: No Cauda equina syndrome: No Compression fracture: No Abdominal aneurysm: No  COGNITION:  Overall cognitive status: Within functional limits for tasks assessed     SENSATION: Not tested  MUSCLE LENGTH: Hamstrings: Right 80 deg; Left 80 deg   POSTURE: rounded shoulders, decreased lumbar lordosis, and decreased thoracic  kyphosis  PALPATION: Patient is tight and TTP over R lower back inside the iliac crest, mildly tight along lumbar paraspinals on R.  LUMBAR ROM:   Active  A/PROM  eval  Flexion To floor  Extension Mildly impaired  Right lateral flexion Mildly impaired  Left lateral flexion Mildly impaired  Right rotation WFL  Left rotation WFL   (Blank rows = not tested)  LOWER EXTREMITY ROM:     Passive  Right eval Left eval  Hip flexion    Hip extension    Hip abduction    Hip adduction    Hip internal rotation    Hip external rotation    Knee flexion    Knee extension    Ankle dorsiflexion    Ankle plantarflexion    Ankle inversion    Ankle eversion     (Blank rows = not tested)  LOWER EXTREMITY MMT:    MMT Right eval Left eval  Hip flexion    Hip extension    Hip abduction    Hip adduction    Hip internal rotation Mildly impaired Mildly impaired  Hip external rotation Mod impaired Mod impaired  Knee flexion    Knee extension    Ankle dorsiflexion    Ankle plantarflexion    Ankle inversion    Ankle eversion     (Blank rows = WNL)  FUNCTIONAL TESTS:  Timed up and go (TUG): 12.08 Orthostatic BPs Supine- BP 133/83   HR 101 Sitting- Immediate 146/83   100, 3 minutes 130/86  99 Stand- Immediate 122/84 106-no symptoms, 3 minutes  161/98 114  GAIT: Distance walked: 100' Assistive device utilized: None Level of assistance: Complete Independence Comments: Patient demosntrated antalgic gait upon arrival, moving stiffly, but stable. Improved after assessment due to moving/stretching  VESTIBULAR FINDINGS All screening negative-Smooth pursuit, saccades- patient's eyes did fatigue, VOR, functional movements, also screening for BPPV, no symptoms to indicate need for testing.  TODAY'S TREATMENT  Patient education   PATIENT EDUCATION:  Education details: Eval findings, recommendations Person educated: Patient Education method: Explanation Education comprehension:  verbalized understanding   HOME EXERCISE PROGRAM: TBD  ASSESSMENT:  CLINICAL IMPRESSION: Patient is a 88 y.o. who was  seen today for physical therapy evaluation and treatment for vestibular assessment and assessment for LBP. She demosntrated no vestibular signs or symptoms. However, she does have TTP over R lowerback, inside the iliac crest, along with some tightness in the paraspinals and decreased hip ROM, possibly some mild weakness in R hip. She will benefit from PT to address her muscle spasm and tightness, initiate stretching program and strengthening. She was reluctant to participate upon arrival for evaluation, claiming that the Dr misunderstood her when she told him she had had some mild dizziness, but did agree to assessment and treatment plan. She is going out of town for the next month and plans to schedule her visits after returning.   OBJECTIVE IMPAIRMENTS Abnormal gait, decreased coordination, decreased endurance, decreased mobility, difficulty walking, decreased ROM, decreased strength, and pain.   ACTIVITY LIMITATIONS carrying, lifting, reach over head, and locomotion level  PARTICIPATION LIMITATIONS: meal prep, cleaning, laundry, driving, shopping, and community activity  PERSONAL FACTORS Age, Past/current experiences, and Time since onset of injury/illness/exacerbation are also affecting patient's functional outcome.   REHAB POTENTIAL: Good  CLINICAL DECISION MAKING: Stable/uncomplicated  EVALUATION COMPLEXITY: Low   GOALS: Goals reviewed with patient? Yes  SHORT TERM GOALS: Target date: 12/10/2021  I with basic HEP Baseline: Goal status: INITIAL  LONG TERM GOALS: Target date: 01/07/2022  I with final HEP for stretching and strengthening to low back and LE, balance Baseline:  Goal status: INITIAL  2.  Patient will complete TUG in < 10 sec Baseline:  Goal status: INITIAL  3.  Patient will report LBP maintained < 2/10 for at least 3 weeks in a  row. Baseline:  Goal status: INITIAL  4.  Ambulate x at least 500' without AD, MI, no pain, minimal stiffness or deviations from normal gait pattern Baseline:  Goal status: INITIAL  PLAN: PT FREQUENCY: 2x/week  PT DURATION: other: Will be out of town for the next month, then up to 6 weeks.  PLANNED INTERVENTIONS: Therapeutic exercises, Therapeutic activity, Neuromuscular re-education, Balance training, Gait training, Patient/Family education, Joint mobilization, Dry Needling, Electrical stimulation, Cryotherapy, Moist heat, Traction, Ionotophoresis '4mg'$ /ml Dexamethasone, and Manual therapy.  PLAN FOR NEXT SESSION: Initiated HEP for lower trunk and hip stretching/strengthening, STM to lower R back   Marcelina Morel, DPT 10/29/2021, 2:09 PM

## 2021-11-11 ENCOUNTER — Other Ambulatory Visit: Payer: Self-pay | Admitting: Hematology & Oncology

## 2021-11-14 DIAGNOSIS — M79671 Pain in right foot: Secondary | ICD-10-CM | POA: Diagnosis not present

## 2021-11-14 DIAGNOSIS — L603 Nail dystrophy: Secondary | ICD-10-CM | POA: Diagnosis not present

## 2021-11-14 DIAGNOSIS — L84 Corns and callosities: Secondary | ICD-10-CM | POA: Diagnosis not present

## 2021-11-14 DIAGNOSIS — M79672 Pain in left foot: Secondary | ICD-10-CM | POA: Diagnosis not present

## 2021-11-14 DIAGNOSIS — I739 Peripheral vascular disease, unspecified: Secondary | ICD-10-CM | POA: Diagnosis not present

## 2021-11-20 ENCOUNTER — Ambulatory Visit: Payer: Medicare Other | Admitting: Family Medicine

## 2021-12-01 ENCOUNTER — Ambulatory Visit: Payer: Medicare Other | Admitting: Hematology & Oncology

## 2021-12-01 ENCOUNTER — Inpatient Hospital Stay: Payer: Medicare Other

## 2021-12-10 ENCOUNTER — Inpatient Hospital Stay (HOSPITAL_BASED_OUTPATIENT_CLINIC_OR_DEPARTMENT_OTHER): Payer: Medicare Other | Admitting: Hematology & Oncology

## 2021-12-10 ENCOUNTER — Other Ambulatory Visit: Payer: Self-pay

## 2021-12-10 ENCOUNTER — Inpatient Hospital Stay: Payer: Medicare Other | Attending: Hematology & Oncology

## 2021-12-10 ENCOUNTER — Encounter: Payer: Self-pay | Admitting: Hematology & Oncology

## 2021-12-10 VITALS — BP 138/45 | HR 79 | Temp 97.6°F | Resp 18 | Wt 110.0 lb

## 2021-12-10 DIAGNOSIS — C50012 Malignant neoplasm of nipple and areola, left female breast: Secondary | ICD-10-CM | POA: Diagnosis not present

## 2021-12-10 DIAGNOSIS — C50912 Malignant neoplasm of unspecified site of left female breast: Secondary | ICD-10-CM | POA: Insufficient documentation

## 2021-12-10 DIAGNOSIS — Z79810 Long term (current) use of selective estrogen receptor modulators (SERMs): Secondary | ICD-10-CM | POA: Insufficient documentation

## 2021-12-10 DIAGNOSIS — Z9012 Acquired absence of left breast and nipple: Secondary | ICD-10-CM | POA: Diagnosis not present

## 2021-12-10 DIAGNOSIS — Z170 Estrogen receptor positive status [ER+]: Secondary | ICD-10-CM | POA: Diagnosis not present

## 2021-12-10 LAB — CBC WITH DIFFERENTIAL (CANCER CENTER ONLY)
Abs Immature Granulocytes: 0.03 10*3/uL (ref 0.00–0.07)
Basophils Absolute: 0.1 10*3/uL (ref 0.0–0.1)
Basophils Relative: 1 %
Eosinophils Absolute: 0.1 10*3/uL (ref 0.0–0.5)
Eosinophils Relative: 1 %
HCT: 40.9 % (ref 36.0–46.0)
Hemoglobin: 13.6 g/dL (ref 12.0–15.0)
Immature Granulocytes: 0 %
Lymphocytes Relative: 24 %
Lymphs Abs: 2.2 10*3/uL (ref 0.7–4.0)
MCH: 29.9 pg (ref 26.0–34.0)
MCHC: 33.3 g/dL (ref 30.0–36.0)
MCV: 89.9 fL (ref 80.0–100.0)
Monocytes Absolute: 0.7 10*3/uL (ref 0.1–1.0)
Monocytes Relative: 7 %
Neutro Abs: 6.3 10*3/uL (ref 1.7–7.7)
Neutrophils Relative %: 67 %
Platelet Count: 237 10*3/uL (ref 150–400)
RBC: 4.55 MIL/uL (ref 3.87–5.11)
RDW: 12.1 % (ref 11.5–15.5)
WBC Count: 9.3 10*3/uL (ref 4.0–10.5)
nRBC: 0 % (ref 0.0–0.2)

## 2021-12-10 LAB — CMP (CANCER CENTER ONLY)
ALT: 7 U/L (ref 0–44)
AST: 16 U/L (ref 15–41)
Albumin: 4.3 g/dL (ref 3.5–5.0)
Alkaline Phosphatase: 57 U/L (ref 38–126)
Anion gap: 9 (ref 5–15)
BUN: 15 mg/dL (ref 8–23)
CO2: 29 mmol/L (ref 22–32)
Calcium: 9.9 mg/dL (ref 8.9–10.3)
Chloride: 102 mmol/L (ref 98–111)
Creatinine: 0.94 mg/dL (ref 0.44–1.00)
GFR, Estimated: 56 mL/min — ABNORMAL LOW (ref >60)
Glucose, Bld: 121 mg/dL — ABNORMAL HIGH (ref 70–99)
Potassium: 4.1 mmol/L (ref 3.5–5.1)
Sodium: 140 mmol/L (ref 135–145)
Total Bilirubin: 0.4 mg/dL (ref 0.3–1.2)
Total Protein: 6.8 g/dL (ref 6.5–8.1)

## 2021-12-10 LAB — LACTATE DEHYDROGENASE: LDH: 182 U/L (ref 98–192)

## 2021-12-10 MED ORDER — TAMOXIFEN CITRATE 20 MG PO TABS: 20.0000 mg | ORAL_TABLET | Freq: Every day | ORAL | 4 refills | Status: DC

## 2021-12-10 NOTE — Progress Notes (Signed)
Hematology and Oncology Follow Up Visit  KENSLEIGH GATES [id number] [birth year] 88 y.o. 12/10/2021   Principle Diagnosis:  Stage IIB (T3N0M0) invasive ductal carcinoma of the left breast --ER positive/PR positive/HER2 negative  Stage I (T1b N0 M0) infiltrating ductal carcinoma of the right breast in 2011   Current Therapy:   Status post left modified radical mastectomy -09/30/2020 Tamoxifen 20 mg p.o. daily-start on 10/29/2020   Interim History:  Ms. Everding is here today for follow-up.  She comes in with her daughter.  Overall, she is doing quite nicely.  The only problem is that she was at the beach recently.  She got some sand under her pregnancy.  This irritated her mastectomy site.  This was the medial aspect of the mastectomy site.  It was bleeding.  She tended to this.  When I looked at it this morning, there is some irritation but there is no infection.  There is no bleeding.  There is no discharge.  There is no swelling.  Otherwise, she is doing okay.  She is on tamoxifen.  She had no problems with the tamoxifen.  Para she has had no problems with bowels or bladder.  She has had no fever.  She has had no cough or shortness of breath.  She has had no leg swelling.  In 12 days, she will be 88 years old.  I am incredibly impressed by her.  She does not look even close to that age.  Overall, I would say performance status is probably ECOG 2.    Medications:  Allergies as of 12/10/2021       Reactions   Augmentin [amoxicillin-pot Clavulanate] Other (See Comments)   Develops C Diff   Vicodin [hydrocodone-acetaminophen] Other (See Comments)   Did not sleep for 72 hrs   Amoxicillin-pot Clavulanate Other (See Comments)   Naproxen Other (See Comments)   Oxycodone Hcl Other (See Comments)   Tramadol Hcl Other (See Comments)   Tylenol [acetaminophen] Nausea And Vomiting   chills        Medication List        Accurate as of December 10, 2021 11:24 AM. If you have any questions, ask  your nurse or doctor.          ALPRAZolam 0.25 MG tablet Commonly known as: XANAX Take 0.125 mg by mouth daily.   amLODipine 2.5 MG tablet Commonly known as: NORVASC Take 2.5 mg by mouth daily.   aspirin 81 MG tablet Take 81 mg by mouth daily.   CALCIUM PO Take 600 mg by mouth daily.   cholecalciferol 1000 units tablet Commonly known as: VITAMIN D Take 1,000 Units by mouth daily.   CO Q 10 PO Take 1 capsule by mouth daily.   FISH OIL PO Take 1 capsule by mouth daily.   gabapentin 100 MG capsule Commonly known as: Neurontin Take 1 capsule (100 mg total) by mouth 3 (three) times daily as needed.   hydrochlorothiazide 25 MG tablet Commonly known as: HYDRODIURIL Take 25 mg by mouth Daily.   lovastatin 40 MG tablet Commonly known as: MEVACOR Take 40 mg by mouth daily.   MULTIVITAMIN PO Take 1 tablet by mouth daily.   Potassium Chloride ER 20 MEQ Tbcr Take 1 tablet by mouth 2 (two) times daily.   potassium chloride SA 20 MEQ tablet Commonly known as: KLOR-CON M Take 1 tablet (20 mEq total) by mouth daily. What changed: when to take this   tamoxifen 20 MG tablet Commonly known as:  NOLVADEX TAKE 1 TABLET BY MOUTH EVERY DAY        Allergies:  Allergies  Allergen Reactions   Augmentin [Amoxicillin-Pot Clavulanate] Other (See Comments)    Develops C Diff   Vicodin [Hydrocodone-Acetaminophen] Other (See Comments)    Did not sleep for 72 hrs   Amoxicillin-Pot Clavulanate Other (See Comments)   Naproxen Other (See Comments)   Oxycodone Hcl Other (See Comments)   Tramadol Hcl Other (See Comments)   Tylenol [Acetaminophen] Nausea And Vomiting    chills    Past Medical History, Surgical history, Social history, and Family History were reviewed and updated.  Review of Systems: Review of Systems  Constitutional: Negative.   HENT: Negative.    Eyes: Negative.   Respiratory: Negative.    Cardiovascular: Negative.   Gastrointestinal: Negative.    Genitourinary: Negative.   Musculoskeletal: Negative.   Skin: Negative.   Neurological: Negative.   Endo/Heme/Allergies: Negative.   Psychiatric/Behavioral: Negative.       Physical Exam:  weight is 110 lb (49.9 kg). Her oral temperature is 97.6 F (36.4 C). Her blood pressure is 138/45 (abnormal) and her pulse is 79. Her respiration is 18 and oxygen saturation is 100%.   Wt Readings from Last 3 Encounters:  12/10/21 110 lb (49.9 kg)  09/30/21 114 lb 12.8 oz (52.1 kg)  08/01/21 112 lb (50.8 kg)    Physical Exam Vitals reviewed.  Constitutional:      Comments: Her chest wall exam shows the left chest wall with the healing modified radical mastectomy scar.  She has no erythema or warmth.  She has no nodularity.  There is no left axillary adenopathy.  Right breast shows the lumpectomy which is well-healed at about the 3 o'clock position.  There is no right axillary adenopathy.  HENT:     Head: Normocephalic and atraumatic.  Eyes:     Pupils: Pupils are equal, round, and reactive to light.  Cardiovascular:     Rate and Rhythm: Normal rate and regular rhythm.     Heart sounds: Normal heart sounds.  Pulmonary:     Effort: Pulmonary effort is normal.     Breath sounds: Normal breath sounds.  Abdominal:     General: Bowel sounds are normal.     Palpations: Abdomen is soft.  Musculoskeletal:        General: No tenderness or deformity. Normal range of motion.     Cervical back: Normal range of motion.  Lymphadenopathy:     Cervical: No cervical adenopathy.  Skin:    General: Skin is warm and dry.     Findings: No erythema or rash.  Neurological:     Mental Status: She is alert and oriented to person, place, and time.  Psychiatric:        Behavior: Behavior normal.        Thought Content: Thought content normal.        Judgment: Judgment normal.      Lab Results  Component Value Date   WBC 9.3 12/10/2021   HGB 13.6 12/10/2021   HCT 40.9 12/10/2021   MCV 89.9  12/10/2021   PLT 237 12/10/2021   No results found for: "FERRITIN", "IRON", "TIBC", "UIBC", "IRONPCTSAT" Lab Results  Component Value Date   RBC 4.55 12/10/2021   No results found for: "KPAFRELGTCHN", "LAMBDASER", "KAPLAMBRATIO" No results found for: "IGGSERUM", "IGA", "IGMSERUM" No results found for: "TOTALPROTELP", "ALBUMINELP", "A1GS", "A2GS", "BETS", "BETA2SER", "GAMS", "MSPIKE", "SPEI"   Chemistry      Component  Value Date/Time   NA 140 12/10/2021 1030   NA 146 (H) 02/15/2017 1008   NA 142 08/07/2016 0858   K 4.1 12/10/2021 1030   K 3.2 (L) 02/15/2017 1008   K 3.3 (L) 08/07/2016 0858   CL 102 12/10/2021 1030   CL 105 02/15/2017 1008   CO2 29 12/10/2021 1030   CO2 31 02/15/2017 1008   CO2 29 08/07/2016 0858   BUN 15 12/10/2021 1030   BUN 15 02/15/2017 1008   BUN 23.5 08/07/2016 0858   CREATININE 0.94 12/10/2021 1030   CREATININE 1.0 02/15/2017 1008   CREATININE 1.0 08/07/2016 0858      Component Value Date/Time   CALCIUM 9.9 12/10/2021 1030   CALCIUM 9.8 02/15/2017 1008   CALCIUM 10.0 08/07/2016 0858   ALKPHOS 57 12/10/2021 1030   ALKPHOS 80 02/15/2017 1008   ALKPHOS 84 08/07/2016 0858   AST 16 12/10/2021 1030   AST 24 08/07/2016 0858   ALT 7 12/10/2021 1030   ALT 20 02/15/2017 1008   ALT 14 08/07/2016 0858   BILITOT 0.4 12/10/2021 1030   BILITOT 0.50 08/07/2016 0858       Impression and Plan: Ms. Somes is a very pleasant 88 yo caucasian female with history of stage I ductal carcinoma of the right breast diagnosed in September 2011.   She had lumpectomy and radiation therapy.  We tried her on an aromatase inhibitor and she tolerated these poorly.  She then developed a new breast cancer on the left side.  She had a modified left radical mastectomy.  She had a large tumor.  Thankfully it had good prognostic markers.  She had a mastectomy on 09/30/2020.  We will continue her on tamoxifen.  She seems to be tolerating the tamoxifen pretty well.  For right now,  I think we can plan to get her back in 6 months now.  I know that she will have a wonderful birthday.  Her family does a really great job helping to take care of her.     [name], MD 8/2/202311:24 AM

## 2021-12-11 LAB — CANCER ANTIGEN 27.29: CA 27.29: 11 U/mL (ref 0.0–38.6)

## 2021-12-17 DIAGNOSIS — R7301 Impaired fasting glucose: Secondary | ICD-10-CM | POA: Diagnosis not present

## 2021-12-17 DIAGNOSIS — Z79899 Other long term (current) drug therapy: Secondary | ICD-10-CM | POA: Diagnosis not present

## 2021-12-22 DIAGNOSIS — E7800 Pure hypercholesterolemia, unspecified: Secondary | ICD-10-CM | POA: Diagnosis not present

## 2021-12-22 DIAGNOSIS — I10 Essential (primary) hypertension: Secondary | ICD-10-CM | POA: Diagnosis not present

## 2021-12-22 DIAGNOSIS — Z0001 Encounter for general adult medical examination with abnormal findings: Secondary | ICD-10-CM | POA: Diagnosis not present

## 2021-12-22 DIAGNOSIS — M810 Age-related osteoporosis without current pathological fracture: Secondary | ICD-10-CM | POA: Diagnosis not present

## 2021-12-22 DIAGNOSIS — Z79899 Other long term (current) drug therapy: Secondary | ICD-10-CM | POA: Diagnosis not present

## 2021-12-22 DIAGNOSIS — F419 Anxiety disorder, unspecified: Secondary | ICD-10-CM | POA: Diagnosis not present

## 2022-02-11 DIAGNOSIS — Z23 Encounter for immunization: Secondary | ICD-10-CM | POA: Diagnosis not present

## 2022-02-13 DIAGNOSIS — L84 Corns and callosities: Secondary | ICD-10-CM | POA: Diagnosis not present

## 2022-02-13 DIAGNOSIS — L603 Nail dystrophy: Secondary | ICD-10-CM | POA: Diagnosis not present

## 2022-02-13 DIAGNOSIS — I739 Peripheral vascular disease, unspecified: Secondary | ICD-10-CM | POA: Diagnosis not present

## 2022-05-15 DIAGNOSIS — L603 Nail dystrophy: Secondary | ICD-10-CM | POA: Diagnosis not present

## 2022-05-15 DIAGNOSIS — L84 Corns and callosities: Secondary | ICD-10-CM | POA: Diagnosis not present

## 2022-05-15 DIAGNOSIS — I739 Peripheral vascular disease, unspecified: Secondary | ICD-10-CM | POA: Diagnosis not present

## 2022-06-12 ENCOUNTER — Inpatient Hospital Stay (HOSPITAL_BASED_OUTPATIENT_CLINIC_OR_DEPARTMENT_OTHER): Payer: Medicare Other | Admitting: Hematology & Oncology

## 2022-06-12 ENCOUNTER — Encounter: Payer: Self-pay | Admitting: Hematology & Oncology

## 2022-06-12 ENCOUNTER — Inpatient Hospital Stay: Payer: Medicare Other | Attending: Hematology & Oncology

## 2022-06-12 VITALS — BP 144/51 | HR 91 | Temp 98.4°F | Resp 20 | Ht 60.0 in | Wt 111.8 lb

## 2022-06-12 DIAGNOSIS — Z79810 Long term (current) use of selective estrogen receptor modulators (SERMs): Secondary | ICD-10-CM | POA: Insufficient documentation

## 2022-06-12 DIAGNOSIS — C50012 Malignant neoplasm of nipple and areola, left female breast: Secondary | ICD-10-CM

## 2022-06-12 DIAGNOSIS — C50812 Malignant neoplasm of overlapping sites of left female breast: Secondary | ICD-10-CM | POA: Diagnosis not present

## 2022-06-12 DIAGNOSIS — Z170 Estrogen receptor positive status [ER+]: Secondary | ICD-10-CM | POA: Insufficient documentation

## 2022-06-12 LAB — CBC WITH DIFFERENTIAL (CANCER CENTER ONLY)
Abs Immature Granulocytes: 0.02 10*3/uL (ref 0.00–0.07)
Basophils Absolute: 0.1 10*3/uL (ref 0.0–0.1)
Basophils Relative: 1 %
Eosinophils Absolute: 0.1 10*3/uL (ref 0.0–0.5)
Eosinophils Relative: 1 %
HCT: 44.5 % (ref 36.0–46.0)
Hemoglobin: 14.6 g/dL (ref 12.0–15.0)
Immature Granulocytes: 0 %
Lymphocytes Relative: 28 %
Lymphs Abs: 2.8 10*3/uL (ref 0.7–4.0)
MCH: 29.7 pg (ref 26.0–34.0)
MCHC: 32.8 g/dL (ref 30.0–36.0)
MCV: 90.4 fL (ref 80.0–100.0)
Monocytes Absolute: 0.9 10*3/uL (ref 0.1–1.0)
Monocytes Relative: 10 %
Neutro Abs: 5.9 10*3/uL (ref 1.7–7.7)
Neutrophils Relative %: 60 %
Platelet Count: 242 10*3/uL (ref 150–400)
RBC: 4.92 MIL/uL (ref 3.87–5.11)
RDW: 12.2 % (ref 11.5–15.5)
WBC Count: 9.9 10*3/uL (ref 4.0–10.5)
nRBC: 0 % (ref 0.0–0.2)

## 2022-06-12 LAB — CMP (CANCER CENTER ONLY)
ALT: 7 U/L (ref 0–44)
AST: 16 U/L (ref 15–41)
Albumin: 4.4 g/dL (ref 3.5–5.0)
Alkaline Phosphatase: 54 U/L (ref 38–126)
Anion gap: 11 (ref 5–15)
BUN: 19 mg/dL (ref 8–23)
CO2: 27 mmol/L (ref 22–32)
Calcium: 9.7 mg/dL (ref 8.9–10.3)
Chloride: 102 mmol/L (ref 98–111)
Creatinine: 0.94 mg/dL (ref 0.44–1.00)
GFR, Estimated: 56 mL/min — ABNORMAL LOW (ref >60)
Glucose, Bld: 137 mg/dL — ABNORMAL HIGH (ref 70–99)
Potassium: 4 mmol/L (ref 3.5–5.1)
Sodium: 140 mmol/L (ref 135–145)
Total Bilirubin: 0.4 mg/dL (ref 0.3–1.2)
Total Protein: 7.4 g/dL (ref 6.5–8.1)

## 2022-06-12 NOTE — Progress Notes (Signed)
Hematology and Oncology Follow Up Visit  Nancy Bell [id number] 1924-07-15 89 y.o. 06/12/2022   Principle Diagnosis:  Stage IIB (T3N0M0) invasive ductal carcinoma of the left breast --ER positive/PR positive/HER2 negative  Stage I (T1b N0 M0) infiltrating ductal carcinoma of the right breast in 2011   Current Therapy:   Status post left modified radical mastectomy -09/30/2020 Tamoxifen 20 mg p.o. daily-start on 10/29/2020   Interim History:  Nancy Bell is here today for follow-up.  She comes in with her daughter.  We last saw her probably about 6 months ago.  She seems to be managing pretty well.  She had a nice Holiday season with her family.  She really has had no specific complaints.  She has had no cough or shortness of breath.  She has had no nausea or vomiting.  Thankfully, she has avoided COVID and Influenza.  She has had no headache.  There is been no rashes.  She has had no bleeding.  She is doing well on the tamoxifen.  She has had no visual difficulties.  Overall, I would say that her performance status is ECOG 2.    Medications:  Allergies as of 06/12/2022       Reactions   Augmentin [amoxicillin-pot Clavulanate] Other (See Comments)   Develops C Diff   Vicodin [hydrocodone-acetaminophen] Other (See Comments)   Did not sleep for 72 hrs   Amoxicillin-pot Clavulanate Other (See Comments)   Naproxen Other (See Comments)   Oxycodone Hcl Other (See Comments)   Tramadol Hcl Other (See Comments)   Tylenol [acetaminophen] Nausea And Vomiting   chills        Medication List        Accurate as of June 12, 2022 11:18 AM. If you have any questions, ask your nurse or doctor.          STOP taking these medications    aspirin 81 MG tablet Stopped by: [name], MD   CALCIUM PO Stopped by: [name], MD       TAKE these medications    ALPRAZolam 0.25 MG tablet Commonly known as: XANAX Take 0.125 mg by mouth daily.   amLODipine 2.5 MG  tablet Commonly known as: NORVASC Take 2.5 mg by mouth daily.   cholecalciferol 1000 units tablet Commonly known as: VITAMIN D Take 1,000 Units by mouth daily.   CO Q 10 PO Take 1 capsule by mouth daily.   FISH OIL PO Take 1 capsule by mouth daily.   gabapentin 100 MG capsule Commonly known as: Neurontin Take 1 capsule (100 mg total) by mouth 3 (three) times daily as needed.   hydrochlorothiazide 25 MG tablet Commonly known as: HYDRODIURIL Take 25 mg by mouth Daily.   KLOR-CON PO Take 20 mEq by mouth daily.   lovastatin 40 MG tablet Commonly known as: MEVACOR Take 40 mg by mouth daily.   MULTIVITAMIN PO Take 1 tablet by mouth daily.   tamoxifen 20 MG tablet Commonly known as: NOLVADEX Take 1 tablet (20 mg total) by mouth daily.        Allergies:  Allergies  Allergen Reactions   Augmentin [Amoxicillin-Pot Clavulanate] Other (See Comments)    Develops C Diff   Vicodin [Hydrocodone-Acetaminophen] Other (See Comments)    Did not sleep for 72 hrs   Amoxicillin-Pot Clavulanate Other (See Comments)   Naproxen Other (See Comments)   Oxycodone Hcl Other (See Comments)   Tramadol Hcl Other (See Comments)   Tylenol [Acetaminophen] Nausea And  Vomiting    chills    Past Medical History, Surgical history, Social history, and Family History were reviewed and updated.  Review of Systems: Review of Systems  Constitutional: Negative.   HENT: Negative.    Eyes: Negative.   Respiratory: Negative.    Cardiovascular: Negative.   Gastrointestinal: Negative.   Genitourinary: Negative.   Musculoskeletal: Negative.   Skin: Negative.   Neurological: Negative.   Endo/Heme/Allergies: Negative.   Psychiatric/Behavioral: Negative.       Physical Exam:  height is 5' (1.524 m) and weight is 111 lb 12.8 oz (50.7 kg). Her oral temperature is 98.4 F (36.9 C). Her blood pressure is 144/51 (abnormal) and her pulse is 91. Her respiration is 20 and oxygen saturation is 100%.    Wt Readings from Last 3 Encounters:  06/12/22 111 lb 12.8 oz (50.7 kg)  12/10/21 110 lb (49.9 kg)  09/30/21 114 lb 12.8 oz (52.1 kg)    Physical Exam Vitals reviewed.  Constitutional:      Comments: Her chest wall exam shows the left chest wall with the healing modified radical mastectomy scar.  She has no erythema or warmth.  She has no nodularity.  There is no left axillary adenopathy.  Right breast shows the lumpectomy which is well-healed at about the 3 o'clock position.  There is no right axillary adenopathy.  HENT:     Head: Normocephalic and atraumatic.  Eyes:     Pupils: Pupils are equal, round, and reactive to light.  Cardiovascular:     Rate and Rhythm: Normal rate and regular rhythm.     Heart sounds: Normal heart sounds.  Pulmonary:     Effort: Pulmonary effort is normal.     Breath sounds: Normal breath sounds.  Abdominal:     General: Bowel sounds are normal.     Palpations: Abdomen is soft.  Musculoskeletal:        General: No tenderness or deformity. Normal range of motion.     Cervical back: Normal range of motion.  Lymphadenopathy:     Cervical: No cervical adenopathy.  Skin:    General: Skin is warm and dry.     Findings: No erythema or rash.  Neurological:     Mental Status: She is alert and oriented to person, place, and time.  Psychiatric:        Behavior: Behavior normal.        Thought Content: Thought content normal.        Judgment: Judgment normal.       Lab Results  Component Value Date   WBC 9.9 06/12/2022   HGB 14.6 06/12/2022   HCT 44.5 06/12/2022   MCV 90.4 06/12/2022   PLT 242 06/12/2022   No results found for: "FERRITIN", "IRON", "TIBC", "UIBC", "IRONPCTSAT" Lab Results  Component Value Date   RBC 4.92 06/12/2022   No results found for: "KPAFRELGTCHN", "LAMBDASER", "KAPLAMBRATIO" No results found for: "IGGSERUM", "IGA", "IGMSERUM" No results found for: "TOTALPROTELP", "ALBUMINELP", "A1GS", "A2GS", "BETS", "BETA2SER",  "GAMS", "MSPIKE", "SPEI"   Chemistry      Component Value Date/Time   NA 140 12/10/2021 1030   NA 146 (H) 02/15/2017 1008   NA 142 08/07/2016 0858   K 4.1 12/10/2021 1030   K 3.2 (L) 02/15/2017 1008   K 3.3 (L) 08/07/2016 0858   CL 102 12/10/2021 1030   CL 105 02/15/2017 1008   CO2 29 12/10/2021 1030   CO2 31 02/15/2017 1008   CO2 29 08/07/2016 0858  BUN 15 12/10/2021 1030   BUN 15 02/15/2017 1008   BUN 23.5 08/07/2016 0858   CREATININE 0.94 12/10/2021 1030   CREATININE 1.0 02/15/2017 1008   CREATININE 1.0 08/07/2016 0858      Component Value Date/Time   CALCIUM 9.9 12/10/2021 1030   CALCIUM 9.8 02/15/2017 1008   CALCIUM 10.0 08/07/2016 0858   ALKPHOS 57 12/10/2021 1030   ALKPHOS 80 02/15/2017 1008   ALKPHOS 84 08/07/2016 0858   AST 16 12/10/2021 1030   AST 24 08/07/2016 0858   ALT 7 12/10/2021 1030   ALT 20 02/15/2017 1008   ALT 14 08/07/2016 0858   BILITOT 0.4 12/10/2021 1030   BILITOT 0.50 08/07/2016 0858       Impression and Plan: Ms. Stoneberg is a very pleasant 89 yo caucasian female with history of stage I ductal carcinoma of the right breast diagnosed in September 2011.   She had lumpectomy and radiation therapy.  We tried her on an aromatase inhibitor and she tolerated these poorly.  She then developed a new breast cancer on the left side.  She had a modified left radical mastectomy.  She had a large tumor.  Thankfully it had good prognostic markers.  She had a mastectomy on 09/30/2020.  We will continue her on tamoxifen.  She seems to be tolerating the tamoxifen pretty well.  For right now, I think we can plan to get her back in 6 months now.  I know that she will have a wonderful birthday.  Her family does a really great job helping to take care of her.     [name], MD 2/2/202411:18 AM

## 2022-09-07 DIAGNOSIS — L84 Corns and callosities: Secondary | ICD-10-CM | POA: Diagnosis not present

## 2022-09-07 DIAGNOSIS — I739 Peripheral vascular disease, unspecified: Secondary | ICD-10-CM | POA: Diagnosis not present

## 2022-09-07 DIAGNOSIS — L603 Nail dystrophy: Secondary | ICD-10-CM | POA: Diagnosis not present

## 2022-10-01 ENCOUNTER — Emergency Department (HOSPITAL_COMMUNITY): Payer: Medicare Other

## 2022-10-01 ENCOUNTER — Encounter (HOSPITAL_COMMUNITY): Payer: Self-pay | Admitting: Internal Medicine

## 2022-10-01 ENCOUNTER — Observation Stay (HOSPITAL_COMMUNITY)
Admission: EM | Admit: 2022-10-01 | Discharge: 2022-10-02 | Disposition: A | Discharge status: Home / Self Care | Payer: Medicare Other | Attending: Internal Medicine | Admitting: Internal Medicine

## 2022-10-01 ENCOUNTER — Other Ambulatory Visit: Payer: Self-pay

## 2022-10-01 DIAGNOSIS — I10 Essential (primary) hypertension: Secondary | ICD-10-CM | POA: Diagnosis present

## 2022-10-01 DIAGNOSIS — R2681 Unsteadiness on feet: Secondary | ICD-10-CM | POA: Insufficient documentation

## 2022-10-01 DIAGNOSIS — E7800 Pure hypercholesterolemia, unspecified: Secondary | ICD-10-CM | POA: Diagnosis present

## 2022-10-01 DIAGNOSIS — Z79899 Other long term (current) drug therapy: Secondary | ICD-10-CM | POA: Insufficient documentation

## 2022-10-01 DIAGNOSIS — F419 Anxiety disorder, unspecified: Secondary | ICD-10-CM | POA: Diagnosis not present

## 2022-10-01 DIAGNOSIS — E860 Dehydration: Secondary | ICD-10-CM | POA: Diagnosis not present

## 2022-10-01 DIAGNOSIS — K5730 Diverticulosis of large intestine without perforation or abscess without bleeding: Secondary | ICD-10-CM | POA: Diagnosis not present

## 2022-10-01 DIAGNOSIS — E876 Hypokalemia: Secondary | ICD-10-CM | POA: Insufficient documentation

## 2022-10-01 DIAGNOSIS — K529 Noninfective gastroenteritis and colitis, unspecified: Secondary | ICD-10-CM | POA: Insufficient documentation

## 2022-10-01 DIAGNOSIS — R112 Nausea with vomiting, unspecified: Principal | ICD-10-CM | POA: Diagnosis present

## 2022-10-01 DIAGNOSIS — R0689 Other abnormalities of breathing: Secondary | ICD-10-CM | POA: Diagnosis not present

## 2022-10-01 DIAGNOSIS — Z853 Personal history of malignant neoplasm of breast: Secondary | ICD-10-CM

## 2022-10-01 DIAGNOSIS — R197 Diarrhea, unspecified: Secondary | ICD-10-CM | POA: Diagnosis not present

## 2022-10-01 DIAGNOSIS — M6281 Muscle weakness (generalized): Secondary | ICD-10-CM | POA: Diagnosis not present

## 2022-10-01 DIAGNOSIS — K7689 Other specified diseases of liver: Secondary | ICD-10-CM | POA: Diagnosis not present

## 2022-10-01 DIAGNOSIS — R1111 Vomiting without nausea: Secondary | ICD-10-CM | POA: Diagnosis not present

## 2022-10-01 DIAGNOSIS — R110 Nausea: Secondary | ICD-10-CM | POA: Diagnosis not present

## 2022-10-01 LAB — CBC WITH DIFFERENTIAL/PLATELET
Abs Immature Granulocytes: 0.04 10*3/uL (ref 0.00–0.07)
Basophils Absolute: 0 10*3/uL (ref 0.0–0.1)
Basophils Relative: 0 %
Eosinophils Absolute: 0 10*3/uL (ref 0.0–0.5)
Eosinophils Relative: 0 %
HCT: 39.5 % (ref 36.0–46.0)
Hemoglobin: 13.1 g/dL (ref 12.0–15.0)
Immature Granulocytes: 0 %
Lymphocytes Relative: 6 %
Lymphs Abs: 0.8 10*3/uL (ref 0.7–4.0)
MCH: 29.1 pg (ref 26.0–34.0)
MCHC: 33.2 g/dL (ref 30.0–36.0)
MCV: 87.8 fL (ref 80.0–100.0)
Monocytes Absolute: 0.7 10*3/uL (ref 0.1–1.0)
Monocytes Relative: 5 %
Neutro Abs: 11.9 10*3/uL — ABNORMAL HIGH (ref 1.7–7.7)
Neutrophils Relative %: 89 %
Platelets: 200 10*3/uL (ref 150–400)
RBC: 4.5 MIL/uL (ref 3.87–5.11)
RDW: 12.1 % (ref 11.5–15.5)
WBC: 13.4 10*3/uL — ABNORMAL HIGH (ref 4.0–10.5)
nRBC: 0 % (ref 0.0–0.2)

## 2022-10-01 LAB — COMPREHENSIVE METABOLIC PANEL
ALT: 12 U/L (ref 0–44)
AST: 26 U/L (ref 15–41)
Albumin: 3.5 g/dL (ref 3.5–5.0)
Alkaline Phosphatase: 50 U/L (ref 38–126)
Anion gap: 16 — ABNORMAL HIGH (ref 5–15)
BUN: 17 mg/dL (ref 8–23)
CO2: 19 mmol/L — ABNORMAL LOW (ref 22–32)
Calcium: 8.7 mg/dL — ABNORMAL LOW (ref 8.9–10.3)
Chloride: 100 mmol/L (ref 98–111)
Creatinine, Ser: 1.11 mg/dL — ABNORMAL HIGH (ref 0.44–1.00)
GFR, Estimated: 46 mL/min — ABNORMAL LOW (ref >60)
Glucose, Bld: 150 mg/dL — ABNORMAL HIGH (ref 70–99)
Potassium: 2.8 mmol/L — ABNORMAL LOW (ref 3.5–5.1)
Sodium: 135 mmol/L (ref 135–145)
Total Bilirubin: 0.9 mg/dL (ref 0.3–1.2)
Total Protein: 6.4 g/dL — ABNORMAL LOW (ref 6.5–8.1)

## 2022-10-01 LAB — TROPONIN I (HIGH SENSITIVITY)
Troponin I (High Sensitivity): 8 ng/L (ref <18)
Troponin I (High Sensitivity): 13 ng/L (ref <18)

## 2022-10-01 LAB — MAGNESIUM: Magnesium: 1.8 mg/dL (ref 1.7–2.4)

## 2022-10-01 LAB — LACTIC ACID, PLASMA
Lactic Acid, Venous: 2.3 mmol/L (ref 0.5–1.9)
Lactic Acid, Venous: 1.6 mmol/L (ref 0.5–1.9)

## 2022-10-01 LAB — LIPASE, BLOOD: Lipase: 27 U/L (ref 11–51)

## 2022-10-01 MED ORDER — POTASSIUM CHLORIDE CRYS ER 20 MEQ PO TBCR
40.0000 meq | EXTENDED_RELEASE_TABLET | Freq: Once | ORAL | Status: AC
  Administered 2022-10-01: 40 meq via ORAL
  Filled 2022-10-01: qty 2

## 2022-10-01 MED ORDER — IOHEXOL 350 MG/ML SOLN
75.0000 mL | Freq: Once | INTRAVENOUS | Status: AC | PRN
  Administered 2022-10-01: 75 mL via INTRAVENOUS

## 2022-10-01 MED ORDER — ALPRAZOLAM 0.25 MG PO TABS: 0.1250 mg | ORAL_TABLET | Freq: Every day | ORAL | Status: DC | PRN

## 2022-10-01 MED ORDER — SODIUM CHLORIDE 0.9% FLUSH
3.0000 mL | Freq: Two times a day (BID) | INTRAVENOUS | Status: DC
  Administered 2022-10-01 – 2022-10-02 (×3): 3 mL via INTRAVENOUS

## 2022-10-01 MED ORDER — PRAVASTATIN SODIUM 40 MG PO TABS: 40.0000 mg | ORAL_TABLET | Freq: Every day | ORAL | Status: DC

## 2022-10-01 MED ORDER — ENOXAPARIN SODIUM 30 MG/0.3ML IJ SOSY
30.0000 mg | PREFILLED_SYRINGE | INTRAMUSCULAR | Status: DC
  Administered 2022-10-01 – 2022-10-02 (×2): 30 mg via SUBCUTANEOUS
  Filled 2022-10-01 (×2): qty 0.3

## 2022-10-01 MED ORDER — LACTATED RINGERS IV BOLUS
1000.0000 mL | Freq: Once | INTRAVENOUS | Status: AC
  Administered 2022-10-01: 1000 mL via INTRAVENOUS

## 2022-10-01 MED ORDER — POTASSIUM CHLORIDE 10 MEQ/100ML IV SOLN
10.0000 meq | INTRAVENOUS | Status: AC
  Administered 2022-10-01 (×2): 10 meq via INTRAVENOUS
  Filled 2022-10-01 (×2): qty 100

## 2022-10-01 MED ORDER — SODIUM CHLORIDE 0.9 % IV SOLN: INTRAVENOUS | Status: AC

## 2022-10-01 MED ORDER — TAMOXIFEN CITRATE 10 MG PO TABS
20.0000 mg | ORAL_TABLET | Freq: Every day | ORAL | Status: DC
  Administered 2022-10-02: 20 mg via ORAL
  Filled 2022-10-01: qty 2

## 2022-10-01 MED ORDER — ONDANSETRON HCL 4 MG/2ML IJ SOLN
4.0000 mg | Freq: Once | INTRAMUSCULAR | Status: AC
  Administered 2022-10-01: 4 mg via INTRAVENOUS
  Filled 2022-10-01: qty 2

## 2022-10-01 NOTE — ED Provider Notes (Signed)
[facility] [facility] AT [facility] Provider Note   CSN: [id number] Arrival date & time: 10/01/22  1112     History  Chief Complaint  Patient presents with   Nausea   Diarrhea   Emesis    Nancy Bell is a 89 y.o. female.  Patient with a history of breast cancer in remission, hypertension, atrial hernia presenting with a 1 week history of nausea, vomiting and diarrhea.  States she has had multiple episodes of nausea and vomiting daily for the past 1 week.  Cannot quantify how many episodes.  Denies any black or bloody stools or emesis.  Having lightheadedness and generalized weakness.  Denies any significant abdominal pain though she does have some cramping earlier in the week.  No known fever.  No travel or sick contacts.  No recent antibiotic use.  No fevers.  No suspicious food intake. No previous abdominal surgeries or history of abdominal problems.  States she has been trying to eat liquids at home but having difficulty encouraging herself to do this. No chest pain or shortness of breath.  Denies any difficulty speaking or difficulty swallowing.  No significant abdominal pain currently.  No pain with urination or blood in the urine.  The history is provided by the patient.  Diarrhea Associated symptoms: abdominal pain and vomiting   Associated symptoms: no arthralgias, no fever and no myalgias   Emesis Associated symptoms: abdominal pain and diarrhea   Associated symptoms: no arthralgias, no cough, no fever and no myalgias        Home Medications Prior to Admission medications   Medication Sig Start Date End Date Taking? Authorizing Provider  ALPRAZolam (XANAX) 0.25 MG tablet Take 0.125 mg by mouth daily. 01/23/15   [provider]  amLODipine (NORVASC) 2.5 MG tablet Take 2.5 mg by mouth daily.    [provider]  cholecalciferol (VITAMIN D) 1000 units tablet Take 1,000 Units by mouth daily.    [provider]  Coenzyme  Q10 (CO Q 10 PO) Take 1 capsule by mouth daily.    [provider]  gabapentin (NEURONTIN) 100 MG capsule Take 1 capsule (100 mg total) by mouth 3 (three) times daily as needed. 10/01/20 10/01/21  Chevis Pretty III, MD  hydrochlorothiazide (HYDRODIURIL) 25 MG tablet Take 25 mg by mouth Daily. 05/23/11   [provider]  lovastatin (MEVACOR) 40 MG tablet Take 40 mg by mouth daily. 01/31/15   [provider]  Multiple Vitamin (MULTIVITAMIN PO) Take 1 tablet by mouth daily.    [provider]  Omega-3 Fatty Acids (FISH OIL PO) Take 1 capsule by mouth daily.    [provider]  Potassium Chloride (KLOR-CON PO) Take 20 mEq by mouth daily.    [provider]  tamoxifen (NOLVADEX) 20 MG tablet Take 1 tablet (20 mg total) by mouth daily. 12/10/21   Josph Macho, MD      Allergies    Augmentin [amoxicillin-pot clavulanate], Vicodin [hydrocodone-acetaminophen], Amoxicillin-pot clavulanate, Naproxen, Oxycodone hcl, Tramadol hcl, and Tylenol [acetaminophen]    Review of Systems   Review of Systems  Constitutional:  Positive for activity change, appetite change and fatigue. Negative for fever.  HENT:  Negative for congestion and rhinorrhea.   Respiratory:  Negative for cough, chest tightness and shortness of breath.   Cardiovascular:  Negative for chest pain.  Gastrointestinal:  Positive for abdominal pain, diarrhea, nausea and vomiting.  Genitourinary:  Negative for dysuria.  Musculoskeletal:  Negative for arthralgias  and myalgias.  Skin:  Negative for wound.  Neurological:  Positive for dizziness, weakness and light-headedness.   all other systems are negative except as noted in the HPI and PMH.    Physical Exam Updated Vital Signs BP 126/73 (BP Location: Right Arm)   Pulse 95   Temp 98.6 F (37 C) (Oral)   Resp 18   Ht 5' (1.524 m)   Wt 50.7 kg   SpO2 100%   BMI 21.83 kg/m  Physical Exam Vitals and nursing note reviewed.  Constitutional:       General: She is not in acute distress.    Appearance: She is well-developed.  HENT:     Head: Normocephalic and atraumatic.     Mouth/Throat:     Mouth: Mucous membranes are dry.     Pharynx: No oropharyngeal exudate.  Eyes:     Conjunctiva/sclera: Conjunctivae normal.     Pupils: Pupils are equal, round, and reactive to light.  Neck:     Comments: No meningismus. Cardiovascular:     Rate and Rhythm: Normal rate and regular rhythm.     Heart sounds: Normal heart sounds. No murmur heard. Pulmonary:     Effort: Pulmonary effort is normal. No respiratory distress.     Breath sounds: Normal breath sounds.  Abdominal:     Palpations: Abdomen is soft.     Tenderness: There is no abdominal tenderness. There is no guarding or rebound.  Musculoskeletal:        General: No tenderness. Normal range of motion.     Cervical back: Normal range of motion and neck supple.  Skin:    General: Skin is warm.  Neurological:     Mental Status: She is alert and oriented to person, place, and time.     Cranial Nerves: No cranial nerve deficit.     Motor: No abnormal muscle tone.     Coordination: Coordination normal.     Comments:  5/5 strength throughout. CN 2-12 intact.Equal grip strength.   Psychiatric:        Behavior: Behavior normal.     ED Results / Procedures / Treatments   Labs (all labs ordered are listed, but only abnormal results are displayed) Labs Reviewed  CBC WITH DIFFERENTIAL/PLATELET - Abnormal; Notable for the following components:      Result Value   WBC 13.4 (*)    Neutro Abs 11.9 (*)    All other components within normal limits  COMPREHENSIVE METABOLIC PANEL - Abnormal; Notable for the following components:   Potassium 2.8 (*)    CO2 19 (*)    Glucose, Bld 150 (*)    Creatinine, Ser 1.11 (*)    Calcium 8.7 (*)    Total Protein 6.4 (*)    GFR, Estimated 46 (*)    Anion gap 16 (*)    All other components within normal limits  LACTIC ACID, PLASMA - Abnormal;  Notable for the following components:   Lactic Acid, Venous 2.3 (*)    All other components within normal limits  C DIFFICILE QUICK SCREEN W PCR REFLEX    GASTROINTESTINAL PANEL BY PCR, STOOL (REPLACES STOOL CULTURE)  LIPASE, BLOOD  LACTIC ACID, PLASMA  MAGNESIUM  TROPONIN I (HIGH SENSITIVITY)  TROPONIN I (HIGH SENSITIVITY)    EKG EKG Interpretation  Date/Time:  Thursday Oct 01 2022 11:42:31 EDT Ventricular Rate:  96 PR Interval:  169 QRS Duration: 93 QT Interval:  399 QTC Calculation: 505 R Axis:   -22 Text Interpretation:  Sinus rhythm Atrial premature complexes Borderline left axis deviation Low voltage, precordial leads Prolonged QT interval Nonspecific ST abnormality Confirmed by Glynn Octave [phone]) on 10/01/2022 11:46:51 AM  Radiology CT ABDOMEN PELVIS W CONTRAST  Result Date: 10/01/2022 CLINICAL DATA:  Abdominal pain, acute, nonlocalized. Vomiting and diarrhea. Three days duration. EXAM: CT ABDOMEN AND PELVIS WITH CONTRAST TECHNIQUE: Multidetector CT imaging of the abdomen and pelvis was performed using the standard protocol following bolus administration of intravenous contrast. RADIATION DOSE REDUCTION: This exam was performed according to the departmental dose-optimization program which includes automated exposure control, adjustment of the mA and/or kV according to patient size and/or use of iterative reconstruction technique. CONTRAST:  75mL OMNIPAQUE IOHEXOL 350 MG/ML SOLN COMPARISON:  None Available. FINDINGS: Lower chest: Lung bases are clear.  Moderate size hiatal hernia. Hepatobiliary: Profound diffuse fatty change of the liver. 1 cm cyst in the right lobe. No calcified gallstones. Pancreas: Normal.  Incidental duodenal diverticulum. Spleen: Normal Adrenals/Urinary Tract: Adrenal glands are normal. Kidneys are normal. Bladder is normal. Stomach/Bowel: Hiatal hernia as noted above. Small bowel pattern is normal. No sign of an abnormal appendix. Diverticulosis of the left  colon without visible diverticulitis. Vascular/Lymphatic: Aortic atherosclerosis. No aneurysm. IVC is normal. No adenopathy. Reproductive: Retroverted uterus. Multiple leiomyomas with calcification, the largest measuring 6 cm in size. No adnexal mass. Other: No free fluid or air. Musculoskeletal: Scoliosis and chronic degenerative change of the spine. IMPRESSION: 1. Profound diffuse fatty change of the liver. 2. Moderate size hiatal hernia. 3. Diverticulosis of the left colon without visible diverticulitis. 4. Aortic atherosclerosis. 5. Multiple leiomyomas with calcification, the largest measuring 6 cm in size. 6. Scoliosis and chronic degenerative change of the spine. Aortic Atherosclerosis (ICD10-I70.0). Electronically Signed   By: Paulina Fusi M.D.   On: 10/01/2022 13:45    Procedures Procedures    Medications Ordered in ED Medications  lactated ringers bolus 1,000 mL (has no administration in time range)  ondansetron (ZOFRAN) injection 4 mg (has no administration in time range)    ED Course/ Medical Decision Making/ A&P                             Medical Decision Making Amount and/or Complexity of Data Reviewed Independent Historian: EMS Labs: ordered. Decision-making details documented in ED Course. Radiology: ordered and independent interpretation performed. Decision-making details documented in ED Course. ECG/medicine tests: ordered and independent interpretation performed. Decision-making details documented in ED Course.  Risk Prescription drug management. Decision regarding hospitalization.   1 week of nausea, vomiting and diarrhea.  Abdomen soft without peritoneal signs.  Vital stable, no distress.  Mucous membranes slightly dry.  Will give IV fluids check labs and urinalysis  Labs show leukocytosis of 13.  Lactate 2.3.  Creatinine 1.1 which is slightly elevated per baseline.  Potassium low at 2.8.  Patient given IV fluids, IV antiemetics and potassium. CT scan without  acute finding in terms of her vomiting and diarrhea.  Does show fatty liver, heel hernia and diverticulosis.  No explanation for her vomiting or surgical pathology seen.  Results reviewed interpreted by me.  On reassessment, patient states she is feeling better but weak and lightheaded.  Will attempt p.o. challenge and check orthostatics.  Daughter concerned about her going home and believes she is dehydrated.  Patient is generally weak and having difficulty with walking which is unusual for her.  Unable to give a stool sample in the ED.  Patient generally  weak, not able to walk without assistance.  Will plan admission for IV hydration.  Urinalysis is pending.  No acute surgical pathology seen on imaging.  Suspect likely viral gastroenteritis.  Unable to give stool sample in the ED.  Admission discussed with Dr. Alinda Money        Final Clinical Impression(s) / ED Diagnoses Final diagnoses:  Dehydration  Gastroenteritis    Rx / DC Orders ED Discharge Orders     None         Emmanuelle Hibbitts, Jeannett Senior, MD 10/01/22 [phone]

## 2022-10-01 NOTE — H&P (Signed)
History and Physical   Nancy Bell:[id number] DOB: 10-May-1925 DOA: 10/01/2022  PCP: Darrow Bussing, MD   Patient coming from: Home  Chief Complaint: Nausea, vomiting, diarrhea  HPI: Nancy Bell is a 89 y.o. female with medical history significant of hypertension, hyperlipidemia, anxiety, breast cancer presenting with nausea vomiting and diarrhea.  Patient reports 1 week of nausea vomiting and diarrhea.  Said multiple episodes of vomiting and diarrhea each day, hard to quantify.  Reports she has began to experience lightheadedness and generalized weakness.  Has had decreased p.o. intake due to her symptoms and has had difficulty getting herself to eat crackers and liquids.  Denies any black or bloody stools.  Denies fevers, chills, chest pain, shortness of breath.  ED Course: Vital signs in the ED stable.  Lab workup included CMP with potassium 2.8, bicarb 19, gap 16, creatinine 1.11 which is mildly elevated from baseline, glucose 150, calcium 8.7, protein 6 4.  CBC with leukocytosis to 13.4.  Troponin negative with repeat pending.  Lactic acid mildly elevated at 2.3 with repeat pending.  Lipase negative.  C. difficile and GI pathogen panel is pending.  CT abdomen pelvis showed profuse fatty liver, hiatal hernia, diverticulosis without diverticulitis, leiomyomas, scoliosis.  Patient received 20 mill equivalents IV potassium, 40 mill equivalents p.o. potassium, Zofran, 2 L of IV fluids in the ED.  Review of Systems: As per HPI otherwise all other systems reviewed and are negative.  Past Medical History:  Diagnosis Date   Anxiety    Cancer [facility])    right breast   History of hiatal hernia    Hyperlipidemia    Hypertension     Past Surgical History:  Procedure Laterality Date   BREAST BIOPSY  2 weeks ago   BREAST LUMPECTOMY  01/29/2010   Right- Dr Jamey Ripa   TOOTH EXTRACTION     TOTAL MASTECTOMY Left 09/30/2020   Procedure: LEFT TOTAL MASTECTOMY;  Surgeon: Griselda Miner, MD;   Location: [facility] OR;  Service: General;  Laterality: Left;    Social History  reports that she has never smoked. She has never used smokeless tobacco. She reports that she does not drink alcohol and does not use drugs.  Allergies  Allergen Reactions   Augmentin [Amoxicillin-Pot Clavulanate] Other (See Comments)    Develops C Diff   Vicodin [Hydrocodone-Acetaminophen] Other (See Comments)    Did not sleep for 72 hrs   Amoxicillin-Pot Clavulanate Other (See Comments)   Naproxen Other (See Comments)   Oxycodone Hcl Other (See Comments)   Tramadol Hcl Other (See Comments)   Tylenol [Acetaminophen] Nausea And Vomiting    chills    Family History  Problem Relation Age of Onset   Cancer Father        kidney   Cancer Son 61       testicular  Reviewed on admission  Prior to Admission medications   Medication Sig Start Date End Date Taking? Authorizing Provider  ALPRAZolam (XANAX) 0.25 MG tablet Take 0.125 mg by mouth daily. 01/23/15   [provider]  amLODipine (NORVASC) 2.5 MG tablet Take 2.5 mg by mouth daily.    [provider]  cholecalciferol (VITAMIN D) 1000 units tablet Take 1,000 Units by mouth daily.    [provider]  Coenzyme Q10 (CO Q 10 PO) Take 1 capsule by mouth daily.    [provider]  gabapentin (NEURONTIN) 100 MG capsule Take 1 capsule (100 mg total) by mouth 3 (three) times daily as  needed. 10/01/20 10/01/21  Chevis Pretty III, MD  hydrochlorothiazide (HYDRODIURIL) 25 MG tablet Take 25 mg by mouth Daily. 05/23/11   [provider]  lovastatin (MEVACOR) 40 MG tablet Take 40 mg by mouth daily. 01/31/15   [provider]  Multiple Vitamin (MULTIVITAMIN PO) Take 1 tablet by mouth daily.    [provider]  Omega-3 Fatty Acids (FISH OIL PO) Take 1 capsule by mouth daily.    [provider]  Potassium Chloride (KLOR-CON PO) Take 20 mEq by mouth daily.    [provider]  tamoxifen (NOLVADEX) 20 MG  tablet Take 1 tablet (20 mg total) by mouth daily. 12/10/21   Josph Macho, MD    Physical Exam: Vitals:   10/01/22 1119 10/01/22 1122 10/01/22 1127  BP:  126/73   Pulse:  95   Resp:  18   Temp:  98.6 F (37 C)   TempSrc:  Oral   SpO2: 96% 100%   Weight:   50.7 kg  Height:   5' (1.524 m)    Physical Exam Constitutional:      General: She is not in acute distress.    Appearance: Normal appearance.  HENT:     Head: Normocephalic and atraumatic.     Mouth/Throat:     Mouth: Mucous membranes are moist.     Pharynx: Oropharynx is clear.  Eyes:     Extraocular Movements: Extraocular movements intact.     Pupils: Pupils are equal, round, and reactive to light.  Cardiovascular:     Rate and Rhythm: Normal rate and regular rhythm.     Pulses: Normal pulses.     Heart sounds: Normal heart sounds.  Pulmonary:     Effort: Pulmonary effort is normal. No respiratory distress.     Breath sounds: Normal breath sounds.  Abdominal:     General: Bowel sounds are normal. There is no distension.     Palpations: Abdomen is soft.     Tenderness: There is no abdominal tenderness.  Musculoskeletal:        General: No swelling or deformity.  Skin:    General: Skin is warm and dry.  Neurological:     General: No focal deficit present.     Mental Status: Mental status is at baseline.    Labs on Admission: I have personally reviewed following labs and imaging studies  CBC: Recent Labs  Lab 10/01/22 1130  WBC 13.4*  NEUTROABS 11.9*  HGB 13.1  HCT 39.5  MCV 87.8  PLT 200    Basic Metabolic Panel: Recent Labs  Lab 10/01/22 1130  NA 135  K 2.8*  CL 100  CO2 19*  GLUCOSE 150*  BUN 17  CREATININE 1.11*  CALCIUM 8.7*    GFR: Estimated Creatinine Clearance: 21.8 mL/min (A) (by C-G formula based on SCr of 1.11 mg/dL (H)).  Liver Function Tests: Recent Labs  Lab 10/01/22 1130  AST 26  ALT 12  ALKPHOS 50  BILITOT 0.9  PROT 6.4*  ALBUMIN 3.5    Urine analysis:     Component Value Date/Time   COLORURINE YELLOW 01/27/2010 1015   APPEARANCEUR CLEAR 01/27/2010 1015   LABSPEC 1.009 01/27/2010 1015   PHURINE 7.0 01/27/2010 1015   GLUCOSEU NEGATIVE 01/27/2010 1015   HGBUR NEGATIVE 01/27/2010 1015   BILIRUBINUR NEGATIVE 01/27/2010 1015   KETONESUR NEGATIVE 01/27/2010 1015   PROTEINUR NEGATIVE 01/27/2010 1015   UROBILINOGEN 0.2 01/27/2010 1015   NITRITE NEGATIVE 01/27/2010 1015   LEUKOCYTESUR TRACE (  A) 01/27/2010 1015    Radiological Exams on Admission: CT ABDOMEN PELVIS W CONTRAST  Result Date: 10/01/2022 CLINICAL DATA:  Abdominal pain, acute, nonlocalized. Vomiting and diarrhea. Three days duration. EXAM: CT ABDOMEN AND PELVIS WITH CONTRAST TECHNIQUE: Multidetector CT imaging of the abdomen and pelvis was performed using the standard protocol following bolus administration of intravenous contrast. RADIATION DOSE REDUCTION: This exam was performed according to the departmental dose-optimization program which includes automated exposure control, adjustment of the mA and/or kV according to patient size and/or use of iterative reconstruction technique. CONTRAST:  75mL OMNIPAQUE IOHEXOL 350 MG/ML SOLN COMPARISON:  None Available. FINDINGS: Lower chest: Lung bases are clear.  Moderate size hiatal hernia. Hepatobiliary: Profound diffuse fatty change of the liver. 1 cm cyst in the right lobe. No calcified gallstones. Pancreas: Normal.  Incidental duodenal diverticulum. Spleen: Normal Adrenals/Urinary Tract: Adrenal glands are normal. Kidneys are normal. Bladder is normal. Stomach/Bowel: Hiatal hernia as noted above. Small bowel pattern is normal. No sign of an abnormal appendix. Diverticulosis of the left colon without visible diverticulitis. Vascular/Lymphatic: Aortic atherosclerosis. No aneurysm. IVC is normal. No adenopathy. Reproductive: Retroverted uterus. Multiple leiomyomas with calcification, the largest measuring 6 cm in size. No adnexal mass. Other: No free  fluid or air. Musculoskeletal: Scoliosis and chronic degenerative change of the spine. IMPRESSION: 1. Profound diffuse fatty change of the liver. 2. Moderate size hiatal hernia. 3. Diverticulosis of the left colon without visible diverticulitis. 4. Aortic atherosclerosis. 5. Multiple leiomyomas with calcification, the largest measuring 6 cm in size. 6. Scoliosis and chronic degenerative change of the spine. Aortic Atherosclerosis (ICD10-I70.0). Electronically Signed   By: Paulina Fusi M.D.   On: 10/01/2022 13:45    EKG: Independently reviewed.  Sinus rhythm at 96 bpm.  Nonspecific T wave flattening.  QTc prolonged at 505.  Low voltage multiple leads.  PAC.  Assessment/Plan Principal Problem:   Intractable nausea and vomiting Active Problems:   Hx breast cancer, IDC right LIQ,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 -   Anxiety disorder   Essential hypertension   Pure hypercholesterolemia   Hypokalemia   Intractable nausea and vomiting Hypokalemia > Patient presenting with a week of nausea vomiting and diarrhea multiple episodes today.  Now with lightheadedness and generalized weakness.  Also having decreased p.o. intake due to symptoms. > No true AKI but creatinine is elevated somewhat from baseline at 1.1.  Mildly elevated lactic acid 2.3.  Potassium 2.8. > Does have leukocytosis but CT abdomen pelvis showed no obvious infectious colitis.  No respiratory symptoms.  No urinary symptoms.  Leukocytosis believed to be likely largely reactive at this time. > Received 2 L of IV fluids, 60 mill equivalents potassium in the ED. > GI pathogen panel and C. difficile panel pending in the ED. - Monitor on telemetry overnight - Continue with IV fluids - Check magnesium - Trend renal function and electrolytes - Follow-up GI panels  Hypertension - Holding home amlodipine and hydrochlorothiazide for now  Anxiety - Continue as needed Xanax  Hyperlipidemia - Continue home statin  History of breast cancer in  remission - Continue home tamoxifen  DVT prophylaxis: Lovenox Code Status:   Full Family Communication:  Updated at bedside  Disposition Plan:   Patient is from:  Home  Anticipated DC to:  Home  Anticipated DC date:  1 to 2 days  Anticipated DC barriers: None  Consults called:  None Admission status:  Observation, telemetry  Severity of Illness: The appropriate patient status for this patient is  OBSERVATION. Observation status is judged to be reasonable and necessary in order to provide the required intensity of service to ensure the patient's safety. The patient's presenting symptoms, physical exam findings, and initial radiographic and laboratory data in the context of their medical condition is felt to place them at decreased risk for further clinical deterioration. Furthermore, it is anticipated that the patient will be medically stable for discharge from the hospital within 2 midnights of admission.    Synetta Fail MD [facility]  How to contact the [facility] Attending or Consulting provider 7A - 7P or covering provider during after hours 7P -7A, for this patient?   Check the care team in [facility] and look for a) attending/consulting [facility] provider listed and b) the [facility] team listed Log into [email or web address] and use [place]'s universal password to access. If you do not have the password, please contact the hospital operator. Locate the [facility] provider you are looking for under [facility] and page to a number that you can be directly reached. If you still have difficulty reaching the provider, please page the [facility] (Director on Call) for the Hospitalists listed on amion for assistance.  10/01/2022, 2:53 PM

## 2022-10-01 NOTE — Progress Notes (Signed)
Pt arrived to 6 north room 4. Alert and oriented x4. Pt ambulated from stretcher to bed with 1 assist. Pain level 0/10. Bed in lowest position. Call light in reach. Family at bedside. Will continue to monitor pt.

## 2022-10-01 NOTE — ED Triage Notes (Signed)
Patient was brought in from [facility] from home today with complaints of nausea, vomiting and diarrhea. Pt states that this has been occurring for about 3 days now. Denies eating anything of her knowledge that upset her stomach. Negative for stroke via EMS.

## 2022-10-01 NOTE — ED Notes (Signed)
ED TO INPATIENT HANDOFF REPORT  ED Nurse Name and Phone #: Marlis Oldaker S  S Name/Age/Gender Nancy Bell 89 y.o. female Room/Bed: 002C/002C  Code Status   Code Status: Full Code  Home/SNF/Other Home Patient oriented to: self, place, time, and situation Is this baseline? Yes   Triage Complete: Triage complete  Chief Complaint Intractable nausea and vomiting [R11.2]  Triage Note Patient was brought in from [facility] from home today with complaints of nausea, vomiting and diarrhea. Pt states that this has been occurring for about 3 days now. Denies eating anything of her knowledge that upset her stomach. Negative for stroke via EMS.    Allergies Allergies  Allergen Reactions   Augmentin [Amoxicillin-Pot Clavulanate] Other (See Comments)    Develops C Diff   Vicodin [Hydrocodone-Acetaminophen] Other (See Comments)    Did not sleep for 72 hrs   Amoxicillin-Pot Clavulanate Other (See Comments)   Naproxen Other (See Comments)   Oxycodone Hcl Other (See Comments)   Tramadol Hcl Other (See Comments)   Tylenol [Acetaminophen] Nausea And Vomiting    chills    Level of Care/Admitting Diagnosis ED Disposition     ED Disposition  Admit   Condition  --   Comment  Hospital Area: [facility] [[id number]]  Level of Care: Telemetry Medical [104]  May place patient in observation at [facility] or [facility] if equivalent level of care is available:: No  Covid Evaluation: Asymptomatic - no recent exposure (last 10 days) testing not required  Diagnosis: Intractable nausea and vomiting [720114]  Admitting Physician: Synetta Fail [[id number]]  Attending Physician: Synetta Fail [phone]          B Medical/Surgery History Past Medical History:  Diagnosis Date   Anxiety    Cancer (HCC)    right breast   History of hiatal hernia    Hyperlipidemia    Hypertension    Past Surgical History:  Procedure Laterality Date   BREAST BIOPSY  2 weeks ago    BREAST LUMPECTOMY  01/29/2010   Right- Dr Jamey Ripa   TOOTH EXTRACTION     TOTAL MASTECTOMY Left 09/30/2020   Procedure: LEFT TOTAL MASTECTOMY;  Surgeon: Griselda Miner, MD;  Location: [facility] OR;  Service: General;  Laterality: Left;     A IV Location/Drains/Wounds Patient Lines/Drains/Airways Status     Active Line/Drains/Airways     Name Placement date Placement time Site Days   Peripheral IV 10/01/22 20 G Anterior;Left;Proximal Forearm 10/01/22  1147  Forearm  less than 1   Closed System Drain 1 Left Breast Bulb (JP) 19 Fr. 09/30/20  1301  Breast  731   Incision (Closed) 09/30/20 Breast Left 09/30/20  1300  -- 731            Intake/Output Last 24 hours  Intake/Output Summary (Last 24 hours) at 10/01/2022 1503 Last data filed at 10/01/2022 1443 Gross per 24 hour  Intake 1100 ml  Output --  Net 1100 ml    Labs/Imaging Results for orders placed or performed during the hospital encounter of 10/01/22 (from the past 48 hour(s))  CBC with Differential     Status: Abnormal   Collection Time: 10/01/22 11:30 AM  Result Value Ref Range   WBC 13.4 (H) 4.0 - 10.5 K/uL   RBC 4.50 3.87 - 5.11 MIL/uL   Hemoglobin 13.1 12.0 - 15.0 g/dL   HCT 19.1 47.8 - 29.5 %   MCV 87.8 80.0 - 100.0 fL   MCH 29.1  26.0 - 34.0 pg   MCHC 33.2 30.0 - 36.0 g/dL   RDW 16.1 09.6 - 04.5 %   Platelets 200 150 - 400 K/uL   nRBC 0.0 0.0 - 0.2 %   Neutrophils Relative % 89 %   Neutro Abs 11.9 (H) 1.7 - 7.7 K/uL   Lymphocytes Relative 6 %   Lymphs Abs 0.8 0.7 - 4.0 K/uL   Monocytes Relative 5 %   Monocytes Absolute 0.7 0.1 - 1.0 K/uL   Eosinophils Relative 0 %   Eosinophils Absolute 0.0 0.0 - 0.5 K/uL   Basophils Relative 0 %   Basophils Absolute 0.0 0.0 - 0.1 K/uL   Immature Granulocytes 0 %   Abs Immature Granulocytes 0.04 0.00 - 0.07 K/uL    Comment: Performed at [facility], [address] [place], [place] [address]  Comprehensive metabolic panel     Status: Abnormal   Collection Time:  10/01/22 11:30 AM  Result Value Ref Range   Sodium 135 135 - 145 mmol/L   Potassium 2.8 (L) 3.5 - 5.1 mmol/L   Chloride 100 98 - 111 mmol/L   CO2 19 (L) 22 - 32 mmol/L   Glucose, Bld 150 (H) 70 - 99 mg/dL    Comment: Glucose reference range applies only to samples taken after fasting for at least 8 hours.   BUN 17 8 - 23 mg/dL   Creatinine, Ser 1.91 (H) 0.44 - 1.00 mg/dL   Calcium 8.7 (L) 8.9 - 10.3 mg/dL   Total Protein 6.4 (L) 6.5 - 8.1 g/dL   Albumin 3.5 3.5 - 5.0 g/dL   AST 26 15 - 41 U/L   ALT 12 0 - 44 U/L   Alkaline Phosphatase 50 38 - 126 U/L   Total Bilirubin 0.9 0.3 - 1.2 mg/dL   GFR, Estimated 46 (L) >60 mL/min    Comment: (NOTE) Calculated using the CKD-EPI Creatinine Equation (2021)    Anion gap 16 (H) 5 - 15    Comment: Performed at [facility], [address] [place], [place] [address]  Lipase, blood     Status: None   Collection Time: 10/01/22 11:30 AM  Result Value Ref Range   Lipase 27 11 - 51 U/L    Comment: Performed at [facility], [address] [place], [place] [address]  Troponin I (High Sensitivity)     Status: None   Collection Time: 10/01/22 11:30 AM  Result Value Ref Range   Troponin I (High Sensitivity) 8 <18 ng/L    Comment: (NOTE) Elevated high sensitivity troponin I (hsTnI) values and significant  changes across serial measurements may suggest ACS but many other  chronic and acute conditions are known to elevate hsTnI results.  Refer to the "Links" section for chest pain algorithms and additional  guidance. Performed at [facility], [address], [place], [place] [address]   Lactic acid, plasma     Status: Abnormal   Collection Time: 10/01/22 11:48 AM  Result Value Ref Range   Lactic Acid, Venous 2.3 (HH) 0.5 - 1.9 mmol/L    Comment: CRITICAL RESULT CALLED TO, READ BACK BY AND VERIFIED WITH Sharl Ma, RN @ 1255 10/01/22 BY [facility] Performed at [facility], [address], [place], [place] [address]     CT ABDOMEN PELVIS W CONTRAST  Result Date: 10/01/2022 CLINICAL DATA:  Abdominal pain, acute, nonlocalized. Vomiting and diarrhea. Three days duration. EXAM: CT ABDOMEN AND PELVIS WITH  CONTRAST TECHNIQUE: Multidetector CT imaging of the abdomen and pelvis was performed using the standard protocol following bolus administration of intravenous contrast. RADIATION DOSE REDUCTION: This exam was performed according to the departmental dose-optimization program which includes automated exposure control, adjustment of the mA and/or kV according to patient size and/or use of iterative reconstruction technique. CONTRAST:  75mL OMNIPAQUE IOHEXOL 350 MG/ML SOLN COMPARISON:  None Available. FINDINGS: Lower chest: Lung bases are clear.  Moderate size hiatal hernia. Hepatobiliary: Profound diffuse fatty change of the liver. 1 cm cyst in the right lobe. No calcified gallstones. Pancreas: Normal.  Incidental duodenal diverticulum. Spleen: Normal Adrenals/Urinary Tract: Adrenal glands are normal. Kidneys are normal. Bladder is normal. Stomach/Bowel: Hiatal hernia as noted above. Small bowel pattern is normal. No sign of an abnormal appendix. Diverticulosis of the left colon without visible diverticulitis. Vascular/Lymphatic: Aortic atherosclerosis. No aneurysm. IVC is normal. No adenopathy. Reproductive: Retroverted uterus. Multiple leiomyomas with calcification, the largest measuring 6 cm in size. No adnexal mass. Other: No free fluid or air. Musculoskeletal: Scoliosis and chronic degenerative change of the spine. IMPRESSION: 1. Profound diffuse fatty change of the liver. 2. Moderate size hiatal hernia. 3. Diverticulosis of the left colon without visible diverticulitis. 4. Aortic atherosclerosis. 5. Multiple leiomyomas with calcification, the largest measuring 6 cm in size. 6. Scoliosis and chronic degenerative change of the spine. Aortic Atherosclerosis (ICD10-I70.0). Electronically Signed   By: Paulina Fusi M.D.   On:  10/01/2022 13:45    Pending Labs Unresulted Labs (From admission, onward)     Start     Ordered   10/08/22 0500  Creatinine, serum  (enoxaparin (LOVENOX)    CrCl < 30 ml/min)  Once,   R       Comments: while on enoxaparin therapy.    10/01/22 1444   10/02/22 0500  Comprehensive metabolic panel  Tomorrow morning,   R        10/01/22 1444   10/02/22 0500  CBC  Tomorrow morning,   R        10/01/22 1444   10/01/22 1449  Magnesium  Add-on,   AD        10/01/22 1448   10/01/22 1131  Lactic acid, plasma  Now then every 2 hours,   R (with STAT occurrences)      10/01/22 1130   10/01/22 1131  C Difficile Quick Screen w PCR reflex  (C Difficile quick screen w PCR reflex panel )  Once, for 24 hours,   URGENT       References:    CDiff Information Tool   10/01/22 1130   10/01/22 1131  Gastrointestinal Panel by PCR , Stool  (Gastrointestinal Panel by PCR, Stool                                                                                                                                                     **  Does Not include CLOSTRIDIUM DIFFICILE testing. **If CDIFF testing is needed, place order from the "C Difficile Testing" order set.**)  Once,   URGENT        10/01/22 1130            Vitals/Pain Today's Vitals   10/01/22 1119 10/01/22 1122 10/01/22 1127  BP:  126/73   Pulse:  95   Resp:  18   Temp:  98.6 F (37 C)   TempSrc:  Oral   SpO2: 96% 100%   Weight:   50.7 kg  Height:   5' (1.524 m)  PainSc:   0-No pain    Isolation Precautions Enteric precautions (UV disinfection)  Medications Medications  potassium chloride 10 mEq in 100 mL IVPB (10 mEq Intravenous New Bag/Given 10/01/22 1444)  lactated ringers bolus 1,000 mL (has no administration in time range)  enoxaparin (LOVENOX) injection 30 mg (has no administration in time range)  sodium chloride flush (NS) 0.9 % injection 3 mL (has no administration in time range)  0.9 %  sodium chloride infusion (has no administration  in time range)  lactated ringers bolus 1,000 mL (0 mLs Intravenous Stopped 10/01/22 1346)  ondansetron (ZOFRAN) injection 4 mg (4 mg Intravenous Given 10/01/22 1148)  potassium chloride SA (KLOR-CON M) CR tablet 40 mEq (40 mEq Oral Given 10/01/22 1344)  iohexol (OMNIPAQUE) 350 MG/ML injection 75 mL (75 mLs Intravenous Contrast Given 10/01/22 1332)    Mobility walks     Focused Assessments    R Recommendations: See Admitting Provider Note  Report given to:   Additional Notes: pt does have some incont issues but she states she wears a pad and will the nurse know when she needs to use the restroom

## 2022-10-01 NOTE — Progress Notes (Signed)
Clear liquid tray ordered. Pt set up on automatic trays.

## 2022-10-02 DIAGNOSIS — E876 Hypokalemia: Secondary | ICD-10-CM | POA: Diagnosis not present

## 2022-10-02 DIAGNOSIS — Z853 Personal history of malignant neoplasm of breast: Secondary | ICD-10-CM | POA: Diagnosis not present

## 2022-10-02 DIAGNOSIS — I10 Essential (primary) hypertension: Secondary | ICD-10-CM | POA: Diagnosis not present

## 2022-10-02 DIAGNOSIS — R112 Nausea with vomiting, unspecified: Secondary | ICD-10-CM | POA: Diagnosis not present

## 2022-10-02 DIAGNOSIS — E7800 Pure hypercholesterolemia, unspecified: Secondary | ICD-10-CM | POA: Diagnosis not present

## 2022-10-02 DIAGNOSIS — F419 Anxiety disorder, unspecified: Secondary | ICD-10-CM | POA: Diagnosis not present

## 2022-10-02 LAB — CBC
HCT: 33.8 % — ABNORMAL LOW (ref 36.0–46.0)
Hemoglobin: 11.5 g/dL — ABNORMAL LOW (ref 12.0–15.0)
MCH: 30.2 pg (ref 26.0–34.0)
MCHC: 34 g/dL (ref 30.0–36.0)
MCV: 88.7 fL (ref 80.0–100.0)
Platelets: 146 10*3/uL — ABNORMAL LOW (ref 150–400)
RBC: 3.81 MIL/uL — ABNORMAL LOW (ref 3.87–5.11)
RDW: 12.3 % (ref 11.5–15.5)
WBC: 8.6 10*3/uL (ref 4.0–10.5)
nRBC: 0 % (ref 0.0–0.2)

## 2022-10-02 LAB — COMPREHENSIVE METABOLIC PANEL
ALT: 11 U/L (ref 0–44)
AST: 23 U/L (ref 15–41)
Albumin: 2.8 g/dL — ABNORMAL LOW (ref 3.5–5.0)
Alkaline Phosphatase: 39 U/L (ref 38–126)
Anion gap: 9 (ref 5–15)
BUN: 11 mg/dL (ref 8–23)
CO2: 22 mmol/L (ref 22–32)
Calcium: 7.9 mg/dL — ABNORMAL LOW (ref 8.9–10.3)
Chloride: 105 mmol/L (ref 98–111)
Creatinine, Ser: 0.77 mg/dL (ref 0.44–1.00)
GFR, Estimated: >60 mL/min (ref >60)
Glucose, Bld: 101 mg/dL — ABNORMAL HIGH (ref 70–99)
Potassium: 3.1 mmol/L — ABNORMAL LOW (ref 3.5–5.1)
Sodium: 136 mmol/L (ref 135–145)
Total Bilirubin: 1.1 mg/dL (ref 0.3–1.2)
Total Protein: 5.1 g/dL — ABNORMAL LOW (ref 6.5–8.1)

## 2022-10-02 MED ORDER — LOPERAMIDE HCL 2 MG PO TABS: 2.0000 mg | ORAL_TABLET | Freq: Three times a day (TID) | ORAL | 0 refills | Status: DC | PRN

## 2022-10-02 MED ORDER — POTASSIUM CHLORIDE CRYS ER 20 MEQ PO TBCR
40.0000 meq | EXTENDED_RELEASE_TABLET | Freq: Once | ORAL | Status: AC
  Administered 2022-10-02: 40 meq via ORAL
  Filled 2022-10-02: qty 2

## 2022-10-02 MED ORDER — HYDROCHLOROTHIAZIDE 25 MG PO TABS: 25.0000 mg | ORAL_TABLET | Freq: Every day | ORAL | Status: DC

## 2022-10-02 MED ORDER — ONDANSETRON HCL 4 MG PO TABS: 4.0000 mg | ORAL_TABLET | Freq: Three times a day (TID) | ORAL | 0 refills | Status: DC | PRN

## 2022-10-02 NOTE — Hospital Course (Addendum)
   Assessment and plan.  

## 2022-10-02 NOTE — Progress Notes (Signed)
Patient is being discharged home. All discharge instructions reviewed with patient and her daughter. Both verbalized a full understanding to instructions. Equipment to be delivered to room. Pt daughter is her ride home.

## 2022-10-02 NOTE — Discharge Summary (Signed)
Physician Discharge Summary  Nancy Bell:[id number] DOB: 04-29-1925 DOA: 10/01/2022  PCP: Darrow Bussing, MD  Admit date: 10/01/2022 Discharge date: 10/02/2022  Admitted From: Home  Discharge disposition: Home  Recommendations for Outpatient Follow-Up:   Follow up with your primary care provider in one week.  Check CBC, BMP, magnesium in the next visit  Discharge Diagnosis:   Principal Problem:   Intractable nausea and vomiting Active Problems:   Hx breast cancer, IDC right LIQ,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 -   Anxiety disorder   Essential hypertension   Pure hypercholesterolemia   Hypokalemia  Discharge Condition: Improved.  Diet recommendation: Low sodium, heart healthy.    Wound care: None.  Code status: Full.   History of Present Illness:   Nancy Bell is a 89 y.o. female with medical history of hypertension, hyperlipidemia, anxiety, breast cancer presented hospital with nausea vomiting and diarrhea.  Patient has had lightheadedness generalized weakness and decreased oral intake.  In the ED, patient was hypokalemic with potassium of 2.8 with a creatinine of 1.1 slightly elevated from baseline.  CBC showed WBC at 13.4.  Lactic acid mildly elevated at 2.3.  CT abdomen pelvis showed profuse fatty liver, hiatal hernia, diverticulosis without diverticulitis, leiomyomas, scoliosis.  Patient received 20 mill equivalents IV potassium, 40 mill equivalents p.o. potassium, Zofran, 2 L of IV fluids in the ED and was admitted hospital for further evaluation and treatment.  Hospital Course:   Following conditions were addressed during hospitalization as listed below,  Intractable nausea and vomiting Resolved at this time.   Patient received IV fluids antiemetics supportive care.  GI pathogen panel and C. difficile negative.  Lactate was initially elevated likely secondary to volume repletion.  At this time patient has tolerated oral diet.  Patient had no further symptoms.   Could be secondary to viral illness.  Will be given antiemetics and antidiarrheal on discharge.  Encouraged oral hydration.  Mild thrombocytopenia.  Follow-up with PCP as outpatient.  Hypokalemia Received replacement.  Latest potassium of 3.1.  Patient does take a potassium twice daily at home.  Advised to take hydrochlorothiazide starting 10/05/2022  Hypertension Will resume amlodipine and hydrochlorothiazide on discharge.  HCTZ  to be restarted 10/05/2022  Anxiety - Continue as needed Xanax   Hyperlipidemia Resume statins   History of breast cancer in remission Continue tamoxifen  Disposition.  At this time, patient is stable for disposition home with outpatient PCP follow-up.  Medical Consultants:   None.  Procedures:    None Subjective:   Today, patient was seen and examined at bedside.  No nausea vomiting or diarrhea today.  Feels better.  Feels hydrated.  Discharge Exam:   Vitals:   10/02/22 0508 10/02/22 0802  BP: (!) 101/48 (!) 110/47  Pulse: 76 71  Resp: 17 17  Temp: 97.8 F (36.6 C) 98.2 F (36.8 C)  SpO2: 98% 97%   Vitals:   10/01/22 1644 10/01/22 2202 10/02/22 0508 10/02/22 0802  BP: 132/65 113/62 (!) 101/48 (!) 110/47  Pulse: 94 81 76 71  Resp: 15 16 17 17   Temp: 98.2 F (36.8 C) 98.4 F (36.9 C) 97.8 F (36.6 C) 98.2 F (36.8 C)  TempSrc: Oral Oral Oral Oral  SpO2: 100% 98% 98% 97%  Weight:      Height:       Body mass index is 21.83 kg/m.  General: Alert awake, not in obvious distress, elderly female, HENT: pupils equally reacting to light,  No scleral pallor  or icterus noted. Oral mucosa is moist.  Chest:  Clear breath sounds.  No crackles or wheezes.  CVS: S1 &S2 heard. No murmur.  Regular rate and rhythm. Abdomen: Soft, nontender, nondistended.  Bowel sounds are heard.   Extremities: No cyanosis, clubbing or edema.  Peripheral pulses are palpable. Psych: Alert, awake and oriented, normal mood CNS:  No cranial nerve deficits.  Power  equal in all extremities.   Skin: Warm and dry.  No rashes noted.  The results of significant diagnostics from this hospitalization (including imaging, microbiology, ancillary and laboratory) are listed below for reference.     Diagnostic Studies:   CT ABDOMEN PELVIS W CONTRAST  Result Date: 10/01/2022 CLINICAL DATA:  Abdominal pain, acute, nonlocalized. Vomiting and diarrhea. Three days duration. EXAM: CT ABDOMEN AND PELVIS WITH CONTRAST TECHNIQUE: Multidetector CT imaging of the abdomen and pelvis was performed using the standard protocol following bolus administration of intravenous contrast. RADIATION DOSE REDUCTION: This exam was performed according to the departmental dose-optimization program which includes automated exposure control, adjustment of the mA and/or kV according to patient size and/or use of iterative reconstruction technique. CONTRAST:  75mL OMNIPAQUE IOHEXOL 350 MG/ML SOLN COMPARISON:  None Available. FINDINGS: Lower chest: Lung bases are clear.  Moderate size hiatal hernia. Hepatobiliary: Profound diffuse fatty change of the liver. 1 cm cyst in the right lobe. No calcified gallstones. Pancreas: Normal.  Incidental duodenal diverticulum. Spleen: Normal Adrenals/Urinary Tract: Adrenal glands are normal. Kidneys are normal. Bladder is normal. Stomach/Bowel: Hiatal hernia as noted above. Small bowel pattern is normal. No sign of an abnormal appendix. Diverticulosis of the left colon without visible diverticulitis. Vascular/Lymphatic: Aortic atherosclerosis. No aneurysm. IVC is normal. No adenopathy. Reproductive: Retroverted uterus. Multiple leiomyomas with calcification, the largest measuring 6 cm in size. No adnexal mass. Other: No free fluid or air. Musculoskeletal: Scoliosis and chronic degenerative change of the spine. IMPRESSION: 1. Profound diffuse fatty change of the liver. 2. Moderate size hiatal hernia. 3. Diverticulosis of the left colon without visible diverticulitis. 4.  Aortic atherosclerosis. 5. Multiple leiomyomas with calcification, the largest measuring 6 cm in size. 6. Scoliosis and chronic degenerative change of the spine. Aortic Atherosclerosis (ICD10-I70.0). Electronically Signed   By: Paulina Fusi M.D.   On: 10/01/2022 13:45     Labs:   Basic Metabolic Panel: Recent Labs  Lab 10/01/22 1130 10/01/22 1330 10/02/22 0102  NA 135  --  136  K 2.8*  --  3.1*  CL 100  --  105  CO2 19*  --  22  GLUCOSE 150*  --  101*  BUN 17  --  11  CREATININE 1.11*  --  0.77  CALCIUM 8.7*  --  7.9*  MG  --  1.8  --    GFR Estimated Creatinine Clearance: 30.2 mL/min (by C-G formula based on SCr of 0.77 mg/dL). Liver Function Tests: Recent Labs  Lab 10/01/22 1130 10/02/22 0102  AST 26 23  ALT 12 11  ALKPHOS 50 39  BILITOT 0.9 1.1  PROT 6.4* 5.1*  ALBUMIN 3.5 2.8*   Recent Labs  Lab 10/01/22 1130  LIPASE 27   No results for input(s): "AMMONIA" in the last 168 hours. Coagulation profile No results for input(s): "INR", "PROTIME" in the last 168 hours.  CBC: Recent Labs  Lab 10/01/22 1130 10/02/22 0102  WBC 13.4* 8.6  NEUTROABS 11.9*  --   HGB 13.1 11.5*  HCT 39.5 33.8*  MCV 87.8 88.7  PLT 200 146*   Cardiac  Enzymes: No results for input(s): "CKTOTAL", "CKMB", "CKMBINDEX", "TROPONINI" in the last 168 hours. BNP: Invalid input(s): "POCBNP" CBG: No results for input(s): "GLUCAP" in the last 168 hours. D-Dimer No results for input(s): "DDIMER" in the last 72 hours. Hgb A1c No results for input(s): "HGBA1C" in the last 72 hours. Lipid Profile No results for input(s): "CHOL", "HDL", "LDLCALC", "TRIG", "CHOLHDL", "LDLDIRECT" in the last 72 hours. Thyroid function studies No results for input(s): "TSH", "T4TOTAL", "T3FREE", "THYROIDAB" in the last 72 hours.  Invalid input(s): "FREET3" Anemia work up No results for input(s): "VITAMINB12", "FOLATE", "FERRITIN", "TIBC", "IRON", "RETICCTPCT" in the last 72 hours. Microbiology No results  found for this or any previous visit (from the past 240 hour(s)).   Discharge Instructions:   Discharge Instructions     Call MD for:  persistant nausea and vomiting   Complete by: As directed    Call MD for:  severe uncontrolled pain   Complete by: As directed    Diet - low sodium heart healthy   Complete by: As directed    Discharge instructions   Complete by: As directed    Follow-up with your primary care provider in 1 week.  Check blood work at that time.  Start taking hydrochlorothiazide from 10/05/2022.  Increase fluid intake.  Seek medical attention for worsening symptoms.   Increase activity slowly   Complete by: As directed       Allergies as of 10/02/2022       Reactions   Augmentin [amoxicillin-pot Clavulanate] Other (See Comments)   Develops C Diff   Vicodin [hydrocodone-acetaminophen] Other (See Comments)   Insomnia    Oxycontin [oxycodone Hcl] Other (See Comments)   Unknown reaction   Tylenol [acetaminophen] Nausea And Vomiting   Chills    Ultram [tramadol Hcl] Other (See Comments)   Unknown reaction        Medication List     TAKE these medications    ALPRAZolam 0.25 MG tablet Commonly known as: XANAX Take 0.125 mg by mouth daily.   amLODipine 2.5 MG tablet Commonly known as: NORVASC Take 2.5 mg by mouth daily.   CO Q 10 PO Take 1 capsule by mouth daily.   FISH OIL PO Take 1 capsule by mouth daily.   hydrochlorothiazide 25 MG tablet Commonly known as: HYDRODIURIL Take 1 tablet (25 mg total) by mouth daily. Start taking on: Oct 05, 2022 What changed:  when to take this These instructions start on Oct 05, 2022. If you are unsure what to do until then, ask your doctor or other care provider.   KLOR-CON PO Take 20 mEq by mouth 2 (two) times daily.   loperamide 2 MG tablet Commonly known as: Imodium A-D Take 1 tablet (2 mg total) by mouth 3 (three) times daily as needed for diarrhea or loose stools.   lovastatin 40 MG tablet Commonly  known as: MEVACOR Take 40 mg by mouth daily.   MULTIVITAMIN PO Take 1 tablet by mouth daily.   naproxen sodium 220 MG tablet Commonly known as: ALEVE Take 220 mg by mouth daily as needed (pain).   ondansetron 4 MG tablet Commonly known as: Zofran Take 1 tablet (4 mg total) by mouth every 8 (eight) hours as needed for nausea or vomiting.   tamoxifen 20 MG tablet Commonly known as: NOLVADEX Take 1 tablet (20 mg total) by mouth daily.   VITAMIN D-3 PO Take 1 capsule by mouth daily.          Time coordinating  discharge: 39 minutes  Signed:  Julious Langlois  [facility] 10/02/2022, 2:18 PM

## 2022-10-02 NOTE — TOC Initial Note (Addendum)
Transition of Care [facility]) - Initial/Assessment Note    Patient Details  Name: Nancy Bell MRN: [id number] Date of Birth: [birth year]  Transition of Care [facility]) CM/SW Contact:    Kingsley Plan, RN Phone Number: 10/02/2022, 1:30 PM  Clinical Narrative:                  Spoke to patient and daughter Nancy Bell at bedside. Patient fromhome with daughter and son in law.   PT recommending HHPT , bedside commode and walker. Patient declining HHPT and DME. Cheryl in agreement with no HHPT but would like walker and bedside commode. DME ordered with Beckie [facility] [employer]    Expected Discharge Plan: Home w Home Health Services Barriers to Discharge: Continued Medical Work up   Patient Goals and CMS Choice Patient states their goals for this hospitalization and ongoing recovery are:: to return to home     [facility] ownership interest in [facility].provided to:: Patient    Expected Discharge Plan and Services   Discharge Planning Services: CM Consult Post Acute Care Choice: Home Health, Durable Medical Equipment Living arrangements for the past 2 months: Single Family Home                 DME Arranged: 3-N-1, Walker rolling [employer]  Date DME Agency Contacted: 10/02/22 HH Arranged: Patient Refused HH          Prior Living Arrangements/Services Living arrangements for the past 2 months: Single Family Home Lives with:: Adult Children Patient language and need for interpreter reviewed:: Yes Do you feel safe going back to the place where you live?: Yes      Need for Family Participation in Patient Care: Yes (Comment) Care giver support system in place?: Yes (comment)   Criminal Activity/Legal Involvement Pertinent to Current Situation/Hospitalization: No - Comment as needed  Activities of Daily Living      Permission Sought/Granted   Permission granted to share information with : Yes, Verbal Permission Granted  Share Information with NAME: daughter Nancy Bell            Emotional Assessment Appearance:: Appears stated age Attitude/Demeanor/Rapport: Engaged Affect (typically observed): Accepting Orientation: : Oriented to Self, Oriented to Place, Oriented to  Time, Oriented to Situation Alcohol / Substance Use: Not Applicable Psych Involvement: No (comment)  Admission diagnosis:  Dehydration [E86.0] Gastroenteritis [K52.9] Intractable nausea and vomiting [R11.2] Patient Active Problem List   Diagnosis Date Noted   Intractable nausea and vomiting 10/01/2022   Hypokalemia 10/01/2022   Age-related osteoporosis without current pathological fracture 09/30/2021   Anxiety disorder 09/30/2021   Dysphagia 09/30/2021   Essential hypertension 09/30/2021   Pure hypercholesterolemia 09/30/2021   Hyperglycemia 09/30/2021   Atherosclerosis of abdominal aorta (HCC) 09/30/2021   Cancer of left female breast (HCC) 09/30/2020   Hx breast cancer, IDC right LIQ,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   PCP:  Darrow Bussing, MD Pharmacy:   [id number] - [place], [place] - [address]  [address] Phone: [phone] Fax: [phone]     Social Determinants of Health (SDOH) Social History: SDOH Screenings   Tobacco Use: Low Risk  (10/01/2022)   SDOH Interventions:     Readmission Risk Interventions     No data to display

## 2022-10-02 NOTE — Evaluation (Signed)
Physical Therapy Evaluation Patient Details Name: Nancy Bell MRN: [id number] DOB: [birth year] Today's Date: 10/02/2022  History of Present Illness  89 y.o. female presenting to ED via EMS 5/23 with nausea vomiting and diarrhea. Labs reveal slightly elevated creatinine and lactic acid. CT abdomen pelvis showed profuse fatty liver, hiatal hernia, diverticulosis without diverticulitis, leiomyomas, scoliosis PMH: hypertension, hyperlipidemia, anxiety, breast cancer  Clinical Impression  PTA pt living with daughter, grand daughter and great granddaughter in single story home with 4 steps to enter. Pt report independence with ambulation without AD, and independence with bathing and dressing, family assists with iADLs. Pt is limited in safe mobility by generalized weakness and decreased balance and endurance. Pt is min A for bed mobility, transfers and short distance ambulation without AD. Pt has decreased awareness of her weakness and associated deficits. Educated on need for RW and BSC to reduce risk of falling until she regains her strength. PT recommends BSC given frequent trips to bathroom with diarrhea especially in middle of night. Pt hopeful for discharge this afternoon, PT will continue to follow acutely if plans change.        Recommendations for follow up therapy are one component of a multi-disciplinary discharge planning process, led by the attending physician.  Recommendations may be updated based on patient status, additional functional criteria and insurance authorization.     Assistance Recommended at Discharge Frequent or constant Supervision/Assistance  Patient can return home with the following  A little help with walking and/or transfers;A little help with bathing/dressing/bathroom;Assistance with cooking/housework;Direct supervision/assist for medications management;Direct supervision/assist for financial management;Assist for transportation;Help with stairs or ramp for  entrance    Equipment Recommendations Rolling walker (2 wheels);BSC/3in1     Functional Status Assessment Patient has had a recent decline in their functional status and demonstrates the ability to make significant improvements in function in a reasonable and predictable amount of time.     Precautions / Restrictions Precautions Precautions: Fall Restrictions Weight Bearing Restrictions: No      Mobility  Bed Mobility Overal bed mobility: Needs Assistance Bed Mobility: Supine to Sit     Supine to sit: HOB elevated     General bed mobility comments: witnessed RN providing assistance for pt to come to EoB with HoB elevated    Transfers Overall transfer level: Needs assistance Equipment used: 1 person hand held assist Transfers: Sit to/from Stand, Bed to chair/wheelchair/BSC Sit to Stand: Min assist   Step pivot transfers: Min assist       General transfer comment: pt has good power up but requires assistance for steadying with coming to standing and for pivoting to [facility]    Ambulation/Gait Ambulation/Gait assistance: Min assist Gait Distance (Feet): 20 Feet Assistive device: 1 person hand held assist Gait Pattern/deviations: Step-through pattern, Narrow base of support, Shuffle, Drifts right/left Gait velocity: slowed Gait velocity interpretation: <1.8 ft/sec, indicate of risk for recurrent falls   General Gait Details: min HHA for steadying with ambulation in room from [facility] to recliner, pt reporting independence in her ambulation at baseline and when demonstrated for PT used increased UE on furniture and has 1x LoB requiring min a for steadyin g        Balance Overall balance assessment: Needs assistance Sitting-balance support: Feet supported, No upper extremity supported Sitting balance-Leahy Scale: Good     Standing balance support: Single extremity supported, Bilateral upper extremity supported, No upper extremity supported, During functional  activity Standing balance-Leahy Scale: Fair Standing balance comment: dynamic balance improved with  UE support                             Pertinent Vitals/Pain Pain Assessment Pain Assessment: No/denies pain    Home Living Family/patient expects to be discharged to:: Private residence Living Arrangements: Children Available Help at Discharge: Family;Available 24 hours/day Type of Home: House Home Access: Stairs to enter Entrance Stairs-Rails: Right Entrance Stairs-Number of Steps: 4   Home Layout: One level Home Equipment: Hand held shower head Additional Comments: family to upfit bathroom soon with grab bars, and shower seat    Prior Function Prior Level of Function : Needs assist             Mobility Comments: ambulates household distances  without AD ADLs Comments: independent in ADLs, family assists with iADLs, including medication management     Hand Dominance   Dominant Hand: Right    Extremity/Trunk Assessment   Upper Extremity Assessment Upper Extremity Assessment: Overall WFL for tasks assessed (pt has a tremor at baseline)    Lower Extremity Assessment Lower Extremity Assessment: Generalized weakness    Cervical / Trunk Assessment Cervical / Trunk Assessment: Kyphotic  Communication   Communication: No difficulties  Cognition Arousal/Alertness: Awake/alert Behavior During Therapy: WFL for tasks assessed/performed, Impulsive Overall Cognitive Status: Impaired/Different from baseline Area of Impairment: Orientation, Following commands, Safety/judgement, Problem solving                 Orientation Level: Disoriented to, Time     Following Commands: Follows multi-step commands inconsistently Safety/Judgement: Decreased awareness of deficits, Decreased awareness of safety   Problem Solving: Requires verbal cues, Requires tactile cues General Comments: Pt is disoriented to time, has difficulty remembering home set up and deflects  memory deficits with humor        General Comments General comments (skin integrity, edema, etc.): VSS on RA    Exercises Other Exercises Other Exercises: Discussed home walking program   Assessment/Plan    PT Assessment Patient does not need any further PT services         PT Goals (Current goals can be found in the Care Plan section)  Acute Rehab PT Goals PT Goal Formulation: With patient/family Time For Goal Achievement: 10/16/22 Potential to Achieve Goals: Fair     AM-PAC PT "6 Clicks" Mobility  Outcome Measure Help needed turning from your back to your side while in a flat bed without using bedrails?: None Help needed moving from lying on your back to sitting on the side of a flat bed without using bedrails?: None Help needed moving to and from a bed to a chair (including a wheelchair)?: A Little Help needed standing up from a chair using your arms (e.g., wheelchair or bedside chair)?: A Little Help needed to walk in hospital room?: A Little Help needed climbing 3-5 steps with a railing? : A Little 6 Click Score: 20    End of Session Equipment Utilized During Treatment: Gait belt Activity Tolerance: Patient tolerated treatment well Patient left: in chair;with call bell/phone within reach;with chair alarm set;with family/visitor present Nurse Communication: Mobility status PT Visit Diagnosis: Unsteadiness on feet (R26.81);Muscle weakness (generalized) (M62.81)    Time: 4098-1191 PT Time Calculation (min) (ACUTE ONLY): 23 min   Charges:   PT Evaluation $PT Eval Moderate Complexity: 1 Mod PT Treatments $Gait Training: 8-22 mins        Abbigaile Rockman B. Beverely Risen PT, DPT Acute Rehabilitation Services Please use secure chat  or  Call Office [phone]   Elon Alas [facility] 10/02/2022, 11:35 AM

## 2022-10-09 DIAGNOSIS — C50012 Malignant neoplasm of nipple and areola, left female breast: Secondary | ICD-10-CM | POA: Diagnosis not present

## 2022-10-09 DIAGNOSIS — I700 Atherosclerosis of aorta: Secondary | ICD-10-CM | POA: Diagnosis not present

## 2022-10-09 DIAGNOSIS — K449 Diaphragmatic hernia without obstruction or gangrene: Secondary | ICD-10-CM | POA: Diagnosis not present

## 2022-10-09 DIAGNOSIS — R197 Diarrhea, unspecified: Secondary | ICD-10-CM | POA: Diagnosis not present

## 2022-10-09 DIAGNOSIS — K5790 Diverticulosis of intestine, part unspecified, without perforation or abscess without bleeding: Secondary | ICD-10-CM | POA: Diagnosis not present

## 2022-10-09 DIAGNOSIS — E876 Hypokalemia: Secondary | ICD-10-CM | POA: Diagnosis not present

## 2022-10-09 DIAGNOSIS — K760 Fatty (change of) liver, not elsewhere classified: Secondary | ICD-10-CM | POA: Diagnosis not present

## 2022-10-09 DIAGNOSIS — Z170 Estrogen receptor positive status [ER+]: Secondary | ICD-10-CM | POA: Diagnosis not present

## 2022-10-09 DIAGNOSIS — R42 Dizziness and giddiness: Secondary | ICD-10-CM | POA: Diagnosis not present

## 2022-10-31 IMAGING — US US BREAST BX W LOC DEV 1ST LESION IMG BX SPEC US GUIDE*L*
1 series · 11 of 11 positions shown · non-contrast
Comparison: Previous exam(s).
COMPARISON: Previous exam(s).

Addendum:
CLINICAL DATA: [AGE] female presenting for biopsy of a left
breast mass.

EXAM:
ULTRASOUND GUIDED LEFT BREAST CORE NEEDLE BIOPSY

[Series 1: us breast bx w loc dev 1st lesion img bx spec us g · 0.07mm/px · 11 of 11 slices shown]
[im 1/11]
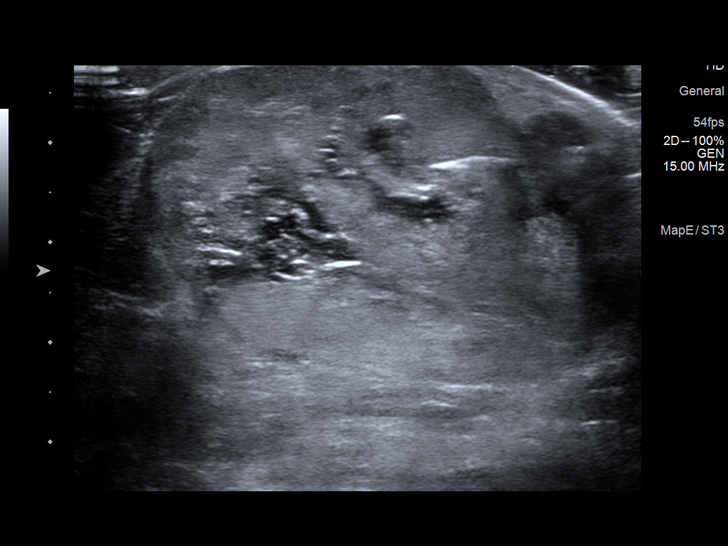
[im 2/11]
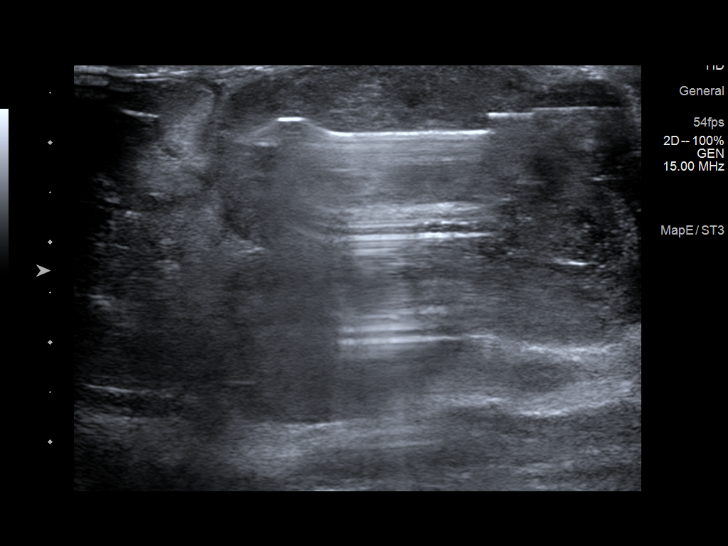
[im 3/11]
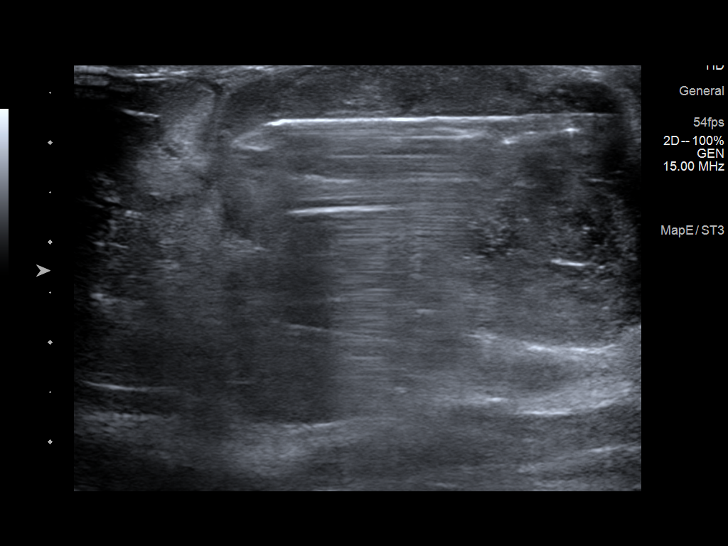
[im 4/11]
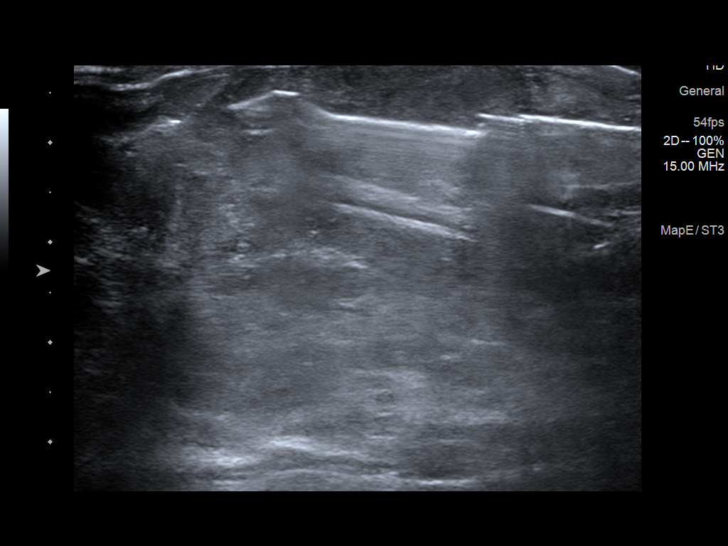
[im 5/11]
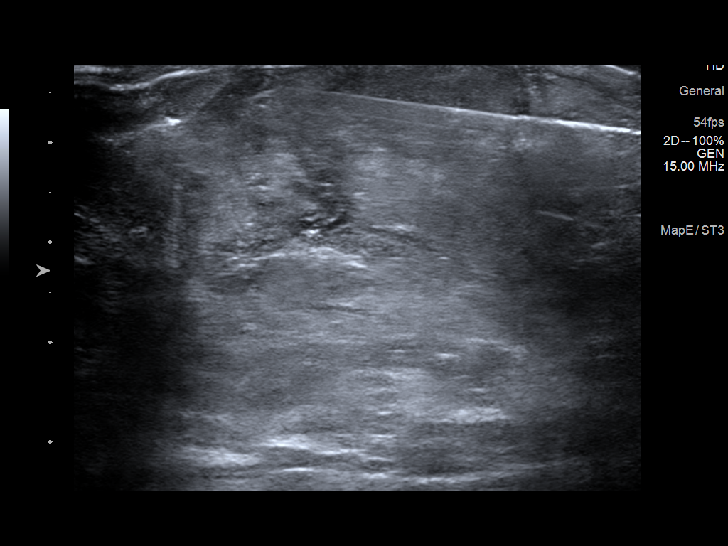
[im 6/11]
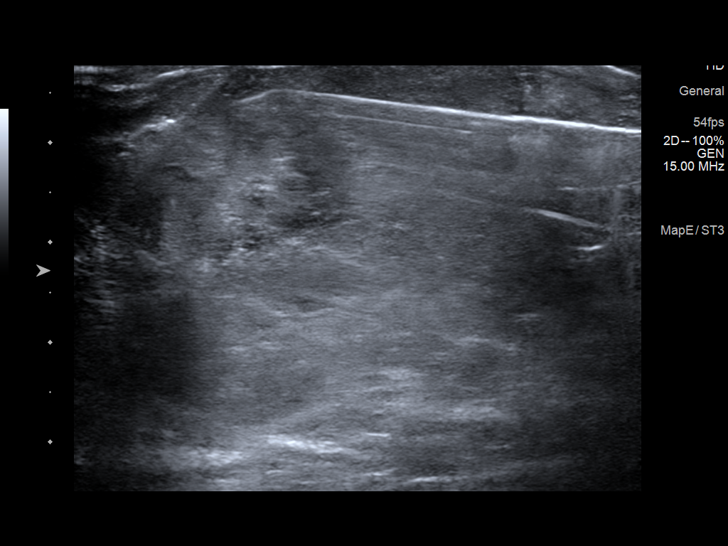
[im 7/11]
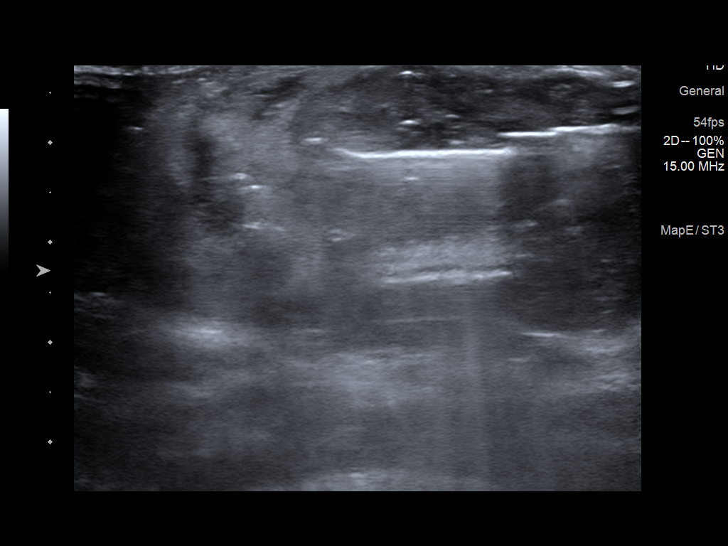
[im 8/11]
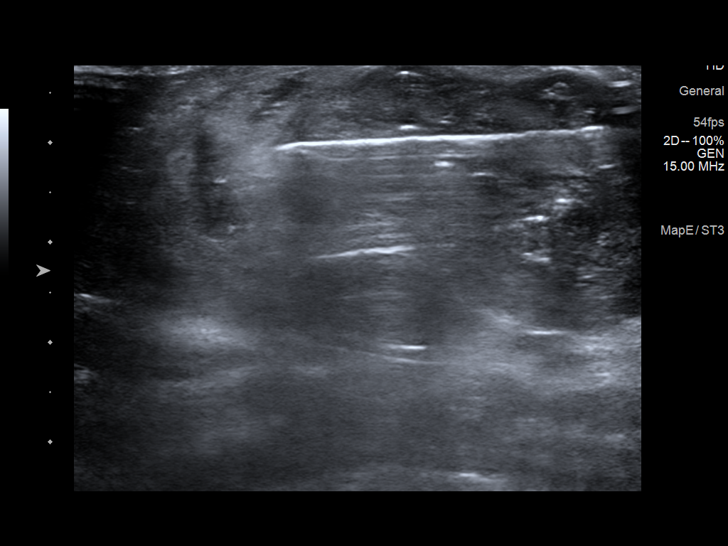
[im 9/11]
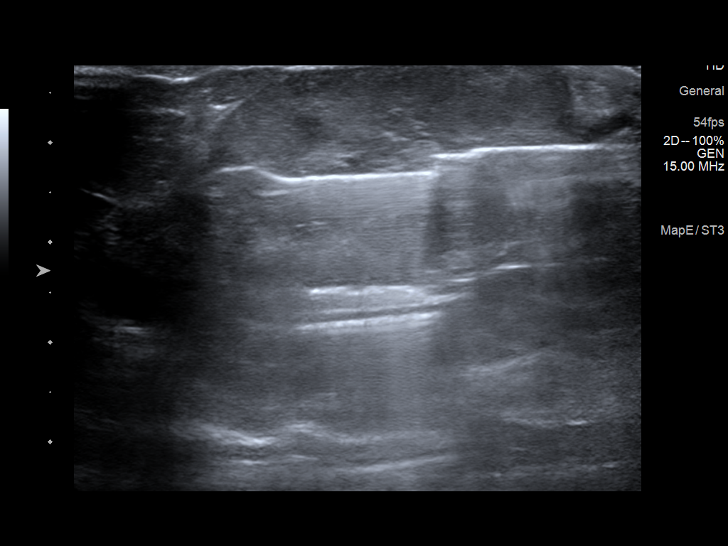
[im 10/11]
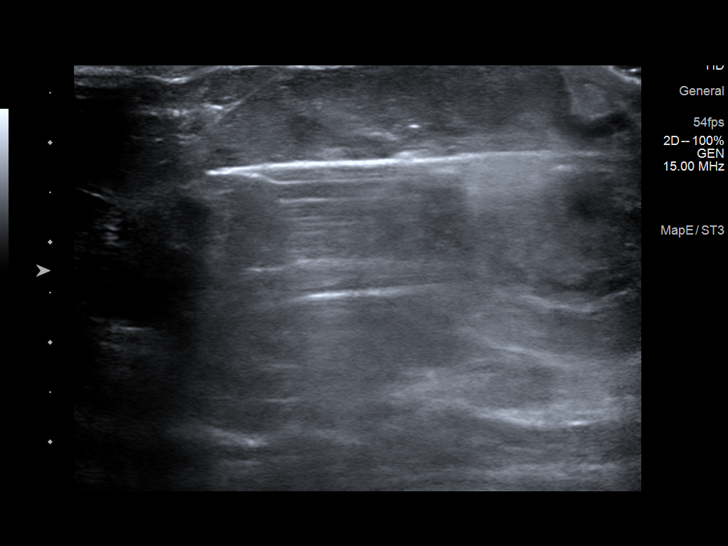
[im 11/11]
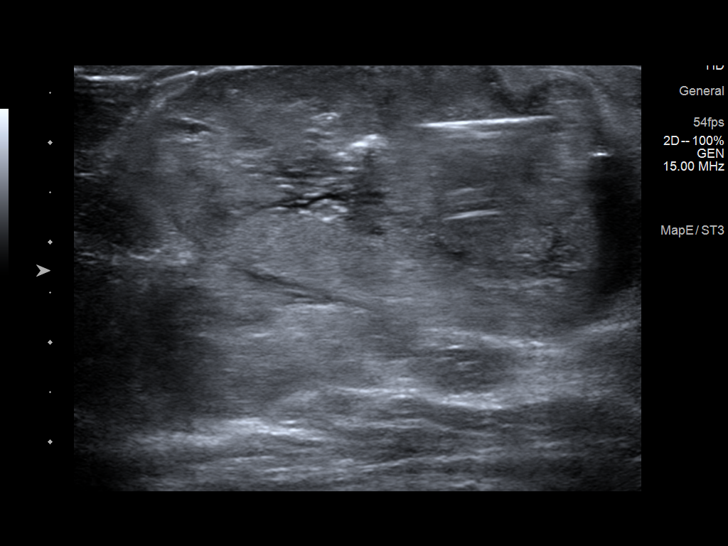

[11 of 11 positions shown; findings below may reference images not displayed]



Lesion quadrant: Upper outer quadrant

Using sterile technique and 1% Lidocaine as local anesthetic, under
direct ultrasound visualization, a 12 gauge [place]el[place] device was
used to perform biopsy of a mass in the left breast at 3 o'clock
using a lateral approach. At the conclusion of the procedure a
ribbon tissue marker clip was deployed into the biopsy cavity.
Follow up 2 view mammogram was performed and dictated separately.
IMPRESSION: Ultrasound guided biopsy of a mass in the left breast at 3 o'clock.
No apparent complications.

ADDENDUM:
Pathology revealed GRADE II INVASIVE DUCTAL CARCINOMA WITH
EXTRACELLULAR MUCIN of the Left breast, 3 o'clock. This was found to
be concordant by Dr. Marbert Nicezo.

Pathology results were discussed with the patient by telephone by
Binte Cismaan RN Nurse Navigator. The patient reported doing well
after the biopsy with tenderness at the site. Post biopsy
instructions and care were reviewed and questions were answered. The
patient was encouraged to call [REDACTED] for any additional concerns.

Surgical consultation has been arranged with Dr. Wylie Meda at
[REDACTED] on September 12, 2020.

Pathology results reported by Calinie Miracl, RN on 09/04/2020.



Lesion quadrant: Upper outer quadrant

Using sterile technique and 1% Lidocaine as local anesthetic, under
direct ultrasound visualization, a 12 gauge Montelongo device was
used to perform biopsy of a mass in the left breast at 3 o'clock
using a lateral approach. At the conclusion of the procedure a
ribbon tissue marker clip was deployed into the biopsy cavity.
Follow up 2 view mammogram was performed and dictated separately.
IMPRESSION: Ultrasound guided biopsy of a mass in the left breast at 3 o'clock.
No apparent complications.

## 2022-10-31 IMAGING — MG MM BREAST LOCALIZATION CLIP
4 series · 4 of 12 positions shown · non-contrast
Comparison: Previous exam(s).

CLINICAL DATA: Post procedure mammogram for clip placement.

EXAM:
DIAGNOSTIC LEFT MAMMOGRAM POST ULTRASOUND BIOPSY

[L ML synth-2D]
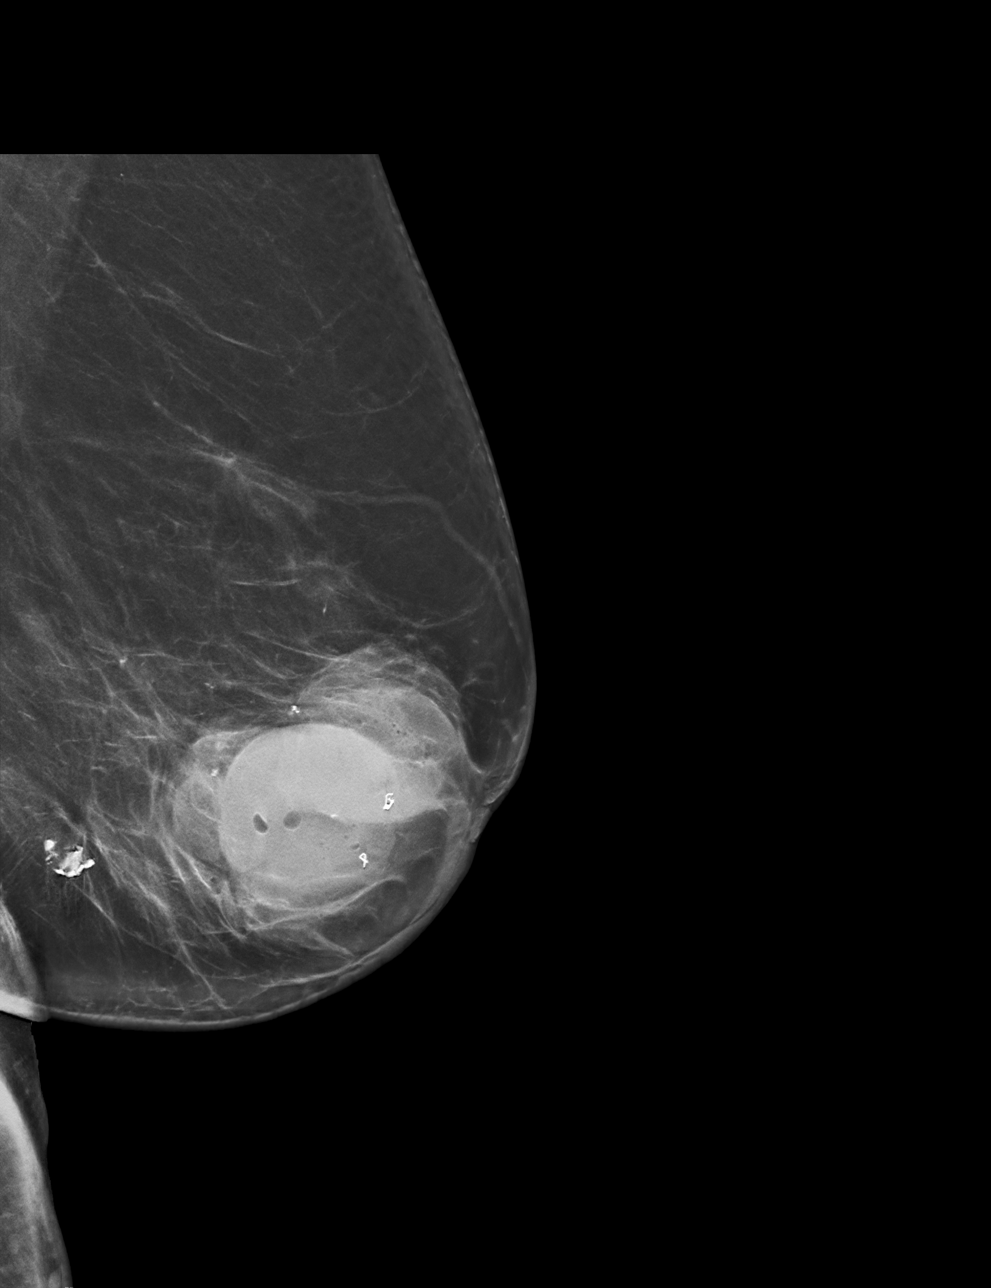

[L CC synth-2D]
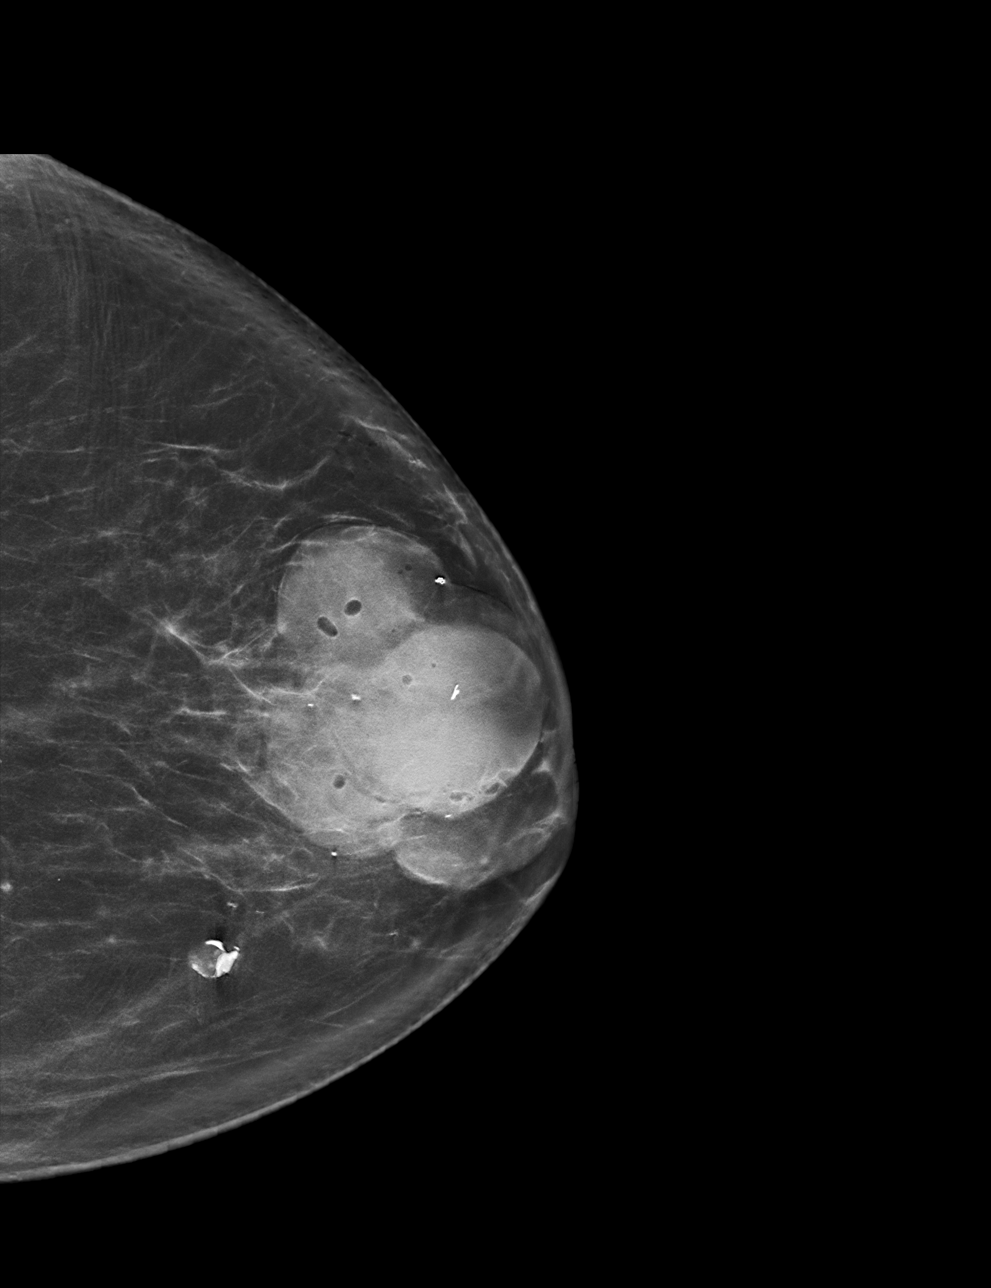

[L ML tomo · tomo slice 43/86.0]
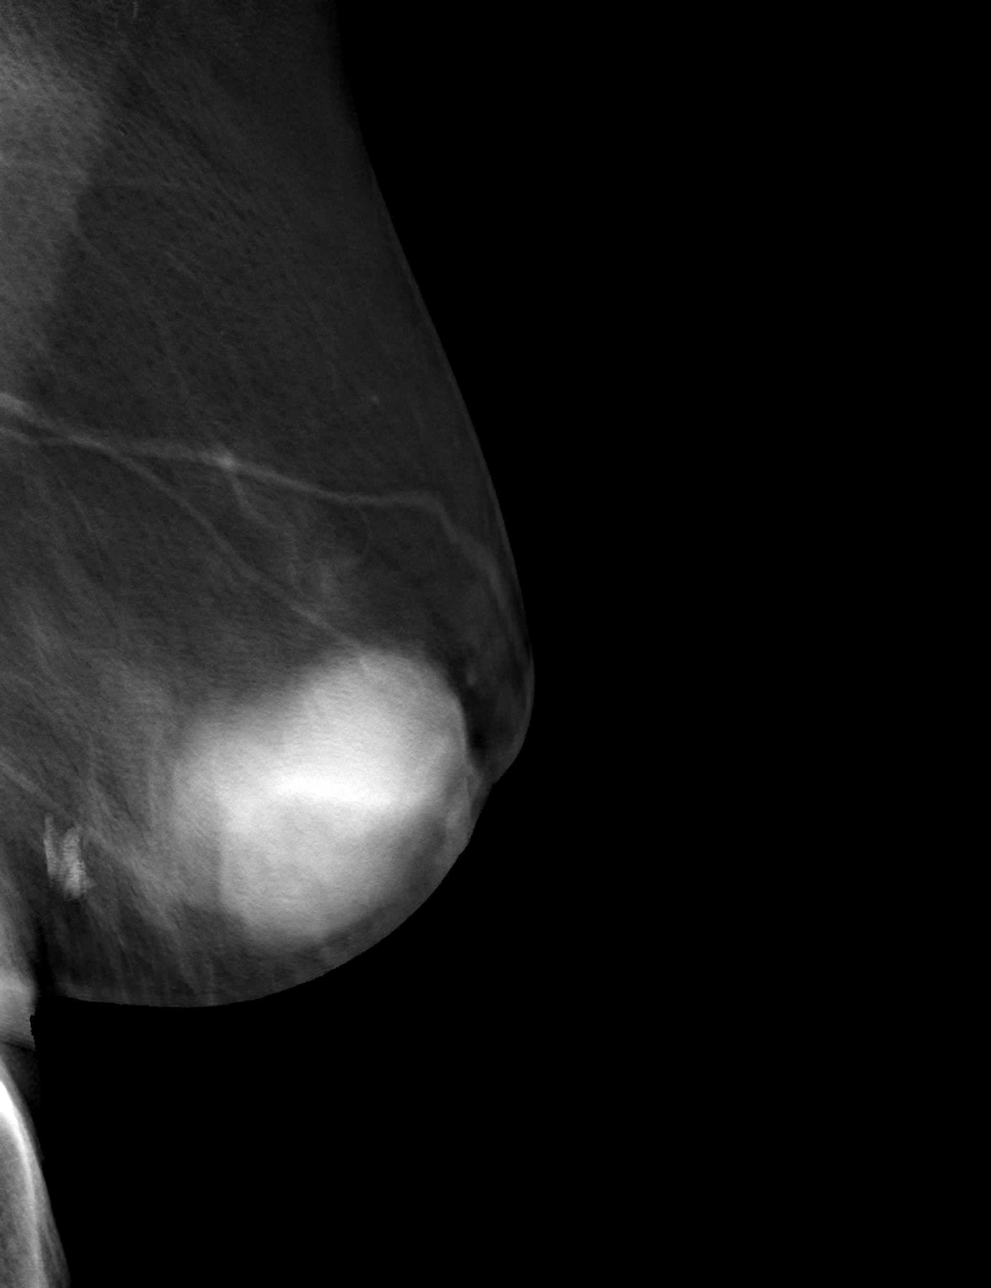

[L CC tomo · tomo slice 43/84.0]
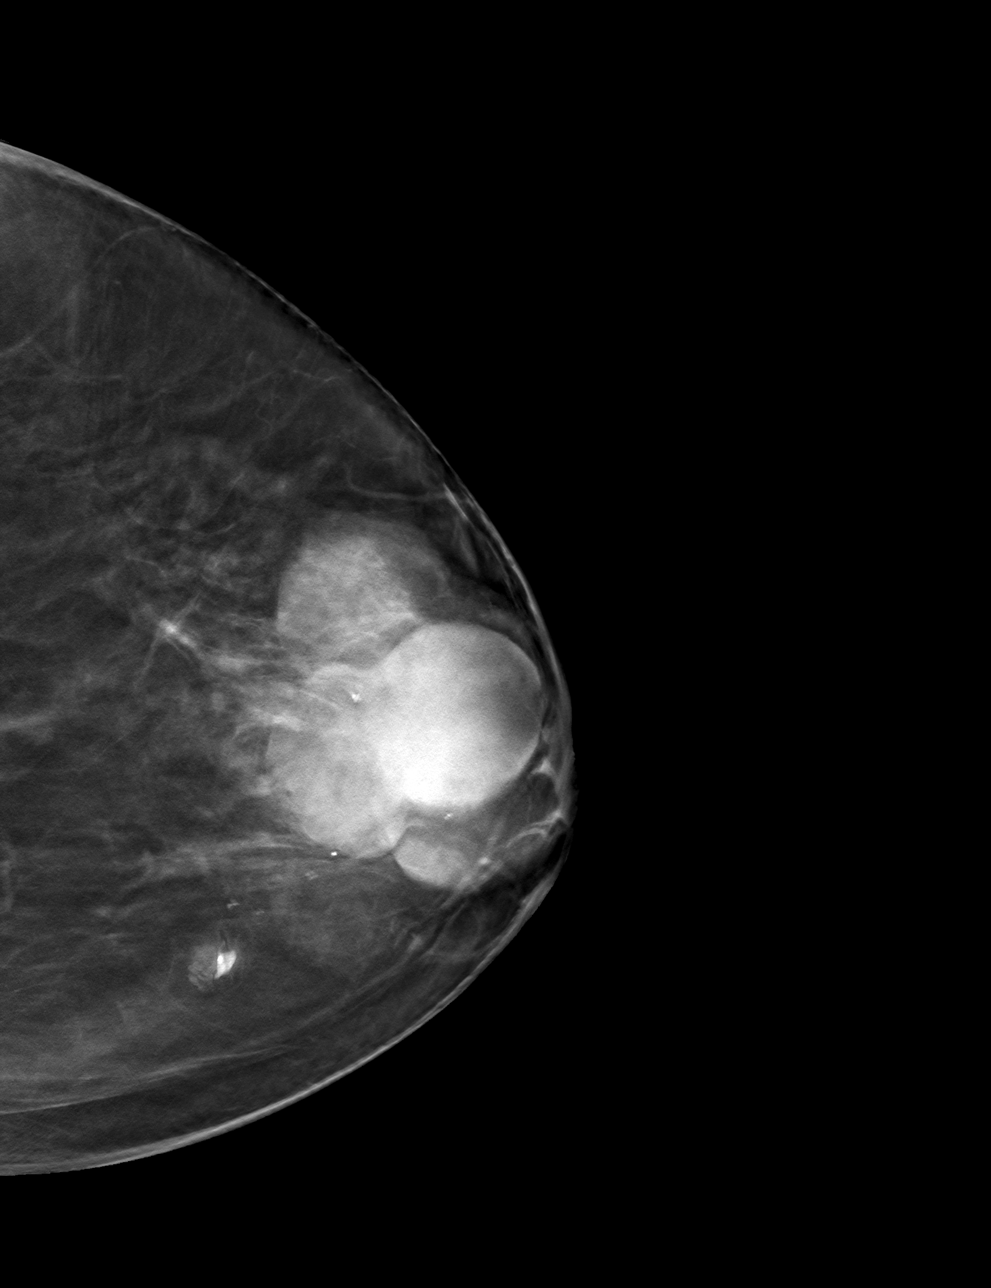

[4 of 12 positions shown; findings below may reference images not displayed]

FINDINGS: Mammographic images were obtained following ultrasound guided biopsy
of a mass in the left breast at 3 o'clock. The biopsy marking clip
is in expected position at the site of biopsy.
IMPRESSION: Appropriate positioning of the ribbon shaped biopsy marking clip at
the site of biopsy in the left breast at 3 o'clock.

Final Assessment: Post Procedure Mammograms for Marker Placement

## 2022-12-14 DIAGNOSIS — L84 Corns and callosities: Secondary | ICD-10-CM | POA: Diagnosis not present

## 2022-12-14 DIAGNOSIS — L603 Nail dystrophy: Secondary | ICD-10-CM | POA: Diagnosis not present

## 2022-12-14 DIAGNOSIS — I739 Peripheral vascular disease, unspecified: Secondary | ICD-10-CM | POA: Diagnosis not present

## 2022-12-25 DIAGNOSIS — R197 Diarrhea, unspecified: Secondary | ICD-10-CM | POA: Diagnosis not present

## 2022-12-25 DIAGNOSIS — I700 Atherosclerosis of aorta: Secondary | ICD-10-CM | POA: Diagnosis not present

## 2022-12-25 DIAGNOSIS — Z170 Estrogen receptor positive status [ER+]: Secondary | ICD-10-CM | POA: Diagnosis not present

## 2022-12-25 DIAGNOSIS — R42 Dizziness and giddiness: Secondary | ICD-10-CM | POA: Diagnosis not present

## 2022-12-25 DIAGNOSIS — C50012 Malignant neoplasm of nipple and areola, left female breast: Secondary | ICD-10-CM | POA: Diagnosis not present

## 2022-12-25 DIAGNOSIS — Z0001 Encounter for general adult medical examination with abnormal findings: Secondary | ICD-10-CM | POA: Diagnosis not present

## 2022-12-25 DIAGNOSIS — K760 Fatty (change of) liver, not elsewhere classified: Secondary | ICD-10-CM | POA: Diagnosis not present

## 2022-12-25 DIAGNOSIS — E876 Hypokalemia: Secondary | ICD-10-CM | POA: Diagnosis not present

## 2022-12-25 DIAGNOSIS — K449 Diaphragmatic hernia without obstruction or gangrene: Secondary | ICD-10-CM | POA: Diagnosis not present

## 2023-01-18 DIAGNOSIS — R197 Diarrhea, unspecified: Secondary | ICD-10-CM | POA: Diagnosis not present

## 2023-01-22 ENCOUNTER — Encounter: Payer: Self-pay | Admitting: Hematology & Oncology

## 2023-01-22 ENCOUNTER — Inpatient Hospital Stay (HOSPITAL_BASED_OUTPATIENT_CLINIC_OR_DEPARTMENT_OTHER): Payer: Medicare Other | Admitting: Hematology & Oncology

## 2023-01-22 ENCOUNTER — Other Ambulatory Visit: Payer: Self-pay

## 2023-01-22 ENCOUNTER — Inpatient Hospital Stay: Payer: Medicare Other | Attending: Hematology & Oncology

## 2023-01-22 VITALS — BP 137/56 | HR 87 | Temp 98.1°F | Resp 18 | Ht 60.0 in | Wt 107.0 lb

## 2023-01-22 DIAGNOSIS — C50912 Malignant neoplasm of unspecified site of left female breast: Secondary | ICD-10-CM | POA: Insufficient documentation

## 2023-01-22 DIAGNOSIS — Z170 Estrogen receptor positive status [ER+]: Secondary | ICD-10-CM | POA: Diagnosis not present

## 2023-01-22 DIAGNOSIS — R197 Diarrhea, unspecified: Secondary | ICD-10-CM | POA: Insufficient documentation

## 2023-01-22 DIAGNOSIS — Z79810 Long term (current) use of selective estrogen receptor modulators (SERMs): Secondary | ICD-10-CM | POA: Insufficient documentation

## 2023-01-22 DIAGNOSIS — C50012 Malignant neoplasm of nipple and areola, left female breast: Secondary | ICD-10-CM

## 2023-01-22 LAB — CBC WITH DIFFERENTIAL (CANCER CENTER ONLY)
Abs Immature Granulocytes: 0.05 10*3/uL (ref 0.00–0.07)
Basophils Absolute: 0.1 10*3/uL (ref 0.0–0.1)
Basophils Relative: 1 %
Eosinophils Absolute: 0.3 10*3/uL (ref 0.0–0.5)
Eosinophils Relative: 2 %
HCT: 41.9 % (ref 36.0–46.0)
Hemoglobin: 13.7 g/dL (ref 12.0–15.0)
Immature Granulocytes: 0 %
Lymphocytes Relative: 23 %
Lymphs Abs: 2.6 10*3/uL (ref 0.7–4.0)
MCH: 30 pg (ref 26.0–34.0)
MCHC: 32.7 g/dL (ref 30.0–36.0)
MCV: 91.9 fL (ref 80.0–100.0)
Monocytes Absolute: 0.8 10*3/uL (ref 0.1–1.0)
Monocytes Relative: 7 %
Neutro Abs: 7.8 10*3/uL — ABNORMAL HIGH (ref 1.7–7.7)
Neutrophils Relative %: 67 %
Platelet Count: 260 10*3/uL (ref 150–400)
RBC: 4.56 MIL/uL (ref 3.87–5.11)
RDW: 12.7 % (ref 11.5–15.5)
WBC Count: 11.6 10*3/uL — ABNORMAL HIGH (ref 4.0–10.5)
nRBC: 0 % (ref 0.0–0.2)

## 2023-01-22 LAB — CMP (CANCER CENTER ONLY)
ALT: 6 U/L (ref 0–44)
AST: 16 U/L (ref 15–41)
Albumin: 4.1 g/dL (ref 3.5–5.0)
Alkaline Phosphatase: 51 U/L (ref 38–126)
Anion gap: 9 (ref 5–15)
BUN: 17 mg/dL (ref 8–23)
CO2: 27 mmol/L (ref 22–32)
Calcium: 9.5 mg/dL (ref 8.9–10.3)
Chloride: 107 mmol/L (ref 98–111)
Creatinine: 0.84 mg/dL (ref 0.44–1.00)
GFR, Estimated: >60 mL/min (ref >60)
Glucose, Bld: 92 mg/dL (ref 70–99)
Potassium: 4.6 mmol/L (ref 3.5–5.1)
Sodium: 143 mmol/L (ref 135–145)
Total Bilirubin: 0.3 mg/dL (ref 0.3–1.2)
Total Protein: 6.9 g/dL (ref 6.5–8.1)

## 2023-01-22 LAB — LACTATE DEHYDROGENASE: LDH: 174 U/L (ref 98–192)

## 2023-01-22 NOTE — Progress Notes (Signed)
Hematology and Oncology Follow Up Visit  NASHAI VANDENBROEK [id number] 1924/11/30 89 y.o. 01/22/2023   Principle Diagnosis:  Stage IIB (T3N0M0) invasive ductal carcinoma of the left breast --ER positive/PR positive/HER2 negative  Stage I (T1b N0 M0) infiltrating ductal carcinoma of the right breast in 2011   Current Therapy:   Status post left modified radical mastectomy -09/30/2020 Tamoxifen 20 mg p.o. daily-start on 10/29/2020   Interim History:  Ms. Lee is here today for follow-up.  It has been 6 months since we last saw her.  She is doing okay.  I think she is living with one of her daughters.  She has had no problems with fatigue or weakness.  She is getting up there in years.  She is 89 years old.  I must give her care that she does look quite good..   She is doing well on the tamoxifen.  She is having quite a bit of diarrhea.  This might be from the tamoxifen.  I told her to stop the tamoxifen for 1 month.  If the diarrhea improves, then I would just have her hold the tamoxifen at that point.    She has had no problems with COVID.Marland Kitchen  She has had no cough.  She has had no rashes.  Overall, I would say that her performance status is probably ECOG 2.   Medications:  Allergies as of 01/22/2023       Reactions   Augmentin [amoxicillin-pot Clavulanate] Other (See Comments)   Develops C Diff   Vicodin [hydrocodone-acetaminophen] Other (See Comments)   Insomnia    Oxycontin [oxycodone Hcl] Other (See Comments)   Unknown reaction   Tylenol [acetaminophen] Nausea And Vomiting   Chills    Ultram [tramadol Hcl] Other (See Comments)   Unknown reaction        Medication List        Accurate as of January 22, 2023 11:39 AM. If you have any questions, ask your nurse or doctor.          ALPRAZolam 0.25 MG tablet Commonly known as: XANAX Take 0.125 mg by mouth daily.   amLODipine 2.5 MG tablet Commonly known as: NORVASC Take 2.5 mg by mouth daily.   CO Q 10  PO Take 1 capsule by mouth daily.   FISH OIL PO Take 1 capsule by mouth daily.   hydrochlorothiazide 25 MG tablet Commonly known as: HYDRODIURIL Take 1 tablet (25 mg total) by mouth daily.   KLOR-CON PO Take 20 mEq by mouth 2 (two) times daily.   loperamide 2 MG tablet Commonly known as: Imodium A-D Take 1 tablet (2 mg total) by mouth 3 (three) times daily as needed for diarrhea or loose stools.   lovastatin 40 MG tablet Commonly known as: MEVACOR Take 40 mg by mouth daily.   MULTIVITAMIN PO Take 1 tablet by mouth daily.   naproxen sodium 220 MG tablet Commonly known as: ALEVE Take 220 mg by mouth daily as needed (pain).   ondansetron 4 MG tablet Commonly known as: Zofran Take 1 tablet (4 mg total) by mouth every 8 (eight) hours as needed for nausea or vomiting.   tamoxifen 20 MG tablet Commonly known as: NOLVADEX Take 1 tablet (20 mg total) by mouth daily.   VITAMIN D-3 PO Take 1 capsule by mouth daily.        Allergies:  Allergies  Allergen Reactions   Augmentin [Amoxicillin-Pot Clavulanate] Other (See Comments)    Develops C Diff   Vicodin [Hydrocodone-Acetaminophen]  Other (See Comments)    Insomnia    Oxycontin [Oxycodone Hcl] Other (See Comments)    Unknown reaction   Tylenol [Acetaminophen] Nausea And Vomiting    Chills    Ultram [Tramadol Hcl] Other (See Comments)    Unknown reaction    Past Medical History, Surgical history, Social history, and Family History were reviewed and updated.  Review of Systems: Review of Systems  Constitutional: Negative.   HENT: Negative.    Eyes: Negative.   Respiratory: Negative.    Cardiovascular: Negative.   Gastrointestinal: Negative.   Genitourinary: Negative.   Musculoskeletal: Negative.   Skin: Negative.   Neurological: Negative.   Endo/Heme/Allergies: Negative.   Psychiatric/Behavioral: Negative.       Physical Exam:  height is 5' (1.524 m) and weight is 107 lb (48.5 kg). Her oral temperature  is 98.1 F (36.7 C). Her blood pressure is 137/56 (abnormal) and her pulse is 87. Her respiration is 18 and oxygen saturation is 100%.   Wt Readings from Last 3 Encounters:  01/22/23 107 lb (48.5 kg)  10/01/22 111 lb 12.4 oz (50.7 kg)  06/12/22 111 lb 12.8 oz (50.7 kg)    Physical Exam Vitals reviewed.  Constitutional:      Comments: Her chest wall exam shows the left chest wall with the healing modified radical mastectomy scar.  She has no erythema or warmth.  She has no nodularity.  There is no left axillary adenopathy.  Right breast shows the lumpectomy which is well-healed at about the 3 o'clock position.  There is no right axillary adenopathy.  HENT:     Head: Normocephalic and atraumatic.  Eyes:     Pupils: Pupils are equal, round, and reactive to light.  Cardiovascular:     Rate and Rhythm: Normal rate and regular rhythm.     Heart sounds: Normal heart sounds.  Pulmonary:     Effort: Pulmonary effort is normal.     Breath sounds: Normal breath sounds.  Abdominal:     General: Bowel sounds are normal.     Palpations: Abdomen is soft.  Musculoskeletal:        General: No tenderness or deformity. Normal range of motion.     Cervical back: Normal range of motion.  Lymphadenopathy:     Cervical: No cervical adenopathy.  Skin:    General: Skin is warm and dry.     Findings: No erythema or rash.  Neurological:     Mental Status: She is alert and oriented to person, place, and time.  Psychiatric:        Behavior: Behavior normal.        Thought Content: Thought content normal.        Judgment: Judgment normal.       Lab Results  Component Value Date   WBC 11.6 (H) 01/22/2023   HGB 13.7 01/22/2023   HCT 41.9 01/22/2023   MCV 91.9 01/22/2023   PLT 260 01/22/2023   No results found for: "FERRITIN", "IRON", "TIBC", "UIBC", "IRONPCTSAT" Lab Results  Component Value Date   RBC 4.56 01/22/2023   No results found for: "KPAFRELGTCHN", "LAMBDASER", "KAPLAMBRATIO" No  results found for: "IGGSERUM", "IGA", "IGMSERUM" No results found for: "TOTALPROTELP", "ALBUMINELP", "A1GS", "A2GS", "BETS", "BETA2SER", "GAMS", "MSPIKE", "SPEI"   Chemistry      Component Value Date/Time   NA 143 01/22/2023 1031   NA 146 (H) 02/15/2017 1008   NA 142 08/07/2016 0858   K 4.6 01/22/2023 1031   K 3.2 (L) 02/15/2017  1008   K 3.3 (L) 08/07/2016 0858   CL 107 01/22/2023 1031   CL 105 02/15/2017 1008   CO2 27 01/22/2023 1031   CO2 31 02/15/2017 1008   CO2 29 08/07/2016 0858   BUN 17 01/22/2023 1031   BUN 15 02/15/2017 1008   BUN 23.5 08/07/2016 0858   CREATININE 0.84 01/22/2023 1031   CREATININE 1.0 02/15/2017 1008   CREATININE 1.0 08/07/2016 0858      Component Value Date/Time   CALCIUM 9.5 01/22/2023 1031   CALCIUM 9.8 02/15/2017 1008   CALCIUM 10.0 08/07/2016 0858   ALKPHOS 51 01/22/2023 1031   ALKPHOS 80 02/15/2017 1008   ALKPHOS 84 08/07/2016 0858   AST 16 01/22/2023 1031   AST 24 08/07/2016 0858   ALT 6 01/22/2023 1031   ALT 20 02/15/2017 1008   ALT 14 08/07/2016 0858   BILITOT 0.3 01/22/2023 1031   BILITOT 0.50 08/07/2016 0858       Impression and Plan: Ms. Rodine is a very pleasant 89 yo caucasian female with history of stage I ductal carcinoma of the right breast diagnosed in September 2011.   She had lumpectomy and radiation therapy.  We tried her on an aromatase inhibitor and she tolerated these poorly.  She then developed a new breast cancer on the left side.  She had a modified left radical mastectomy.  She had a large tumor.  Thankfully it had good prognostic markers.  She had a mastectomy on 09/30/2020.  We will hold the tamoxifen for right now.  Again we will see if the diarrhea improves.  This is all about quality of life.    For right now, I think we can plan to get her back in 6 months now.  I am just glad that her quality of life certainly is doing nicely.   Josph Macho, MD 9/13/202411:39 AM

## 2023-02-04 DIAGNOSIS — R197 Diarrhea, unspecified: Secondary | ICD-10-CM | POA: Diagnosis not present

## 2023-02-21 ENCOUNTER — Other Ambulatory Visit: Payer: Self-pay | Admitting: Hematology & Oncology

## 2023-02-26 DIAGNOSIS — H353132 Nonexudative age-related macular degeneration, bilateral, intermediate dry stage: Secondary | ICD-10-CM | POA: Diagnosis not present

## 2023-03-30 DIAGNOSIS — R197 Diarrhea, unspecified: Secondary | ICD-10-CM | POA: Diagnosis not present

## 2023-03-30 DIAGNOSIS — R2689 Other abnormalities of gait and mobility: Secondary | ICD-10-CM | POA: Diagnosis not present

## 2023-03-30 DIAGNOSIS — Z9181 History of falling: Secondary | ICD-10-CM | POA: Diagnosis not present

## 2023-04-01 DIAGNOSIS — Z23 Encounter for immunization: Secondary | ICD-10-CM | POA: Diagnosis not present

## 2023-04-12 ENCOUNTER — Inpatient Hospital Stay (HOSPITAL_COMMUNITY)
Admission: EM | Admit: 2023-04-12 | Discharge: 2023-04-20 | DRG: 641 | Disposition: A | Discharge status: Skilled Nursing Facility | Payer: Medicare Other | Attending: Family Medicine | Admitting: Family Medicine

## 2023-04-12 ENCOUNTER — Emergency Department (HOSPITAL_COMMUNITY): Payer: Medicare Other

## 2023-04-12 ENCOUNTER — Other Ambulatory Visit: Payer: Self-pay

## 2023-04-12 ENCOUNTER — Encounter (HOSPITAL_COMMUNITY): Payer: Self-pay

## 2023-04-12 DIAGNOSIS — R32 Unspecified urinary incontinence: Secondary | ICD-10-CM | POA: Diagnosis not present

## 2023-04-12 DIAGNOSIS — Z888 Allergy status to other drugs, medicaments and biological substances status: Secondary | ICD-10-CM

## 2023-04-12 DIAGNOSIS — R9089 Other abnormal findings on diagnostic imaging of central nervous system: Secondary | ICD-10-CM | POA: Diagnosis not present

## 2023-04-12 DIAGNOSIS — Z885 Allergy status to narcotic agent status: Secondary | ICD-10-CM

## 2023-04-12 DIAGNOSIS — D72829 Elevated white blood cell count, unspecified: Secondary | ICD-10-CM | POA: Diagnosis not present

## 2023-04-12 DIAGNOSIS — E43 Unspecified severe protein-calorie malnutrition: Secondary | ICD-10-CM | POA: Diagnosis not present

## 2023-04-12 DIAGNOSIS — N179 Acute kidney failure, unspecified: Secondary | ICD-10-CM | POA: Diagnosis present

## 2023-04-12 DIAGNOSIS — Z79899 Other long term (current) drug therapy: Secondary | ICD-10-CM | POA: Diagnosis not present

## 2023-04-12 DIAGNOSIS — Z880 Allergy status to penicillin: Secondary | ICD-10-CM

## 2023-04-12 DIAGNOSIS — R531 Weakness: Secondary | ICD-10-CM | POA: Diagnosis not present

## 2023-04-12 DIAGNOSIS — Z90710 Acquired absence of both cervix and uterus: Secondary | ICD-10-CM | POA: Diagnosis not present

## 2023-04-12 DIAGNOSIS — I700 Atherosclerosis of aorta: Secondary | ICD-10-CM | POA: Diagnosis not present

## 2023-04-12 DIAGNOSIS — R2681 Unsteadiness on feet: Secondary | ICD-10-CM | POA: Diagnosis not present

## 2023-04-12 DIAGNOSIS — N2889 Other specified disorders of kidney and ureter: Secondary | ICD-10-CM | POA: Diagnosis not present

## 2023-04-12 DIAGNOSIS — M48061 Spinal stenosis, lumbar region without neurogenic claudication: Secondary | ICD-10-CM | POA: Diagnosis not present

## 2023-04-12 DIAGNOSIS — G9340 Encephalopathy, unspecified: Secondary | ICD-10-CM | POA: Diagnosis not present

## 2023-04-12 DIAGNOSIS — R339 Retention of urine, unspecified: Secondary | ICD-10-CM | POA: Diagnosis present

## 2023-04-12 DIAGNOSIS — Z7401 Bed confinement status: Secondary | ICD-10-CM | POA: Diagnosis not present

## 2023-04-12 DIAGNOSIS — I10 Essential (primary) hypertension: Secondary | ICD-10-CM | POA: Diagnosis present

## 2023-04-12 DIAGNOSIS — M810 Age-related osteoporosis without current pathological fracture: Secondary | ICD-10-CM | POA: Diagnosis present

## 2023-04-12 DIAGNOSIS — R262 Difficulty in walking, not elsewhere classified: Secondary | ICD-10-CM | POA: Diagnosis present

## 2023-04-12 DIAGNOSIS — Z1152 Encounter for screening for COVID-19: Secondary | ICD-10-CM

## 2023-04-12 DIAGNOSIS — R110 Nausea: Secondary | ICD-10-CM | POA: Diagnosis not present

## 2023-04-12 DIAGNOSIS — M5117 Intervertebral disc disorders with radiculopathy, lumbosacral region: Secondary | ICD-10-CM | POA: Diagnosis not present

## 2023-04-12 DIAGNOSIS — Z9012 Acquired absence of left breast and nipple: Secondary | ICD-10-CM | POA: Diagnosis not present

## 2023-04-12 DIAGNOSIS — E785 Hyperlipidemia, unspecified: Secondary | ICD-10-CM | POA: Diagnosis present

## 2023-04-12 DIAGNOSIS — I6782 Cerebral ischemia: Secondary | ICD-10-CM | POA: Diagnosis not present

## 2023-04-12 DIAGNOSIS — L89890 Pressure ulcer of other site, unstageable: Secondary | ICD-10-CM | POA: Diagnosis not present

## 2023-04-12 DIAGNOSIS — Z6822 Body mass index (BMI) 22.0-22.9, adult: Secondary | ICD-10-CM

## 2023-04-12 DIAGNOSIS — Z751 Person awaiting admission to adequate facility elsewhere: Secondary | ICD-10-CM | POA: Diagnosis not present

## 2023-04-12 DIAGNOSIS — R4189 Other symptoms and signs involving cognitive functions and awareness: Secondary | ICD-10-CM | POA: Diagnosis present

## 2023-04-12 DIAGNOSIS — M1990 Unspecified osteoarthritis, unspecified site: Secondary | ICD-10-CM | POA: Diagnosis not present

## 2023-04-12 DIAGNOSIS — Z853 Personal history of malignant neoplasm of breast: Secondary | ICD-10-CM | POA: Diagnosis not present

## 2023-04-12 DIAGNOSIS — E876 Hypokalemia: Secondary | ICD-10-CM | POA: Diagnosis present

## 2023-04-12 DIAGNOSIS — F419 Anxiety disorder, unspecified: Secondary | ICD-10-CM | POA: Diagnosis not present

## 2023-04-12 DIAGNOSIS — K219 Gastro-esophageal reflux disease without esophagitis: Secondary | ICD-10-CM | POA: Diagnosis not present

## 2023-04-12 DIAGNOSIS — F03918 Unspecified dementia, unspecified severity, with other behavioral disturbance: Secondary | ICD-10-CM | POA: Diagnosis present

## 2023-04-12 DIAGNOSIS — G3289 Other specified degenerative disorders of nervous system in diseases classified elsewhere: Secondary | ICD-10-CM | POA: Diagnosis present

## 2023-04-12 DIAGNOSIS — R5381 Other malaise: Secondary | ICD-10-CM | POA: Diagnosis not present

## 2023-04-12 DIAGNOSIS — R059 Cough, unspecified: Secondary | ICD-10-CM | POA: Diagnosis not present

## 2023-04-12 DIAGNOSIS — M4726 Other spondylosis with radiculopathy, lumbar region: Secondary | ICD-10-CM | POA: Diagnosis not present

## 2023-04-12 LAB — COMPREHENSIVE METABOLIC PANEL
ALT: 11 U/L (ref 0–44)
AST: 22 U/L (ref 15–41)
Albumin: 3.8 g/dL (ref 3.5–5.0)
Alkaline Phosphatase: 90 U/L (ref 38–126)
Anion gap: 8 (ref 5–15)
BUN: 17 mg/dL (ref 8–23)
CO2: 23 mmol/L (ref 22–32)
Calcium: 9.3 mg/dL (ref 8.9–10.3)
Chloride: 109 mmol/L (ref 98–111)
Creatinine, Ser: 0.9 mg/dL (ref 0.44–1.00)
GFR, Estimated: 59 mL/min — ABNORMAL LOW (ref >60)
Glucose, Bld: 109 mg/dL — ABNORMAL HIGH (ref 70–99)
Potassium: 3.7 mmol/L (ref 3.5–5.1)
Sodium: 140 mmol/L (ref 135–145)
Total Bilirubin: 0.6 mg/dL (ref <1.2)
Total Protein: 6.8 g/dL (ref 6.5–8.1)

## 2023-04-12 LAB — URINALYSIS, ROUTINE W REFLEX MICROSCOPIC
Bilirubin Urine: NEGATIVE
Glucose, UA: NEGATIVE mg/dL
Hgb urine dipstick: NEGATIVE
Ketones, ur: NEGATIVE mg/dL
Leukocytes,Ua: NEGATIVE
Nitrite: NEGATIVE
Protein, ur: NEGATIVE mg/dL
Specific Gravity, Urine: 1.018 (ref 1.005–1.030)
pH: 5 (ref 5.0–8.0)

## 2023-04-12 LAB — CBC
HCT: 41.8 % (ref 36.0–46.0)
Hemoglobin: 13.3 g/dL (ref 12.0–15.0)
MCH: 29.4 pg (ref 26.0–34.0)
MCHC: 31.8 g/dL (ref 30.0–36.0)
MCV: 92.3 fL (ref 80.0–100.0)
Platelets: 228 10*3/uL (ref 150–400)
RBC: 4.53 MIL/uL (ref 3.87–5.11)
RDW: 13 % (ref 11.5–15.5)
WBC: 12.6 10*3/uL — ABNORMAL HIGH (ref 4.0–10.5)
nRBC: 0 % (ref 0.0–0.2)

## 2023-04-12 LAB — RESP PANEL BY RT-PCR (RSV, FLU A&B, COVID)  RVPGX2
Influenza A by PCR: NEGATIVE
Influenza B by PCR: NEGATIVE
Resp Syncytial Virus by PCR: NEGATIVE
SARS Coronavirus 2 by RT PCR: NEGATIVE

## 2023-04-12 LAB — CBG MONITORING, ED: Glucose-Capillary: 103 mg/dL — ABNORMAL HIGH (ref 70–99)

## 2023-04-12 MED ORDER — LORAZEPAM 1 MG PO TABS
1.0000 mg | ORAL_TABLET | Freq: Once | ORAL | Status: AC
  Administered 2023-04-12: 1 mg via ORAL
  Filled 2023-04-12: qty 1

## 2023-04-12 MED ORDER — AMLODIPINE BESYLATE 5 MG PO TABS: 2.5000 mg | ORAL_TABLET | Freq: Every day | ORAL | Status: DC

## 2023-04-12 MED ORDER — PRAVASTATIN SODIUM 40 MG PO TABS
40.0000 mg | ORAL_TABLET | Freq: Every day | ORAL | Status: DC
  Administered 2023-04-13 – 2023-04-16 (×4): 40 mg via ORAL
  Filled 2023-04-12 (×5): qty 1

## 2023-04-12 MED ORDER — ENOXAPARIN SODIUM 40 MG/0.4ML IJ SOSY
40.0000 mg | PREFILLED_SYRINGE | INTRAMUSCULAR | Status: DC
  Administered 2023-04-13: 40 mg via SUBCUTANEOUS
  Filled 2023-04-12: qty 0.4

## 2023-04-12 MED ORDER — TAMOXIFEN CITRATE 20 MG PO TABS: 20.0000 mg | ORAL_TABLET | Freq: Every day | ORAL | Status: DC

## 2023-04-12 MED ORDER — ONDANSETRON HCL 4 MG/2ML IJ SOLN
4.0000 mg | Freq: Four times a day (QID) | INTRAMUSCULAR | Status: DC | PRN
  Administered 2023-04-13 – 2023-04-16 (×4): 4 mg via INTRAVENOUS
  Filled 2023-04-12 (×4): qty 2

## 2023-04-12 MED ORDER — ONDANSETRON HCL 4 MG PO TABS
4.0000 mg | ORAL_TABLET | Freq: Four times a day (QID) | ORAL | Status: DC | PRN
  Filled 2023-04-12: qty 1

## 2023-04-12 MED ORDER — HYDROCHLOROTHIAZIDE 25 MG PO TABS: 25.0000 mg | ORAL_TABLET | Freq: Every day | ORAL | Status: DC

## 2023-04-12 NOTE — ED Notes (Signed)
Pt placed on bedpan

## 2023-04-12 NOTE — ED Notes (Addendum)
Pt was placed in wheelchair by family bc the Pt needed to go to the bathroom.  Staff was in the middle of shift change and oncoming paramedic was receiving report.  Family noted that the Pt needed to go to the bathroom and that someone needed to help the Pt as she was not able to stand.  Staff told the Pt's family that the bedpan would be the best option.  Family was not happy with this and insisted that staff help the Pt to the bathroom.  NT helped the Pt go to the bathroom.

## 2023-04-12 NOTE — ED Notes (Signed)
ED TO INPATIENT HANDOFF REPORT  ED Nurse Name and Phone #: Dahlia Client [phone]  S Name/Age/Gender Nancy Bell 89 y.o. female Room/Bed: 006C/006C  Code Status   Code Status: Full Code  Home/SNF/Other Home Patient oriented to: self, place, time, and situation Is this baseline? Yes   Triage Complete: Triage complete  Chief Complaint Weakness [R53.1]  Triage Note Pt to ED accompanied with daughter c/o increased weakness x a couple months. Pt daughter concerned today because when pt woke up reports she could not walk. Pt able to walk in triage with minimal assist. Pt also reports poor sleep last night, shakes noted, pt reports this is normal for her.   Called EMS this morning when pt reported couldn't walk, went to [facility] , but left prior to being seen.   Pt c/o sore throat that started today    Allergies Allergies  Allergen Reactions   Augmentin [Amoxicillin-Pot Clavulanate] Other (See Comments)    Develops C Diff   Vicodin [Hydrocodone-Acetaminophen] Other (See Comments)    Insomnia    Oxycontin [Oxycodone Hcl] Other (See Comments)    Unknown reaction   Tylenol [Acetaminophen] Nausea And Vomiting    Chills    Ultram [Tramadol Hcl] Other (See Comments)    Unknown reaction    Level of Care/Admitting Diagnosis ED Disposition     ED Disposition  Admit   Condition  --   Comment  Hospital Area: [facility] [[id number]]  Level of Care: Telemetry Medical [104]  May admit patient to [facility] or [facility] if equivalent level of care is available:: No  Covid Evaluation: Asymptomatic - no recent exposure (last 10 days) testing not required  Diagnosis: Weakness [241835]  Admitting Physician: Alan Mulder [[id number]]  Attending Physician: Alan Mulder [[id number]]  Certification:: I certify this patient will need inpatient services for at least 2 midnights  Expected Medical Readiness: 04/15/2023          B Medical/Surgery History Past  Medical History:  Diagnosis Date   Anxiety    Cancer (HCC)    right breast   History of hiatal hernia    Hyperlipidemia    Hypertension    Past Surgical History:  Procedure Laterality Date   BREAST BIOPSY  2 weeks ago   BREAST LUMPECTOMY  01/29/2010   Right- Dr Jamey Ripa   TOOTH EXTRACTION     TOTAL MASTECTOMY Left 09/30/2020   Procedure: LEFT TOTAL MASTECTOMY;  Surgeon: Griselda Miner, MD;  Location: [facility] OR;  Service: General;  Laterality: Left;     A IV Location/Drains/Wounds Patient Lines/Drains/Airways Status     Active Line/Drains/Airways     Name Placement date Placement time Site Days   Closed System Drain 1 Left Breast Bulb (JP) 19 Fr. 09/30/20  1301  Breast  924            Intake/Output Last 24 hours No intake or output data in the 24 hours ending 04/12/23 2120  Labs/Imaging Results for orders placed or performed during the hospital encounter of 04/12/23 (from the past 48 hour(s))  Resp panel by RT-PCR (RSV, Flu A&B, Covid) Anterior Nasal Swab     Status: None   Collection Time: 04/12/23 11:55 AM   Specimen: Anterior Nasal Swab  Result Value Ref Range   SARS Coronavirus 2 by RT PCR NEGATIVE NEGATIVE   Influenza A by PCR NEGATIVE NEGATIVE   Influenza B by PCR NEGATIVE NEGATIVE    Comment: (NOTE) The Xpert Xpress SARS-CoV-2/FLU/RSV plus assay  is intended as an aid in the diagnosis of influenza from Nasopharyngeal swab specimens and should not be used as a sole basis for treatment. Nasal washings and aspirates are unacceptable for Xpert Xpress SARS-CoV-2/FLU/RSV testing.  Fact Sheet for Patients: BloggerCourse.com  Fact Sheet for Healthcare Providers: SeriousBroker.it  This test is not yet approved or cleared by the [place] FDA and has been authorized for detection and/or diagnosis of SARS-CoV-2 by FDA under an Emergency Use Authorization (EUA). This EUA will remain in effect (meaning this test can be  used) for the duration of the COVID-19 declaration under Section 564(b)(1) of the Act, 21 U.S.C. section 360bbb-3(b)(1), unless the authorization is terminated or revoked.     Resp Syncytial Virus by PCR NEGATIVE NEGATIVE    Comment: (NOTE) Fact Sheet for Patients: BloggerCourse.com  Fact Sheet for Healthcare Providers: SeriousBroker.it  This test is not yet approved or cleared by the [place] FDA and has been authorized for detection and/or diagnosis of SARS-CoV-2 by FDA under an Emergency Use Authorization (EUA). This EUA will remain in effect (meaning this test can be used) for the duration of the COVID-19 declaration under Section 564(b)(1) of the Act, 21 U.S.C. section 360bbb-3(b)(1), unless the authorization is terminated or revoked.  Performed at [facility], [address], [place], [place] [address]   CBC     Status: Abnormal   Collection Time: 04/12/23 12:19 PM  Result Value Ref Range   WBC 12.6 (H) 4.0 - 10.5 K/uL   RBC 4.53 3.87 - 5.11 MIL/uL   Hemoglobin 13.3 12.0 - 15.0 g/dL   HCT 53.6 64.4 - 03.4 %   MCV 92.3 80.0 - 100.0 fL   MCH 29.4 26.0 - 34.0 pg   MCHC 31.8 30.0 - 36.0 g/dL   RDW 74.2 59.5 - 63.8 %   Platelets 228 150 - 400 K/uL   nRBC 0.0 0.0 - 0.2 %    Comment: Performed at [facility], [address], [place], [place] [address]  CBG monitoring, ED     Status: Abnormal   Collection Time: 04/12/23 12:19 PM  Result Value Ref Range   Glucose-Capillary 103 (H) 70 - 99 mg/dL    Comment: Glucose reference range applies only to samples taken after fasting for at least 8 hours.  Comprehensive metabolic panel     Status: Abnormal   Collection Time: 04/12/23 12:19 PM  Result Value Ref Range   Sodium 140 135 - 145 mmol/L   Potassium 3.7 3.5 - 5.1 mmol/L   Chloride 109 98 - 111 mmol/L   CO2 23 22 - 32 mmol/L   Glucose, Bld 109 (H) 70 - 99 mg/dL    Comment: Glucose reference range applies  only to samples taken after fasting for at least 8 hours.   BUN 17 8 - 23 mg/dL   Creatinine, Ser 3.29 0.44 - 1.00 mg/dL   Calcium 9.3 8.9 - 51.8 mg/dL   Total Protein 6.8 6.5 - 8.1 g/dL   Albumin 3.8 3.5 - 5.0 g/dL   AST 22 15 - 41 U/L   ALT 11 0 - 44 U/L   Alkaline Phosphatase 90 38 - 126 U/L   Total Bilirubin 0.6 <1.2 mg/dL   GFR, Estimated 59 (L) >60 mL/min    Comment: (NOTE) Calculated using the CKD-EPI Creatinine Equation (2021)    Anion gap 8 5 - 15    Comment: Performed at [facility], [address], [place], [place]  [address]  Urinalysis, Routine w reflex microscopic -Urine, Clean Catch     Status: Abnormal   Collection Time: 04/12/23  3:39 PM  Result Value Ref Range   Color, Urine YELLOW YELLOW   APPearance HAZY (A) CLEAR   Specific Gravity, Urine 1.018 1.005 - 1.030   pH 5.0 5.0 - 8.0   Glucose, UA NEGATIVE NEGATIVE mg/dL   Hgb urine dipstick NEGATIVE NEGATIVE   Bilirubin Urine NEGATIVE NEGATIVE   Ketones, ur NEGATIVE NEGATIVE mg/dL   Protein, ur NEGATIVE NEGATIVE mg/dL   Nitrite NEGATIVE NEGATIVE   Leukocytes,Ua NEGATIVE NEGATIVE    Comment: Performed at [facility], [address], [place], [place] [address]   MR BRAIN WO CONTRAST  Result Date: 04/12/2023 CLINICAL DATA:  Stroke suspected EXAM: MRI HEAD WITHOUT CONTRAST TECHNIQUE: Multiplanar, multiecho pulse sequences of the brain and surrounding structures were obtained without intravenous contrast. COMPARISON:  None Available. FINDINGS: Brain: No acute infarct, mass effect or extra-axial collection. No chronic microhemorrhage or siderosis. There is multifocal hyperintense T2-weighted signal within the white matter. Generalized volume loss. The midline structures are normal. Vascular: Normal flow voids. Skull and upper cervical spine: Normal marrow signal. Sinuses/Orbits: Negative. Other: None. IMPRESSION: 1. No acute intracranial abnormality. 2. Findings of chronic small vessel ischemia and volume loss.  Electronically Signed   By: Deatra Robinson M.D.   On: 04/12/2023 19:26   DG Chest Portable 1 View  Result Date: 04/12/2023 CLINICAL DATA:  Cough. EXAM: PORTABLE CHEST 1 VIEW COMPARISON:  Chest radiograph dated January 27, 2010. FINDINGS: The heart size and mediastinal contours are within normal limits. Aortic atherosclerosis. Hyperinflation. No focal consolidation, pleural effusion, or pneumothorax. Surgical clips project over the left axilla. No acute osseous abnormality. IMPRESSION: No acute cardiopulmonary findings. Electronically Signed   By: Hart Robinsons M.D.   On: 04/12/2023 16:04    Pending Labs Unresulted Labs (From admission, onward)     Start     Ordered   04/13/23 0500  Basic metabolic panel  Tomorrow morning,   R        04/12/23 2113   04/13/23 0500  CBC  Tomorrow morning,   R        04/12/23 2113            Vitals/Pain Today's Vitals   04/12/23 1915 04/12/23 1945 04/12/23 2015 04/12/23 2051  BP: (!) 143/63 131/79 (!) 140/79   Pulse: 89 87 82   Resp: 14 20 20    Temp:    98.6 F (37 C)  TempSrc:      SpO2: 99% 100% 100%   Weight:      Height:      PainSc:        Isolation Precautions Airborne and Contact precautions  Medications Medications  tamoxifen (NOLVADEX) tablet 20 mg (has no administration in time range)  pravastatin (PRAVACHOL) tablet 40 mg (has no administration in time range)  hydrochlorothiazide (HYDRODIURIL) tablet 25 mg (has no administration in time range)  amLODipine (NORVASC) tablet 2.5 mg (has no administration in time range)  enoxaparin (LOVENOX) injection 40 mg (has no administration in time range)  ondansetron (ZOFRAN) tablet 4 mg (has no administration in time range)    Or  ondansetron (ZOFRAN) injection 4 mg (has no administration in time range)  LORazepam (ATIVAN) tablet 1 mg (1 mg Oral Given 04/12/23 1758)    Mobility walks with person assist. According to family the PT normally walks without assistance.     Focused  Assessments  R Recommendations: See Admitting Provider Note  Report given to:   Additional Notes:

## 2023-04-12 NOTE — ED Provider Notes (Signed)
[place] EMERGENCY DEPARTMENT AT [facility] Provider Note  CSN: [id number] Arrival date & time: 04/12/23 1139  Chief Complaint(s) Weakness and Cough  HPI Nancy Bell is a 89 y.o. female here today for intermittent episodes of weakness, periods where "her legs do not work."  Patient is here with her daughter at bedside.  Reportedly, the patient was so weak in her legs today that she was unable to walk.  Typically she ambulates at home without assistance, does do some shuffling.  Daughter reports that a similar episode happened a few months ago.   Past Medical History Past Medical History:  Diagnosis Date   Anxiety    Cancer (HCC)    right breast   History of hiatal hernia    Hyperlipidemia    Hypertension    Patient Active Problem List   Diagnosis Date Noted   Intractable nausea and vomiting 10/01/2022   Hypokalemia 10/01/2022   Age-related osteoporosis without current pathological fracture 09/30/2021   Anxiety disorder 09/30/2021   Dysphagia 09/30/2021   Essential hypertension 09/30/2021   Pure hypercholesterolemia 09/30/2021   Hyperglycemia 09/30/2021   Atherosclerosis of abdominal aorta (HCC) 09/30/2021   Cancer of left female breast (HCC) 09/30/2020   Hx breast cancer, IDC right LIQ,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 Authorizing Provider  ALPRAZolam (XANAX) 0.25 MG tablet Take 0.125 mg by mouth daily. 01/23/15   [provider]  amLODipine (NORVASC) 2.5 MG tablet Take 2.5 mg by mouth daily.    [provider]  Cholecalciferol (VITAMIN D-3 PO) Take 1 capsule by mouth daily.    [provider]  Coenzyme Q10 (CO Q 10 PO) Take 1 capsule by mouth daily.    [provider]  hydrochlorothiazide (HYDRODIURIL) 25 MG tablet Take 1 tablet (25 mg total) by mouth daily. 10/05/22   Pokhrel, Rebekah Chesterfield, MD  loperamide (IMODIUM A-D) 2 MG  tablet Take 1 tablet (2 mg total) by mouth 3 (three) times daily as needed for diarrhea or loose stools. 10/02/22   Pokhrel, Rebekah Chesterfield, MD  lovastatin (MEVACOR) 40 MG tablet Take 40 mg by mouth daily. 01/31/15   [provider]  Multiple Vitamin (MULTIVITAMIN PO) Take 1 tablet by mouth daily.    [provider]  naproxen sodium (ALEVE) 220 MG tablet Take 220 mg by mouth daily as needed (pain).    [provider]  Omega-3 Fatty Acids (FISH OIL PO) Take 1 capsule by mouth daily.    [provider]  ondansetron (ZOFRAN) 4 MG tablet Take 1 tablet (4 mg total) by mouth every 8 (eight) hours as needed for nausea or vomiting. 10/02/22 10/02/23  Pokhrel, Rebekah Chesterfield, MD  Potassium Chloride ER 20 MEQ TBCR Take 1 tablet by mouth 2 (two) times daily. 03/10/23   [provider]  tamoxifen (NOLVADEX) 20 MG tablet TAKE 1 TABLET BY MOUTH EVERY DAY 02/22/23   Josph Macho, MD  Past Surgical History Past Surgical History:  Procedure Laterality Date   BREAST BIOPSY  2 weeks ago   BREAST LUMPECTOMY  01/29/2010   Right- Dr Jamey Ripa   TOOTH EXTRACTION     TOTAL MASTECTOMY Left 09/30/2020   Procedure: LEFT TOTAL MASTECTOMY;  Surgeon: Griselda Miner, MD;  Location: [facility] OR;  Service: General;  Laterality: Left;   Family History Family History  Problem Relation Age of Onset   Cancer Father        kidney   Cancer Son 4       testicular    Social History Social History   Tobacco Use   Smoking status: Never   Smokeless tobacco: Never   Tobacco comments:    never used tobacco  Vaping Use   Vaping status: Never Used  Substance Use Topics   Alcohol use: No    Alcohol/week: 0.0 standard drinks of alcohol   Drug use: No   Allergies Augmentin [amoxicillin-pot clavulanate], Vicodin [hydrocodone-acetaminophen], Oxycontin [oxycodone hcl], Tylenol  [acetaminophen], and Ultram [tramadol hcl]  Review of Systems Review of Systems  Physical Exam Vital Signs  I have reviewed the triage vital signs BP 134/62   Pulse 94   Temp 98.1 F (36.7 C) (Oral)   Resp 20   Ht 5\' 2"  (1.575 m)   Wt 56.2 kg   SpO2 99%   BMI 22.68 kg/m   Physical Exam Vitals reviewed.  HENT:     Head: Normocephalic and atraumatic.     Nose: Nose normal. No congestion.  Eyes:     Pupils: Pupils are equal, round, and reactive to light.  Cardiovascular:     Rate and Rhythm: Normal rate.  Pulmonary:     Effort: Pulmonary effort is normal.  Musculoskeletal:        General: No swelling or deformity. Normal range of motion.  Skin:    General: Skin is warm and dry.  Neurological:     General: No focal deficit present.     Mental Status: She is alert.     Comments: 5-5 bilateral lower extremity strength with leg extension, plantar and dorsi flexion.  No diminished sensation bilateral lower extremities.  Psychiatric:        Mood and Affect: Mood normal.     ED Results and Treatments Labs (all labs ordered are listed, but only abnormal results are displayed) Labs Reviewed  CBC - Abnormal; Notable for the following components:      Result Value   WBC 12.6 (*)    All other components within normal limits  COMPREHENSIVE METABOLIC PANEL - Abnormal; Notable for the following components:   Glucose, Bld 109 (*)    GFR, Estimated 59 (*)    All other components within normal limits  CBG MONITORING, ED - Abnormal; Notable for the following components:   Glucose-Capillary 103 (*)    All other components within normal limits  RESP PANEL BY RT-PCR (RSV, FLU A&B, COVID)  RVPGX2  URINALYSIS, ROUTINE W REFLEX MICROSCOPIC  Radiology No results found.  Pertinent labs & imaging results that were available during my care of the patient were  reviewed by me and considered in my medical decision making (see MDM for details).  Medications Ordered in ED Medications  LORazepam (ATIVAN) tablet 1 mg (has no administration in time range)                                                                                                                                     Procedures Procedures  (including critical care time)  Medical Decision Making / ED Course   This patient presents to the ED for concern of brief period of weakness, this involves an extensive number of treatment options, and is a complaint that carries with it a high risk of complications and morbidity.  The differential diagnosis includes TIA, advanced age, less likely cord compression, metabolic abnormalities, anemia, less likely infection.  MDM: On exam, patient overall looks well.  She has no neurological deficits.  It appears that she has these episodes where she developed some weakness.  At this time, patient without any weakness.  I have held off on ambulating the patient will be obtained some imaging.  Symptoms could be consistent with TIA.  Patient also endorsing some episodic dizziness.  Possibly posterior CVA although is a bit less consistent with this.  Routine blood work ordered as well.   Additional history obtained: -Additional history obtained from daughter at bedside -External records from outside source obtained and reviewed including: Chart review including previous notes, labs, imaging, consultation notes   Lab Tests: -I ordered, reviewed, and interpreted labs.   The pertinent results include:   Labs Reviewed  CBC - Abnormal; Notable for the following components:      Result Value   WBC 12.6 (*)    All other components within normal limits  COMPREHENSIVE METABOLIC PANEL - Abnormal; Notable for the following components:   Glucose, Bld 109 (*)    GFR, Estimated 59 (*)    All other components within normal limits  CBG MONITORING, ED -  Abnormal; Notable for the following components:   Glucose-Capillary 103 (*)    All other components within normal limits  RESP PANEL BY RT-PCR (RSV, FLU A&B, COVID)  [id number]  URINALYSIS, ROUTINE W REFLEX MICROSCOPIC      EKG my independent review the patient's EKG shows no ST segment depressions or elevations, no T wave versions, no evidence of acute ischemia.  EKG Interpretation Date/Time:  Monday April 12 2023 12:15:18 EST Ventricular Rate:  87 PR Interval:  168 QRS Duration:  74 QT Interval:  356 QTC Calculation: 428 R Axis:   -3  Text Interpretation: Normal sinus rhythm with sinus arrhythmia Low voltage QRS Borderline ECG When compared with ECG of 01-Oct-2022 11:42, PREVIOUS ECG IS PRESENT Confirmed by Anders Simmonds [phone]) on 04/12/2023 3:51:41 PM         Imaging Studies  ordered: I ordered imaging studies including chest x-ray, MRI brain I independently visualized and interpreted imaging. I agree with the radiologist interpretation   Medicines ordered and prescription drug management: Meds ordered this encounter  Medications   LORazepam (ATIVAN) tablet 1 mg    -I have reviewed the patients home medicines and have made adjustments as needed   Cardiac Monitoring: The patient was maintained on a cardiac monitor.  I personally viewed and interpreted the cardiac monitored which showed an underlying rhythm of: Normal sinus rhythm  Social Determinants of Health:  Factors impacting patients care include:    Reevaluation: After the interventions noted above, I reevaluated the patient and found that they have :improved  Co morbidities that complicate the patient evaluation  Past Medical History:  Diagnosis Date   Anxiety    Cancer (HCC)    right breast   History of hiatal hernia    Hyperlipidemia    Hypertension       Dispostion: Patient signed out to Dr. Theresia Lo pending labs, MRI and disposition.     Final Clinical Impression(s) / ED  Diagnoses Final diagnoses:  Weakness     @PCDICTATION @    Anders Simmonds T, DO 04/12/23 1552

## 2023-04-12 NOTE — ED Provider Notes (Signed)
Patient signed out to me at 1600 by Dr. Andria Meuse pending MRI.  In short this is a 89 year old female with a past medical history of hypertension that presented to the emergency department with intermittent episodes of weakness and confusion.  She reported that her legs would give out on her and had some associated dizziness and difficulty walking.  She also reports that she had nausea and vomiting last night.  On her arrival here she was hemodynamically stable in no acute distress without any focal neurologic deficits.  Initial labs were performed that showed no evidence of infection or other etiology to explain her weakness.  The patient does have 5 out of 5 strength in all 4 extremities, cranial nerves intact, sensation intact, normal finger-to-nose, no drift in all 4 extremities.  MRI is pending at this time to evaluate for possible subacute stroke versus TIA.  Clinical Course as of 04/12/23 2011  Mon Apr 12, 2023  1929 No acute abnormality on MRI brain. Unclear etiology of intermittent weakness and confusion, possible new onset dementia. Attempted to ambulate patient and was very unsteady and could not ambulate unsupported. Will plan for admission for weakness [VK]    Clinical Course User Index [VK] Rexford Maus, DO      Rexford Maus, [place] 04/12/23 2011

## 2023-04-12 NOTE — ED Provider Triage Note (Signed)
Emergency Medicine Provider Triage Evaluation Note  Nancy Bell , a 89 y.o. female  was evaluated in triage.  Pt complains of increased weakness for several months. Pt states she had difficulty sleeping last night, woke up and was too weak to walk. Started having sore throat and some cough starting yesterday.  Review of Systems  Positive: Weakness, sore throat, cough, intermittent diarrhea Negative: CP, SOB, abd pain, N/V, urinary sx, syncope  Physical Exam  BP (!) 152/72 (BP Location: Right Arm)   Pulse 93   Temp 98.1 F (36.7 C) (Oral)   Resp (!) 22   Ht 5\' 2"  (1.575 m)   Wt 56.2 kg   SpO2 99%   BMI 22.68 kg/m  Gen:   Awake, no distress   Resp:  Normal effort  MSK:   Moves extremities without difficulty  Other:    Medical Decision Making  Medically screening exam initiated at 12:05 PM.  Appropriate orders placed.  Nancy Bell was informed that the remainder of the evaluation will be completed by another provider, this initial triage assessment does not replace that evaluation, and the importance of remaining in the ED until their evaluation is complete.  Workup initiated   Su Monks, PA-C 04/12/23 1208

## 2023-04-12 NOTE — ED Triage Notes (Addendum)
Pt to ED accompanied with daughter c/o increased weakness x a couple months. Pt daughter concerned today because when pt woke up reports she could not walk. Pt able to walk in triage with minimal assist. Pt also reports poor sleep last night, shakes noted, pt reports this is normal for her.   Called EMS this morning when pt reported couldn't walk, went to [facility] , but left prior to being seen.   Pt c/o sore throat that started today

## 2023-04-12 NOTE — ED Notes (Addendum)
Called MRI regarding the timeline for Pt. Stated they would send for the Pt now.

## 2023-04-13 DIAGNOSIS — R531 Weakness: Secondary | ICD-10-CM

## 2023-04-13 LAB — CBC
HCT: 39 % (ref 36.0–46.0)
Hemoglobin: 12.8 g/dL (ref 12.0–15.0)
MCH: 29.6 pg (ref 26.0–34.0)
MCHC: 32.8 g/dL (ref 30.0–36.0)
MCV: 90.1 fL (ref 80.0–100.0)
Platelets: 208 10*3/uL (ref 150–400)
RBC: 4.33 MIL/uL (ref 3.87–5.11)
RDW: 12.8 % (ref 11.5–15.5)
WBC: 8 10*3/uL (ref 4.0–10.5)
nRBC: 0 % (ref 0.0–0.2)

## 2023-04-13 LAB — BASIC METABOLIC PANEL
Anion gap: 12 (ref 5–15)
BUN: 12 mg/dL (ref 8–23)
CO2: 22 mmol/L (ref 22–32)
Calcium: 8.6 mg/dL — ABNORMAL LOW (ref 8.9–10.3)
Chloride: 104 mmol/L (ref 98–111)
Creatinine, Ser: 1.3 mg/dL — ABNORMAL HIGH (ref 0.44–1.00)
GFR, Estimated: 38 mL/min — ABNORMAL LOW (ref >60)
Glucose, Bld: 114 mg/dL — ABNORMAL HIGH (ref 70–99)
Potassium: 3.1 mmol/L — ABNORMAL LOW (ref 3.5–5.1)
Sodium: 138 mmol/L (ref 135–145)

## 2023-04-13 MED ORDER — POTASSIUM CHLORIDE CRYS ER 20 MEQ PO TBCR
40.0000 meq | EXTENDED_RELEASE_TABLET | Freq: Once | ORAL | Status: AC
  Administered 2023-04-13: 40 meq via ORAL
  Filled 2023-04-13: qty 2

## 2023-04-13 MED ORDER — ENOXAPARIN SODIUM 30 MG/0.3ML IJ SOSY
30.0000 mg | PREFILLED_SYRINGE | INTRAMUSCULAR | Status: DC
  Administered 2023-04-13 – 2023-04-19 (×7): 30 mg via SUBCUTANEOUS
  Filled 2023-04-13 (×7): qty 0.3

## 2023-04-13 MED ORDER — KETOROLAC TROMETHAMINE 15 MG/ML IJ SOLN
7.5000 mg | Freq: Once | INTRAMUSCULAR | Status: AC
  Administered 2023-04-13: 7.5 mg via INTRAVENOUS
  Filled 2023-04-13: qty 1

## 2023-04-13 MED ORDER — ENSURE ENLIVE PO LIQD
237.0000 mL | Freq: Two times a day (BID) | ORAL | Status: DC
  Administered 2023-04-14 – 2023-04-19 (×9): 237 mL via ORAL

## 2023-04-13 MED ORDER — TRAZODONE HCL 50 MG PO TABS
50.0000 mg | ORAL_TABLET | Freq: Every evening | ORAL | Status: DC | PRN
  Administered 2023-04-13 – 2023-04-18 (×3): 50 mg via ORAL
  Filled 2023-04-13 (×3): qty 1

## 2023-04-13 NOTE — Evaluation (Signed)
Physical Therapy Evaluation Patient Details Name: Nancy Bell MRN: [id number] DOB: 06-18-1924 Today's Date: 04/13/2023  History of Present Illness  89 yo presents with confusion and difficulty walking MRI negative PMH HTN HLD anxiety breast CA  total mastectomy  Clinical Impression  Received pt semi-reclined in bed with son in law at beside. Pt fixated on toileting throughout session and did not understand that she had catheter in place. Pt eager to move and initiating getting OOB, but required min A over for bed mobility for BLE management and trunk control using bed features. Pt performed transfers with RW and mod A - noted posterior lean in standing and generalized unsteadiness. Pt with poor sequencing/motor planning and difficulty following cues. Per son in law, pt lives with him and wife in 1 level home with 4 STE and 2 handrails. Family available to provide 24/7 supervision upon discharge. Recommend continued therapy >3hrs per day to maximize independence and address current strength/balance deficits.       If plan is discharge home, recommend the following: A lot of help with walking and/or transfers;A lot of help with bathing/dressing/bathroom;Direct supervision/assist for medications management;Direct supervision/assist for financial management;Assist for transportation;Help with stairs or ramp for entrance;Supervision due to cognitive status   Can travel by private vehicle        Equipment Recommendations Other (comment) (TBD in next venue)  Recommendations for Other Services  Rehab consult    Functional Status Assessment Patient has had a recent decline in their functional status and demonstrates the ability to make significant improvements in function in a reasonable and predictable amount of time.     Precautions / Restrictions Precautions Precautions: Fall Restrictions Weight Bearing Restrictions: No      Mobility  Bed Mobility Overal bed mobility: Needs  Assistance Bed Mobility: Rolling, Sit to Supine, Supine to Sit Rolling: Supervision   Supine to sit: Min assist Sit to supine: Min assist   General bed mobility comments: pt eager to get OOB, but required assist at LEs and at trunk due to weakness and difficulty sequencing    Transfers Overall transfer level: Needs assistance Equipment used: Rolling walker (2 wheels) Transfers: Sit to/from Stand, Bed to chair/wheelchair/BSC Sit to Stand: Mod assist Stand pivot transfers: Mod assist         General transfer comment: pt required cues for technique    Ambulation/Gait               General Gait Details: unable due to weakness and fatigue - pt visibly SOB and HR reaching 120bpm just transferring  Stairs            Wheelchair Mobility     Tilt Bed    Modified Rankin (Stroke Patients Only)       Balance Overall balance assessment: Needs assistance Sitting-balance support: Feet supported, Bilateral upper extremity supported Sitting balance-Leahy Scale: Fair Sitting balance - Comments: able to maintain static sitting balance with close supervision Postural control: Posterior lean Standing balance support: Bilateral upper extremity supported, Reliant on assistive device for balance (RW) Standing balance-Leahy Scale: Poor Standing balance comment: pt with posterior lean in standing, requiring mod A and cues to correct                             Pertinent Vitals/Pain Pain Assessment Pain Assessment: No/denies pain    Home Living Family/patient expects to be discharged to:: Private residence Living Arrangements: Children (daughter and son in Social worker)  Available Help at Discharge: Family;Available 24 hours/day Type of Home: House Home Access: Stairs to enter Entrance Stairs-Rails: Right;Left;Can reach both Entrance Stairs-Number of Steps: 4   Home Layout: One level Home Equipment: Hand held shower head;Shower seat;Grab bars - toilet Additional  Comments: was independent without AD prior to admission    Prior Function                       Extremity/Trunk Assessment   Upper Extremity Assessment Upper Extremity Assessment: Defer to OT evaluation    Lower Extremity Assessment Lower Extremity Assessment: Generalized weakness    Cervical / Trunk Assessment Cervical / Trunk Assessment: Kyphotic  Communication   Communication Communication: Difficulty following commands/understanding Following commands: Follows one step commands inconsistently Cueing Techniques: Verbal cues  Cognition Arousal: Alert Behavior During Therapy: Anxious (perseverating on using toilet) Overall Cognitive Status: Difficult to assess                                 General Comments: fixated on toileting and difficult to redirect. Decreased insight into deficits        General Comments General comments (skin integrity, edema, etc.): pt's son in law present and helpful during evaluation    Exercises     Assessment/Plan    PT Assessment Patient needs continued PT services  PT Problem List Decreased strength;Decreased cognition;Decreased range of motion;Decreased activity tolerance;Decreased balance;Decreased mobility;Decreased coordination;Cardiopulmonary status limiting activity;Decreased knowledge of precautions;Decreased safety awareness       PT Treatment Interventions DME instruction;Gait training;Stair training;Functional mobility training;Therapeutic activities;Therapeutic exercise;Balance training;Wheelchair mobility training;Patient/family education;Cognitive remediation;Neuromuscular re-education    PT Goals (Current goals can be found in the Care Plan section)  Acute Rehab PT Goals Patient Stated Goal: to go home PT Goal Formulation: With patient/family Time For Goal Achievement: 04/27/23 Potential to Achieve Goals: Fair    Frequency Min 1X/week     Co-evaluation               AM-PAC PT "6  Clicks" Mobility  Outcome Measure Help needed turning from your back to your side while in a flat bed without using bedrails?: A Lot Help needed moving from lying on your back to sitting on the side of a flat bed without using bedrails?: A Lot Help needed moving to and from a bed to a chair (including a wheelchair)?: A Lot Help needed standing up from a chair using your arms (e.g., wheelchair or bedside chair)?: A Lot Help needed to walk in hospital room?: A Lot Help needed climbing 3-5 steps with a railing? : A Lot 6 Click Score: 12    End of Session Equipment Utilized During Treatment: Gait belt Activity Tolerance: Patient tolerated treatment well;Patient limited by fatigue Patient left: in chair;with call bell/phone within reach;with chair alarm set;with family/visitor present Nurse Communication: Mobility status PT Visit Diagnosis: Unsteadiness on feet (R26.81);Other abnormalities of gait and mobility (R26.89);Muscle weakness (generalized) (M62.81)    Time: 1610-9604 PT Time Calculation (min) (ACUTE ONLY): 39 min   Charges:   PT Evaluation $PT Eval Moderate Complexity: 1 Mod PT Treatments $Therapeutic Activity: 23-37 mins PT General Charges $$ ACUTE PT VISIT: 1 Visit         Blima Rich PT, DPT [facility] 04/13/2023, 12:08 PM

## 2023-04-13 NOTE — ED Notes (Signed)
Patient provided a new diaper and cleaned. Patient provided with a purewik as well. New warm blankets applied after.

## 2023-04-13 NOTE — Progress Notes (Signed)
Nancy Bell is a 89 y.o. female patient admitted. Awake, alert - oriented  X 2 with period of confusion- no acute distress noted.  VSS - Blood pressure (!) 141/88, pulse 97, temperature 97.6 F (36.4 C), temperature source Oral, resp. rate 20, height 5\' 2"  (1.575 m), weight 56.2 kg, SpO2 96%.    IV in place, occlusive dsg intact without redness.  Orientation to room, and floor completed.  Admission INP armband ID verified with patient/family, and in place. Patient had Large BM, purwick in place.   SR up x 2, fall assessment complete, with patient and family able to verbalize understanding of risk associated with falls, and verbalized understanding to call nsg before up out of bed.  Call light within reach, patient needs verbal cueing No evidence of skin break down noted on exam.    Kyndal Heringer, RN 04/13/2023 7:50 AM

## 2023-04-13 NOTE — ED Notes (Signed)
ED TO INPATIENT HANDOFF REPORT  ED Nurse Name and Phone #: Williams Che [phone]  S Name/Age/Gender Nancy Bell 89 y.o. female Room/Bed: 037C/037C  Code Status   Code Status: Full Code  Home/SNF/Other Home Patient oriented to: self Is this baseline? No   Triage Complete: Triage complete  Chief Complaint Weakness [R53.1]  Triage Note Pt to ED accompanied with daughter c/o increased weakness x a couple months. Pt daughter concerned today because when pt woke up reports she could not walk. Pt able to walk in triage with minimal assist. Pt also reports poor sleep last night, shakes noted, pt reports this is normal for her.   Called EMS this morning when pt reported couldn't walk, went to [facility] , but left prior to being seen.   Pt c/o sore throat that started today    Allergies Allergies  Allergen Reactions   Augmentin [Amoxicillin-Pot Clavulanate] Other (See Comments)    Develops C Diff   Vicodin [Hydrocodone-Acetaminophen] Other (See Comments)    Insomnia    Oxycontin [Oxycodone Hcl] Other (See Comments)    Unknown reaction   Tylenol [Acetaminophen] Nausea And Vomiting    Chills    Ultram [Tramadol Hcl] Other (See Comments)    Unknown reaction    Level of Care/Admitting Diagnosis ED Disposition     ED Disposition  Admit   Condition  --   Comment  Hospital Area: [facility] [[id number]]  Level of Care: Telemetry Medical [104]  May admit patient to [facility] or [facility] if equivalent level of care is available:: No  Covid Evaluation: Asymptomatic - no recent exposure (last 10 days) testing not required  Diagnosis: Weakness [241835]  Admitting Physician: Alan Mulder [[id number]]  Attending Physician: Alan Mulder [[id number]]  Certification:: I certify this patient will need inpatient services for at least 2 midnights  Expected Medical Readiness: 04/15/2023          B Medical/Surgery History Past Medical History:   Diagnosis Date   Anxiety    Cancer (HCC)    right breast   History of hiatal hernia    Hyperlipidemia    Hypertension    Past Surgical History:  Procedure Laterality Date   BREAST BIOPSY  2 weeks ago   BREAST LUMPECTOMY  01/29/2010   Right- Dr Jamey Ripa   TOOTH EXTRACTION     TOTAL MASTECTOMY Left 09/30/2020   Procedure: LEFT TOTAL MASTECTOMY;  Surgeon: Griselda Miner, MD;  Location: [facility] OR;  Service: General;  Laterality: Left;     A IV Location/Drains/Wounds Patient Lines/Drains/Airways Status     Active Line/Drains/Airways     Name Placement date Placement time Site Days   Closed System Drain 1 Left Breast Bulb (JP) 19 Fr. 09/30/20  1301  Breast  925   External Urinary Catheter 04/13/23  0035  --  less than 1            Intake/Output Last 24 hours No intake or output data in the 24 hours ending 04/13/23 0253  Labs/Imaging Results for orders placed or performed during the hospital encounter of 04/12/23 (from the past 48 hour(s))  Resp panel by RT-PCR (RSV, Flu A&B, Covid) Anterior Nasal Swab     Status: None   Collection Time: 04/12/23 11:55 AM   Specimen: Anterior Nasal Swab  Result Value Ref Range   SARS Coronavirus 2 by RT PCR NEGATIVE NEGATIVE   Influenza A by PCR NEGATIVE NEGATIVE   Influenza B by PCR NEGATIVE  NEGATIVE    Comment: (NOTE) The Xpert Xpress SARS-CoV-2/FLU/RSV plus assay is intended as an aid in the diagnosis of influenza from Nasopharyngeal swab specimens and should not be used as a sole basis for treatment. Nasal washings and aspirates are unacceptable for Xpert Xpress SARS-CoV-2/FLU/RSV testing.  Fact Sheet for Patients: BloggerCourse.com  Fact Sheet for Healthcare Providers: SeriousBroker.it  This test is not yet approved or cleared by the [place] FDA and has been authorized for detection and/or diagnosis of SARS-CoV-2 by FDA under an Emergency Use Authorization (EUA). This EUA will  remain in effect (meaning this test can be used) for the duration of the COVID-19 declaration under Section 564(b)(1) of the Act, 21 U.S.C. section 360bbb-3(b)(1), unless the authorization is terminated or revoked.     Resp Syncytial Virus by PCR NEGATIVE NEGATIVE    Comment: (NOTE) Fact Sheet for Patients: BloggerCourse.com  Fact Sheet for Healthcare Providers: SeriousBroker.it  This test is not yet approved or cleared by the [place] FDA and has been authorized for detection and/or diagnosis of SARS-CoV-2 by FDA under an Emergency Use Authorization (EUA). This EUA will remain in effect (meaning this test can be used) for the duration of the COVID-19 declaration under Section 564(b)(1) of the Act, 21 U.S.C. section 360bbb-3(b)(1), unless the authorization is terminated or revoked.  Performed at [facility], [address] [place], [place] [address]   CBC     Status: Abnormal   Collection Time: 04/12/23 12:19 PM  Result Value Ref Range   WBC 12.6 (H) 4.0 - 10.5 K/uL   RBC 4.53 3.87 - 5.11 MIL/uL   Hemoglobin 13.3 12.0 - 15.0 g/dL   HCT 46.9 62.9 - 52.8 %   MCV 92.3 80.0 - 100.0 fL   MCH 29.4 26.0 - 34.0 pg   MCHC 31.8 30.0 - 36.0 g/dL   RDW 41.3 24.4 - 01.0 %   Platelets 228 150 - 400 K/uL   nRBC 0.0 0.0 - 0.2 %    Comment: Performed at [facility], [address], [place], [place] [address]  CBG monitoring, ED     Status: Abnormal   Collection Time: 04/12/23 12:19 PM  Result Value Ref Range   Glucose-Capillary 103 (H) 70 - 99 mg/dL    Comment: Glucose reference range applies only to samples taken after fasting for at least 8 hours.  Comprehensive metabolic panel     Status: Abnormal   Collection Time: 04/12/23 12:19 PM  Result Value Ref Range   Sodium 140 135 - 145 mmol/L   Potassium 3.7 3.5 - 5.1 mmol/L   Chloride 109 98 - 111 mmol/L   CO2 23 22 - 32 mmol/L   Glucose, Bld 109 (H) 70 - 99 mg/dL     Comment: Glucose reference range applies only to samples taken after fasting for at least 8 hours.   BUN 17 8 - 23 mg/dL   Creatinine, Ser 6.64 0.44 - 1.00 mg/dL   Calcium 9.3 8.9 - 40.3 mg/dL   Total Protein 6.8 6.5 - 8.1 g/dL   Albumin 3.8 3.5 - 5.0 g/dL   AST 22 15 - 41 U/L   ALT 11 0 - 44 U/L   Alkaline Phosphatase 90 38 - 126 U/L   Total Bilirubin 0.6 <1.2 mg/dL   GFR, Estimated 59 (L) >60 mL/min    Comment: (NOTE) Calculated using the CKD-EPI Creatinine Equation (2021)    Anion gap 8 5 - 15    Comment:  Performed at [facility], [address] [place], [place] [address]  Urinalysis, Routine w reflex microscopic -Urine, Clean Catch     Status: Abnormal   Collection Time: 04/12/23  3:39 PM  Result Value Ref Range   Color, Urine YELLOW YELLOW   APPearance HAZY (A) CLEAR   Specific Gravity, Urine 1.018 1.005 - 1.030   pH 5.0 5.0 - 8.0   Glucose, UA NEGATIVE NEGATIVE mg/dL   Hgb urine dipstick NEGATIVE NEGATIVE   Bilirubin Urine NEGATIVE NEGATIVE   Ketones, ur NEGATIVE NEGATIVE mg/dL   Protein, ur NEGATIVE NEGATIVE mg/dL   Nitrite NEGATIVE NEGATIVE   Leukocytes,Ua NEGATIVE NEGATIVE    Comment: Performed at [facility], [address], [place], [place] [address]   MR BRAIN WO CONTRAST  Result Date: 04/12/2023 CLINICAL DATA:  Stroke suspected EXAM: MRI HEAD WITHOUT CONTRAST TECHNIQUE: Multiplanar, multiecho pulse sequences of the brain and surrounding structures were obtained without intravenous contrast. COMPARISON:  None Available. FINDINGS: Brain: No acute infarct, mass effect or extra-axial collection. No chronic microhemorrhage or siderosis. There is multifocal hyperintense T2-weighted signal within the white matter. Generalized volume loss. The midline structures are normal. Vascular: Normal flow voids. Skull and upper cervical spine: Normal marrow signal. Sinuses/Orbits: Negative. Other: None. IMPRESSION: 1. No acute intracranial abnormality. 2. Findings of  chronic small vessel ischemia and volume loss. Electronically Signed   By: Deatra Robinson M.D.   On: 04/12/2023 19:26   DG Chest Portable 1 View  Result Date: 04/12/2023 CLINICAL DATA:  Cough. EXAM: PORTABLE CHEST 1 VIEW COMPARISON:  Chest radiograph dated January 27, 2010. FINDINGS: The heart size and mediastinal contours are within normal limits. Aortic atherosclerosis. Hyperinflation. No focal consolidation, pleural effusion, or pneumothorax. Surgical clips project over the left axilla. No acute osseous abnormality. IMPRESSION: No acute cardiopulmonary findings. Electronically Signed   By: Hart Robinsons M.D.   On: 04/12/2023 16:04    Pending Labs Unresulted Labs (From admission, onward)     Start     Ordered   04/13/23 0500  Basic metabolic panel  Tomorrow morning,   R        04/12/23 2113   04/13/23 0500  CBC  Tomorrow morning,   R        04/12/23 2113            Vitals/Pain Today's Vitals   04/12/23 2051 04/12/23 2230 04/12/23 2322 04/13/23 0030  BP:  (!) 155/88  (!) 152/90  Pulse:  96 90   Resp:  15 18   Temp: 98.6 F (37 C)     TempSrc:      SpO2:  100% 100%   Weight:      Height:      PainSc:        Isolation Precautions No active isolations  Medications Medications  pravastatin (PRAVACHOL) tablet 40 mg (has no administration in time range)  enoxaparin (LOVENOX) injection 40 mg (40 mg Subcutaneous Given 04/13/23 0037)  ondansetron (ZOFRAN) tablet 4 mg (has no administration in time range)    Or  ondansetron (ZOFRAN) injection 4 mg (has no administration in time range)  traZODone (DESYREL) tablet 50 mg (50 mg Oral Given 04/13/23 0221)  LORazepam (ATIVAN) tablet 1 mg (1 mg Oral Given 04/12/23 1758)    Mobility Normally walks at home but recently has declined in strength     Focused Assessments    R Recommendations: See Admitting Provider Note  Report given to:   Additional Notes:

## 2023-04-13 NOTE — H&P (Signed)
History and Physical    Nancy Bell WNU:[id number] DOB: Oct 08, 1924 DOA: 04/12/2023  PCP: Darrow Bussing, MD   Chief Complaint:  leg weakness  HPI: Nancy Bell is a 89 y.o. female with medical history significant of hypertension, hyperlipidemia, breast cancer who presents emergency department due to weakness.  Patient's had episode of worsening confusion with difficulty walking.  Family was concerned about possible neurologic deficits and worsening confusion so she was brought to the ER for further assessment.  On arrival she was afebrile hemodynamically stable.  Labs were obtained which revealed COVID and flu negative, WBC 12.6, hemoglobin 13.3, CMP unrevealing, urinalysis negative for infection.  Chest x-ray showed no acute findings.  MRI head showed no acute infarcts.  Patient was admitted for further workup.  On evaluation she was confused.  She was stating things like she had prior hysterectomy however family states this is not true.  No focal neurologic deficits on evaluation to suggest acute stroke.   Review of Systems: Review of Systems  Constitutional:  Negative for chills and fever.  HENT: Negative.    Respiratory: Negative.    Cardiovascular: Negative.   Gastrointestinal: Negative.   Genitourinary: Negative.   Musculoskeletal: Negative.   Skin: Negative.   Neurological:  Positive for weakness. Negative for dizziness, tingling, tremors, sensory change, speech change, loss of consciousness and headaches.  Endo/Heme/Allergies: Negative.   Psychiatric/Behavioral: Negative.       As per HPI otherwise 10 point review of systems negative.   Allergies  Allergen Reactions   Augmentin [Amoxicillin-Pot Clavulanate] Other (See Comments)    Develops C Diff   Vicodin [Hydrocodone-Acetaminophen] Other (See Comments)    Insomnia    Oxycontin [Oxycodone Hcl] Other (See Comments)    Unknown reaction   Tylenol [Acetaminophen] Nausea And Vomiting    Chills    Ultram [Tramadol  Hcl] Other (See Comments)    Unknown reaction    Past Medical History:  Diagnosis Date   Anxiety    Cancer (HCC)    right breast   History of hiatal hernia    Hyperlipidemia    Hypertension     Past Surgical History:  Procedure Laterality Date   BREAST BIOPSY  2 weeks ago   BREAST LUMPECTOMY  01/29/2010   Right- Dr Jamey Ripa   TOOTH EXTRACTION     TOTAL MASTECTOMY Left 09/30/2020   Procedure: LEFT TOTAL MASTECTOMY;  Surgeon: Griselda Miner, MD;  Location: [facility];  Service: General;  Laterality: Left;     reports that she has never smoked. She has never used smokeless tobacco. She reports that she does not drink alcohol and does not use drugs.  Family History  Problem Relation Age of Onset   Cancer Father        kidney   Cancer Son 54       testicular    Prior to Admission medications   Medication Sig Start Date End Date Taking? Authorizing Provider  ALPRAZolam (XANAX) 0.25 MG tablet Take 0.125 mg by mouth 2 (two) times daily. 01/23/15  Yes [provider]  Coenzyme Q10 (CO Q 10 PO) Take 1 capsule by mouth daily.   Yes [provider]  loperamide (IMODIUM A-D) 2 MG tablet Take 1 tablet (2 mg total) by mouth 3 (three) times daily as needed for diarrhea or loose stools. 10/02/22  Yes Pokhrel, Laxman, MD  lovastatin (MEVACOR) 40 MG tablet Take 40 mg by mouth daily. 01/31/15  Yes [provider]  naproxen sodium (ALEVE)  220 MG tablet Take 220 mg by mouth daily as needed (pain).   Yes [provider]  omeprazole (PRILOSEC) 20 MG capsule Take 20 mg by mouth daily.   Yes [provider]  Potassium Chloride ER 20 MEQ TBCR Take 20 mEq by mouth 2 (two) times daily. 03/10/23  Yes [provider]  amLODipine (NORVASC) 2.5 MG tablet Take 2.5 mg by mouth daily. Patient not taking: Reported on 04/12/2023    [provider]  hydrochlorothiazide (HYDRODIURIL) 25 MG tablet Take 1 tablet (25 mg total) by mouth daily. Patient not taking:  Reported on 04/12/2023 10/05/22   Pokhrel, Rebekah Chesterfield, MD  ondansetron (ZOFRAN) 4 MG tablet Take 1 tablet (4 mg total) by mouth every 8 (eight) hours as needed for nausea or vomiting. Patient not taking: Reported on 04/12/2023 10/02/22 10/02/23  Joycelyn Das, MD    Physical Exam: Vitals:   04/12/23 2051 04/12/23 2230 04/12/23 2322 04/13/23 0030  BP:  (!) 155/88  (!) 152/90  Pulse:  96 90   Resp:  15 18   Temp: 98.6 F (37 C)     TempSrc:      SpO2:  100% 100%   Weight:      Height:       Physical Exam Vitals reviewed.  HENT:     Head: Normocephalic.     Nose: Nose normal.     Mouth/Throat:     Mouth: Mucous membranes are moist.     Pharynx: Oropharynx is clear.  Eyes:     Conjunctiva/sclera: Conjunctivae normal.     Pupils: Pupils are equal, round, and reactive to light.  Cardiovascular:     Rate and Rhythm: Normal rate and regular rhythm.     Pulses: Normal pulses.     Heart sounds: Normal heart sounds.  Pulmonary:     Effort: Pulmonary effort is normal.     Breath sounds: Normal breath sounds.  Abdominal:     General: Abdomen is flat. Bowel sounds are normal.  Musculoskeletal:        General: Normal range of motion.     Cervical back: Normal range of motion.  Skin:    General: Skin is warm.     Capillary Refill: Capillary refill takes less than 2 seconds.  Neurological:     General: No focal deficit present.     Mental Status: She is alert. Mental status is at baseline.  Psychiatric:        Mood and Affect: Mood normal.       Labs on Admission: I have personally reviewed the patients's labs and imaging studies.  Assessment/Plan Principal Problem:   Weakness   # Weakness # Acute encephalopathy unclear etiology - Patient presenting with worsening leg weakness and and confusion - No signs of infection however patient does have leukocytosis - Patient denies any infectious complaints - Likely has underlying cognitive impairment - MRI  unrevealing  Plan: Possibly related to TIA though no focal deficits noted PT/OT evaluation Monitor on telemetry Discontinue home Xanax  # Hyperlipidemia-continue pravastatin    Admission status: Inpatient Telemetry Medical  Certification: The appropriate patient status for this patient is INPATIENT. Inpatient status is judged to be reasonable and necessary in order to provide the required intensity of service to ensure the patient's safety. The patient's presenting symptoms, physical exam findings, and initial radiographic and laboratory data in the context of their chronic comorbidities is felt to place them at high risk for further clinical deterioration. Furthermore, it is  not anticipated that the patient will be medically stable for discharge from the hospital within 2 midnights of admission.   * I certify that at the point of admission it is my clinical judgment that the patient will require inpatient hospital care spanning beyond 2 midnights from the point of admission due to high intensity of service, high risk for further deterioration and high frequency of surveillance required.Alan Mulder MD [facility] If 7PM-7AM, please contact night-coverage [email or web address]  04/13/2023, 12:56 AM

## 2023-04-13 NOTE — Progress Notes (Addendum)
PROGRESS NOTE  Nancy DONALDS  QIO:[id number] DOB: 1924-06-05 DOA: 04/12/2023 PCP: Darrow Bussing, MD   Brief Narrative: Patient is a 89 year old female with history of hypertension, hyperlipidemia, breast cancer who was brought to ED by her family  fro evaluation of weakness, worsening confusion, difficulty ambulating  at home.   On presentation, she was hemodynamically stable.  Lab work showed WC count of 12.6 UA not suspicious for UTI.  Chest x-ray did not show any acute findings.  MRI of the brain did not show any acute findings.  Patient admitted for PT/OT evaluation for possible placement to SNF.  Assessment & Plan:  Principal Problem:   Weakness   Weakness: Likely from deconditioning, advanced age.  PT/OT consulted.  This morning she appears deconditioned, weak but comfortable  Acute encephalopathy on the background of dementia: Likely from worsening delirium/cognitive impairment.  She has  underlying dementia.  MRI did not show any acute findings. Not suspicious for UTI.  Has mild leukocytosis but this was most likely reactive,now resolved.  Home Xanax discontinued. Continue delirium precautions, frequent reorientation  Hypokalemia: Supplement with potassium  AKI: Mild.  She is having good oral intake.  Check BMP tomorrow, no indication of IV fluid for now.  Hyperlipidemia: On statin at home  Hypertension: Currently taking amlodipine, hydrochlorothiazide at home.  Currently on hold.          DVT prophylaxis:enoxaparin (LOVENOX) injection 40 mg Start: 04/12/23 2200 SCDs Start: 04/12/23 2112     Code Status: Full Code  Family Communication: Discussed with son-in-law at bedside.  Discussed with daughter on phone on 12/3  Patient status: Inpatient  Patient is from : Home  Anticipated discharge to: Home versus SNF  Estimated DC date: Not sure   Consultants: None  Procedures: None  Antimicrobials:  Anti-infectives (From admission, onward)    None        Subjective: Patient seen and examined the bedside today.  She was eating her breakfast.  She looks comfortable.  She is oriented to place and knows current month.  Does not appear to be in any kind of distress.  Son-in-law was at bedside.  Denies any abdomen pain, dysuria, nausea, vomiting or shortness of breath.  Objective: Vitals:   04/13/23 0252 04/13/23 0343 04/13/23 0500 04/13/23 0801  BP: (!) 141/81 (!) 131/98 (!) 141/88 129/63  Pulse: 88 86 97 79  Resp: 18 18 20 17   Temp:  98.1 F (36.7 C) 97.6 F (36.4 C) 97.7 F (36.5 C)  TempSrc:  Oral Oral Oral  SpO2: 93% 98% 96% 98%  Weight:      Height:       No intake or output data in the 24 hours ending 04/13/23 0801 Filed Weights   04/12/23 1151  Weight: 56.2 kg    Examination:  General exam: Overall comfortable, not in distress, pleasant elderly female, deconditioned, weak HEENT: PERRL Respiratory system:  no wheezes or crackles  Cardiovascular system: S1 & S2 heard, RRR.  Gastrointestinal system: Abdomen is nondistended, soft and nontender. Central nervous system: Alert and awake, follows commands, oriented to place.  Knows current month Extremities: No edema, no clubbing ,no cyanosis Skin: No rashes, no ulcers,no icterus     Data Reviewed: I have personally reviewed following labs and imaging studies  CBC: Recent Labs  Lab 04/12/23 1219 04/13/23 0659  WBC 12.6* 8.0  HGB 13.3 12.8  HCT 41.8 39.0  MCV 92.3 90.1  PLT 228 208   Basic Metabolic Panel: Recent Labs  Lab 04/12/23 1219  NA 140  K 3.7  CL 109  CO2 23  GLUCOSE 109*  BUN 17  CREATININE 0.90  CALCIUM 9.3     Recent Results (from the past 240 hour(s))  Resp panel by RT-PCR (RSV, Flu A&B, Covid) Anterior Nasal Swab     Status: None   Collection Time: 04/12/23 11:55 AM   Specimen: Anterior Nasal Swab  Result Value Ref Range Status   SARS Coronavirus 2 by RT PCR NEGATIVE NEGATIVE Final   Influenza A by PCR NEGATIVE NEGATIVE Final    Influenza B by PCR NEGATIVE NEGATIVE Final    Comment: (NOTE) The Xpert Xpress SARS-CoV-2/FLU/RSV plus assay is intended as an aid in the diagnosis of influenza from Nasopharyngeal swab specimens and should not be used as a sole basis for treatment. Nasal washings and aspirates are unacceptable for Xpert Xpress SARS-CoV-2/FLU/RSV testing.  Fact Sheet for Patients: BloggerCourse.com  Fact Sheet for Healthcare Providers: SeriousBroker.it  This test is not yet approved or cleared by the [place] FDA and has been authorized for detection and/or diagnosis of SARS-CoV-2 by FDA under an Emergency Use Authorization (EUA). This EUA will remain in effect (meaning this test can be used) for the duration of the COVID-19 declaration under Section 564(b)(1) of the Act, 21 U.S.C. section 360bbb-3(b)(1), unless the authorization is terminated or revoked.     Resp Syncytial Virus by PCR NEGATIVE NEGATIVE Final    Comment: (NOTE) Fact Sheet for Patients: BloggerCourse.com  Fact Sheet for Healthcare Providers: SeriousBroker.it  This test is not yet approved or cleared by the [place] FDA and has been authorized for detection and/or diagnosis of SARS-CoV-2 by FDA under an Emergency Use Authorization (EUA). This EUA will remain in effect (meaning this test can be used) for the duration of the COVID-19 declaration under Section 564(b)(1) of the Act, 21 U.S.C. section 360bbb-3(b)(1), unless the authorization is terminated or revoked.  Performed at [facility], [address] [place], [place] [address]      Radiology Studies: MR BRAIN WO CONTRAST  Result Date: 04/12/2023 CLINICAL DATA:  Stroke suspected EXAM: MRI HEAD WITHOUT CONTRAST TECHNIQUE: Multiplanar, multiecho pulse sequences of the brain and surrounding structures were obtained without intravenous contrast. COMPARISON:   None Available. FINDINGS: Brain: No acute infarct, mass effect or extra-axial collection. No chronic microhemorrhage or siderosis. There is multifocal hyperintense T2-weighted signal within the white matter. Generalized volume loss. The midline structures are normal. Vascular: Normal flow voids. Skull and upper cervical spine: Normal marrow signal. Sinuses/Orbits: Negative. Other: None. IMPRESSION: 1. No acute intracranial abnormality. 2. Findings of chronic small vessel ischemia and volume loss. Electronically Signed   By: Deatra Robinson M.D.   On: 04/12/2023 19:26   DG Chest Portable 1 View  Result Date: 04/12/2023 CLINICAL DATA:  Cough. EXAM: PORTABLE CHEST 1 VIEW COMPARISON:  Chest radiograph dated January 27, 2010. FINDINGS: The heart size and mediastinal contours are within normal limits. Aortic atherosclerosis. Hyperinflation. No focal consolidation, pleural effusion, or pneumothorax. Surgical clips project over the left axilla. No acute osseous abnormality. IMPRESSION: No acute cardiopulmonary findings. Electronically Signed   By: Hart Robinsons M.D.   On: 04/12/2023 16:04    Scheduled Meds:  enoxaparin (LOVENOX) injection  40 mg Subcutaneous Q24H   pravastatin  40 mg Oral q1800   Continuous Infusions:   LOS: 1 day   Burnadette Pop, MD [facility] P12/07/2022, 8:01 AM

## 2023-04-13 NOTE — Progress Notes (Signed)
Inpatient Rehab Admissions Coordinator:   Per PT recommendations pt was screened for CIR by Estill Dooms, PT, DPT.  Pt could be a potential candidate for CIR. Will review for medical necessity with MD in the AM.    Estill Dooms, PT, DPT Admissions Coordinator [phone] 04/13/23  2:59 PM

## 2023-04-14 DIAGNOSIS — R531 Weakness: Secondary | ICD-10-CM | POA: Diagnosis not present

## 2023-04-14 LAB — BASIC METABOLIC PANEL
Anion gap: 8 (ref 5–15)
BUN: 13 mg/dL (ref 8–23)
CO2: 23 mmol/L (ref 22–32)
Calcium: 8.8 mg/dL — ABNORMAL LOW (ref 8.9–10.3)
Chloride: 108 mmol/L (ref 98–111)
Creatinine, Ser: 0.91 mg/dL (ref 0.44–1.00)
GFR, Estimated: 58 mL/min — ABNORMAL LOW (ref >60)
Glucose, Bld: 131 mg/dL — ABNORMAL HIGH (ref 70–99)
Potassium: 3.6 mmol/L (ref 3.5–5.1)
Sodium: 139 mmol/L (ref 135–145)

## 2023-04-14 LAB — MAGNESIUM: Magnesium: 2.1 mg/dL (ref 1.7–2.4)

## 2023-04-14 MED ORDER — TAMSULOSIN HCL 0.4 MG PO CAPS
0.4000 mg | ORAL_CAPSULE | Freq: Every day | ORAL | Status: DC
  Administered 2023-04-14 – 2023-04-20 (×7): 0.4 mg via ORAL
  Filled 2023-04-14 (×7): qty 1

## 2023-04-14 MED ORDER — ALPRAZOLAM 0.25 MG PO TABS
0.1250 mg | ORAL_TABLET | Freq: Two times a day (BID) | ORAL | Status: DC | PRN
  Administered 2023-04-14 – 2023-04-18 (×5): 0.125 mg via ORAL
  Filled 2023-04-14 (×6): qty 1

## 2023-04-14 MED ORDER — PANTOPRAZOLE SODIUM 40 MG PO TBEC
40.0000 mg | DELAYED_RELEASE_TABLET | Freq: Every day | ORAL | Status: DC
  Administered 2023-04-14 – 2023-04-20 (×7): 40 mg via ORAL
  Filled 2023-04-14 (×7): qty 1

## 2023-04-14 MED ORDER — AMLODIPINE BESYLATE 2.5 MG PO TABS
2.5000 mg | ORAL_TABLET | Freq: Every day | ORAL | Status: DC
  Administered 2023-04-14: 2.5 mg via ORAL
  Filled 2023-04-14: qty 1

## 2023-04-14 MED ORDER — HYDROCHLOROTHIAZIDE 25 MG PO TABS
25.0000 mg | ORAL_TABLET | Freq: Every day | ORAL | Status: DC
  Administered 2023-04-14: 25 mg via ORAL
  Filled 2023-04-14: qty 1

## 2023-04-14 MED ORDER — ACETAMINOPHEN 325 MG PO TABS
650.0000 mg | ORAL_TABLET | Freq: Four times a day (QID) | ORAL | Status: DC | PRN
  Administered 2023-04-15 (×2): 650 mg via ORAL
  Filled 2023-04-14 (×3): qty 2

## 2023-04-14 NOTE — Progress Notes (Signed)
  Inpatient Rehabilitation Admissions Coordinator   Met with patient at bedside for rehab assessment. No family present. I left a voicemail for her daughter, Aurea Graff , to call me to discuss her rehab venue options. Please call me with any questions.   Ottie Glazier, RN, MSN Rehab Admissions Coordinator [phone]

## 2023-04-14 NOTE — TOC Initial Note (Addendum)
Transition of Care [facility]) - Initial/Assessment Note    Patient Details  Name: Nancy Bell MRN: [id number] Date of Birth: Nov 22, 1924  [facility] CM/SW Contact:    Carley Hammed, LCSW Phone Number: 04/14/2023, 2:27 PM  Clinical Narrative:                 CSW attempted to meet with pt at bedside x 2, pt was receiving care and then was asleep. Pt with history of dementia, daughters are assisting with decision making. CSW spoke with Aurea Graff and [place] who noted interest in SNF with a preference for [facility]. Family agreeable to [facility] location and for additional referrals to be sent out. Pt has never been to SNF before, family agreeable to [facility] transport. Pt will need one more inpatient night stay prior to DC. Family concerned about pt's urinary retention, and if she will be medically ready, per MD note pt is medically ready. CSW to complete documentation and follow up with [facility] to determine bed availability. TOC will continue to follow.   2:50 PASRR went to level 2, requesting dementia primary note, requested MD sign and CSW will send in documentation for approval. TOC will continue to follow.  Expected Discharge Plan: Skilled Nursing Facility Barriers to Discharge: SNF Pending bed offer   Patient Goals and CMS Choice Patient states their goals for this hospitalization and ongoing recovery are:: Pt unable to participate in goal setting at this time. CMS Medicare.gov Compare Post Acute Care list provided to:: Patient Represenative (must comment) Choice offered to / list presented to : Adult Children      Expected Discharge Plan and Services In-house Referral: Clinical Social Work   Post Acute Care Choice: Skilled Nursing Facility Living arrangements for the past 2 months: Single Family Home                                      Prior Living Arrangements/Services Living arrangements for the past 2 months: Single Family Home Lives with:: Adult  Children Patient language and need for interpreter reviewed:: Yes Do you feel safe going back to the place where you live?: Yes      Need for Family Participation in Patient Care: Yes (Comment) Care giver support system in place?: Yes (comment) Current home services: DME Criminal Activity/Legal Involvement Pertinent to Current Situation/Hospitalization: No - Comment as needed  Activities of Daily Living   ADL Screening (condition at time of admission) Independently performs ADLs?: No Does the patient have a NEW difficulty with bathing/dressing/toileting/self-feeding that is expected to last >3 days?: Yes (Initiates electronic notice to provider for possible OT consult) (weak) Does the patient have a NEW difficulty with getting in/out of bed, walking, or climbing stairs that is expected to last >3 days?: Yes (Initiates electronic notice to provider for possible PT consult) (weak) Does the patient have a NEW difficulty with communication that is expected to last >3 days?: No Is the patient deaf or have difficulty hearing?: No Does the patient have difficulty seeing, even when wearing glasses/contacts?: No Does the patient have difficulty concentrating, remembering, or making decisions?: Yes  Permission Sought/Granted Permission sought to share information with : Family Supports Permission granted to share information with : Yes, Verbal Permission Granted  Share Information with NAME: Baltazar Najjar     Permission granted to share info w Relationship: Daughters     Emotional Assessment Appearance:: Appears stated  age Attitude/Demeanor/Rapport: Unable to Assess Affect (typically observed): Unable to Assess Orientation: : Oriented to Self, Oriented to Place, Oriented to Situation Alcohol / Substance Use: Not Applicable Psych Involvement: No (comment)  Admission diagnosis:  Weakness [R53.1] Patient Active Problem List   Diagnosis Date Noted   Weakness 04/12/2023   Intractable nausea  and vomiting 10/01/2022   Hypokalemia 10/01/2022   Age-related osteoporosis without current pathological fracture 09/30/2021   Anxiety disorder 09/30/2021   Dysphagia 09/30/2021   Essential hypertension 09/30/2021   Pure hypercholesterolemia 09/30/2021   Hyperglycemia 09/30/2021   Atherosclerosis of abdominal aorta (HCC) 09/30/2021   Cancer of left female breast (HCC) 09/30/2020   Hx breast cancer, IDC right LIQ,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  [address] Phone: [phone] Fax: [phone]     Social Determinants of Health (SDOH) Social History: SDOH Screenings   Food Insecurity: No Food Insecurity (04/12/2023)  Housing: Low Risk  (04/12/2023)  Transportation Needs: No Transportation Needs (04/12/2023)  Utilities: Not At Risk (04/12/2023)  Tobacco Use: Low Risk  (04/12/2023)   SDOH Interventions:     Readmission Risk Interventions     No data to display

## 2023-04-14 NOTE — Progress Notes (Signed)
PROGRESS NOTE  MORGANN TELEP  WUJ:[id number] DOB: [birth year] DOA: 04/12/2023 PCP: Darrow Bussing, MD   Brief Narrative: Patient is a 89 year old female with history of hypertension, hyperlipidemia, breast cancer who was brought to ED by her family  fro evaluation of weakness, worsening confusion, difficulty ambulating  at home.   On presentation, she was hemodynamically stable.  Lab work showed WC count of 12.6 UA not suspicious for UTI.  Chest x-ray did not show any acute findings.  MRI of the brain did not show any acute findings.  Patient admitted for PT/OT evaluation for possible placement to SNF.  Current recommendation is skilled nursing facility.  Medically stable for discharge whenever bed is available.  Assessment & Plan:  Principal Problem:   Weakness   Weakness: Likely from deconditioning, advanced age.  PT/OT consulted.  This morning she appears deconditioned, weak but comfortable  Acute encephalopathy on the background of dementia: Likely from worsening delirium/cognitive impairment.  She has  underlying dementia.  MRI did not show any acute findings. Not suspicious for UTI.  Has mild leukocytosis but this was most likely reactive,now resolved. Continue delirium precautions, frequent reorientation  Hypokalemia: Supplemented and corrected  AKI/urinary retention: AKI resolved but she was found to have urinary retention . In and out  cath performed with removal of 1200 mL of urine.  Started on Flomax.  She might need Foley if straight and second.  Check BMP tomorrow.  Hyperlipidemia: On statin at home  Hypertension: Previously taking amlodipine, hydrochlorothiazide at home.  Since trended up, restarted these medications         DVT prophylaxis:enoxaparin (LOVENOX) injection 30 mg Start: 04/13/23 2200 SCDs Start: 04/12/23 2112     Code Status: Full Code  Family Communication: Discussed with son-in-law at bedside.  Discussed with daughter on phone on 12/3  Patient  status: Inpatient  Patient is from : Home  Anticipated discharge to: SNF  Estimated DC date: whenever possible   Consultants: None  Procedures: None  Antimicrobials:  Anti-infectives (From admission, onward)    None       Subjective: Patient seen and examined at bedside today.  She was lying on bed.  Appears overall comfortable. She denied any new complaints to me.  She is alert, awake and follows commands but confused to time.  Objective: Vitals:   04/13/23 0801 04/13/23 2004 04/14/23 0333 04/14/23 0826  BP: 129/63 (!) 156/84 (!) 168/83 (!) 162/81  Pulse: 79 91 88 87  Resp: 17 16  17   Temp: 97.7 F (36.5 C) 98 F (36.7 C) (!) 97.5 F (36.4 C) (!) 97.5 F (36.4 C)  TempSrc: Oral Oral Oral   SpO2: 98% 99% 100% 96%  Weight:      Height:        Intake/Output Summary (Last 24 hours) at 04/14/2023 1250 Last data filed at 04/14/2023 0351 Gross per 24 hour  Intake 100 ml  Output 50 ml  Net 50 ml   Filed Weights   04/12/23 1151  Weight: 56.2 kg    Examination:  General exam: Overall comfortable, not in distress, pleasantly confused elderly female, deconditioned HEENT: PERRL Respiratory system:  no wheezes or crackles  Cardiovascular system: S1 & S2 heard, RRR.  Gastrointestinal system: Abdomen is nondistended, soft and nontender. Central nervous system: Alert and awake ,oriented to place only Extremities: No edema, no clubbing ,no cyanosis Skin: No rashes, no ulcers,no icterus     Data Reviewed: I have personally reviewed following labs and imaging studies  CBC: Recent Labs  Lab 04/12/23 1219 04/13/23 0659  WBC 12.6* 8.0  HGB 13.3 12.8  HCT 41.8 39.0  MCV 92.3 90.1  PLT 228 208   Basic Metabolic Panel: Recent Labs  Lab 04/12/23 1219 04/13/23 0659 04/14/23 0631  NA 140 138 139  K 3.7 3.1* 3.6  CL 109 104 108  CO2 23 22 23   GLUCOSE 109* 114* 131*  BUN 17 12 13   CREATININE 0.90 1.30* 0.91  CALCIUM 9.3 8.6* 8.8*  MG  --   --  2.1      Recent Results (from the past 240 hour(s))  Resp panel by RT-PCR (RSV, Flu A&B, Covid) Anterior Nasal Swab     Status: None   Collection Time: 04/12/23 11:55 AM   Specimen: Anterior Nasal Swab  Result Value Ref Range Status   SARS Coronavirus 2 by RT PCR NEGATIVE NEGATIVE Final   Influenza A by PCR NEGATIVE NEGATIVE Final   Influenza B by PCR NEGATIVE NEGATIVE Final    Comment: (NOTE) The Xpert Xpress SARS-CoV-2/FLU/RSV plus assay is intended as an aid in the diagnosis of influenza from Nasopharyngeal swab specimens and should not be used as a sole basis for treatment. Nasal washings and aspirates are unacceptable for Xpert Xpress SARS-CoV-2/FLU/RSV testing.  Fact Sheet for Patients: BloggerCourse.com  Fact Sheet for Healthcare Providers: SeriousBroker.it  This test is not yet approved or cleared by the [place] FDA and has been authorized for detection and/or diagnosis of SARS-CoV-2 by FDA under an Emergency Use Authorization (EUA). This EUA will remain in effect (meaning this test can be used) for the duration of the COVID-19 declaration under Section 564(b)(1) of the Act, 21 U.S.C. section 360bbb-3(b)(1), unless the authorization is terminated or revoked.     Resp Syncytial Virus by PCR NEGATIVE NEGATIVE Final    Comment: (NOTE) Fact Sheet for Patients: BloggerCourse.com  Fact Sheet for Healthcare Providers: SeriousBroker.it  This test is not yet approved or cleared by the [place] FDA and has been authorized for detection and/or diagnosis of SARS-CoV-2 by FDA under an Emergency Use Authorization (EUA). This EUA will remain in effect (meaning this test can be used) for the duration of the COVID-19 declaration under Section 564(b)(1) of the Act, 21 U.S.C. section 360bbb-3(b)(1), unless the authorization is terminated or revoked.  Performed at [facility], [address] [place], [place] [address]      Radiology Studies: MR BRAIN WO CONTRAST  Result Date: 04/12/2023 CLINICAL DATA:  Stroke suspected EXAM: MRI HEAD WITHOUT CONTRAST TECHNIQUE: Multiplanar, multiecho pulse sequences of the brain and surrounding structures were obtained without intravenous contrast. COMPARISON:  None Available. FINDINGS: Brain: No acute infarct, mass effect or extra-axial collection. No chronic microhemorrhage or siderosis. There is multifocal hyperintense T2-weighted signal within the white matter. Generalized volume loss. The midline structures are normal. Vascular: Normal flow voids. Skull and upper cervical spine: Normal marrow signal. Sinuses/Orbits: Negative. Other: None. IMPRESSION: 1. No acute intracranial abnormality. 2. Findings of chronic small vessel ischemia and volume loss. Electronically Signed   By: Deatra Robinson M.D.   On: 04/12/2023 19:26   DG Chest Portable 1 View  Result Date: 04/12/2023 CLINICAL DATA:  Cough. EXAM: PORTABLE CHEST 1 VIEW COMPARISON:  Chest radiograph dated January 27, 2010. FINDINGS: The heart size and mediastinal contours are within normal limits. Aortic atherosclerosis. Hyperinflation. No focal consolidation, pleural effusion, or pneumothorax. Surgical clips project over the left axilla. No acute osseous abnormality. IMPRESSION: No acute cardiopulmonary findings.  Electronically Signed   By: Hart Robinsons M.D.   On: 04/12/2023 16:04    Scheduled Meds:  amLODipine  2.5 mg Oral Daily   enoxaparin (LOVENOX) injection  30 mg Subcutaneous Q24H   feeding supplement  237 mL Oral BID BM   hydrochlorothiazide  25 mg Oral Daily   pantoprazole  40 mg Oral Daily   pravastatin  40 mg Oral q1800   tamsulosin  0.4 mg Oral Daily   Continuous Infusions:   LOS: 2 days   Burnadette Pop, MD [facility] P12/08/2022, 12:50 PM

## 2023-04-14 NOTE — NC FL2 (Signed)
[place] MEDICAID FL2 LEVEL OF CARE FORM     IDENTIFICATION  Patient Name: Nancy Bell Birthdate: 09/10/1924 Sex: female Admission Date (Current Location): 04/12/2023  [facility] and [place] Number:  Producer, television/film/video and Address:  [facility], [address] [place], [place] [address]      Provider Number: [id number]  Attending Physician Name and Address:  Burnadette Pop, MD  Relative Name and Phone Number:       Current Level of Care: Hospital Recommended Level of Care: Skilled Nursing Facility Prior Approval Number:    Date Approved/Denied:   PASRR Number: Pending Level 2  Discharge Plan: SNF    Current Diagnoses: Patient Active Problem List   Diagnosis Date Noted   Weakness 04/12/2023   Intractable nausea and vomiting 10/01/2022   Hypokalemia 10/01/2022   Age-related osteoporosis without current pathological fracture 09/30/2021   Anxiety disorder 09/30/2021   Dysphagia 09/30/2021   Essential hypertension 09/30/2021   Pure hypercholesterolemia 09/30/2021   Hyperglycemia 09/30/2021   Atherosclerosis of abdominal aorta (HCC) 09/30/2021   Cancer of left female breast (HCC) 09/30/2020   Hx breast cancer, IDC right LIQ,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    Orientation RESPIRATION BLADDER Height & Weight     Self, Place  Normal Incontinent Weight: 124 lb (56.2 kg) Height:  5\' 2"  (157.5 cm)  BEHAVIORAL SYMPTOMS/MOOD NEUROLOGICAL BOWEL NUTRITION STATUS      Incontinent Diet (See DC summary)  AMBULATORY STATUS COMMUNICATION OF NEEDS Skin   Extensive Assist Verbally Normal                       Personal Care Assistance Level of Assistance  Bathing, Feeding, Dressing Bathing Assistance: Maximum assistance Feeding assistance: Limited assistance Dressing Assistance: Maximum assistance     Functional Limitations Info  Sight, Hearing, Speech Sight Info: Adequate Hearing Info: Adequate Speech Info: Adequate    SPECIAL  CARE FACTORS FREQUENCY  PT (By licensed PT), OT (By licensed OT)     PT Frequency: 5x week OT Frequency: 5x week            Contractures Contractures Info: Not present    Additional Factors Info  Code Status, Allergies Code Status Info: Full Allergies Info: Augmentin (Amoxicillin-pot Clavulanate)  Vicodin (Hydrocodone-acetaminophen)  Oxycontin (Oxycodone Hcl)  Tylenol (Acetaminophen)  Ultram (Tramadol Hcl)           Current Medications (04/14/2023):  This is the current hospital active medication list Current Facility-Administered Medications  Medication Dose Route Frequency Provider Last Rate Last Admin   acetaminophen (TYLENOL) tablet 650 mg  650 mg Oral Q6H PRN Burnadette Pop, MD       ALPRAZolam Prudy Feeler) tablet 0.125 mg  0.125 mg Oral BID PRN Burnadette Pop, MD   0.125 mg at 04/14/23 1151   amLODipine (NORVASC) tablet 2.5 mg  2.5 mg Oral Daily Adhikari, Amrit, MD   2.5 mg at 04/14/23 1153   enoxaparin (LOVENOX) injection 30 mg  30 mg Subcutaneous Q24H Adhikari, Amrit, MD   30 mg at 04/13/23 2030   feeding supplement (ENSURE ENLIVE / ENSURE PLUS) liquid 237 mL  237 mL Oral BID BM Adhikari, Amrit, MD   237 mL at 04/14/23 1500   hydrochlorothiazide (HYDRODIURIL) tablet 25 mg  25 mg Oral Daily Burnadette Pop, MD   25 mg at 04/14/23 1153   ondansetron (ZOFRAN) tablet 4 mg  4 mg Oral Q6H PRN Alan Mulder, MD  Or   ondansetron (ZOFRAN) injection 4 mg  4 mg Intravenous Q6H PRN Alan Mulder, MD   4 mg at 04/13/23 2038   pantoprazole (PROTONIX) EC tablet 40 mg  40 mg Oral Daily Adhikari, Amrit, MD   40 mg at 04/14/23 1340   pravastatin (PRAVACHOL) tablet 40 mg  40 mg Oral q1800 Alan Mulder, MD   40 mg at 04/13/23 2151   tamsulosin (FLOMAX) capsule 0.4 mg  0.4 mg Oral Daily Burnadette Pop, MD   0.4 mg at 04/14/23 1347   traZODone (DESYREL) tablet 50 mg  50 mg Oral QHS PRN Alan Mulder, MD   50 mg at 04/13/23 2241     Discharge Medications: Please see discharge  summary for a list of discharge medications.  Relevant Imaging Results:  Relevant Lab Results:   Additional Information SS# [id number]  Carley Hammed, [place]

## 2023-04-14 NOTE — Social Work (Signed)
TOC Dementia Note   Patient Details  Name: Nancy Bell Date of Birth: [birth year] 04/14/2023, 2:52 PM   To Whom It May Concern:  Please be advised that the above-named patient has a primary diagnosis of dementia which supersedes any psychiatric diagnosis.   Transition of Care [facility]) CM/SW Contact: Carley Hammed, LCSW Phone Number: 04/14/2023, 2:52 PM

## 2023-04-14 NOTE — Progress Notes (Signed)
   04/14/23 1533  Mobility  Activity Stood at bedside;Turned to right side;Turned to left side  Level of Assistance +2 (takes two people)  Assistive Device None  Range of Motion/Exercises Right arm;Left arm;Right leg;Left leg  Activity Response Tolerated fair  Mobility Referral Yes  $Mobility charge 1 Mobility  Mobility Specialist Start Time (ACUTE ONLY) 1507  Mobility Specialist Stop Time (ACUTE ONLY) 1533  Mobility Specialist Time Calculation (min) (ACUTE ONLY) 26 min   Mobility Specialist: Progress Note  Pt agreeable to mobility session - received in bed. Required Max+2 with no AD. Attempt to sit on BSC, pt was unable. Pt with with stool on bed pad- assisted with pericare by NT. PT with facial grimaces, was able to sit EOB on her own. Returned to bed with all needs met - call bell within reach. Family present.   Barnie Mort, BS Mobility Specialist Please contact via SecureChat or Rehab office at [phone].

## 2023-04-14 NOTE — Plan of Care (Signed)

## 2023-04-14 NOTE — Care Management Important Message (Signed)
Important Message  Patient Details  Name: Nancy Bell MRN: [id number] Date of Birth: [birth year]   Important Message Given:  Yes - Medicare IM     Sherilyn Banker 04/14/2023, 2:19 PM

## 2023-04-14 NOTE — Progress Notes (Signed)
Inpatient Rehabilitation Admissions Coordinator   I met at bedside with her two daughters. Dr Riley Kill reviewed case and feels she lacks the medical neccesity for AIR level rehab. OT recommends SNF today due to low tolerance. We will not pursue CIR at this time and daughters are aware. I will alert acute team and TOC. We will sign off.  Ottie Glazier, RN, MSN Rehab Admissions Coordinator [phone] 04/14/2023 11:49 AM

## 2023-04-14 NOTE — Progress Notes (Signed)
Patient complained of lower abdominal pain that she rated a 10 out of 10.  I assessed her abdomen and noticed it was very distended. Bladder scan was 858.  Notified MD and and did an intermittent catheter.  Drained 1200 ml of yellow clear urine from patient's bladder.  This resolved the patient's pain and she was able to rest comfortably.

## 2023-04-14 NOTE — Evaluation (Signed)
Occupational Therapy Evaluation Patient Details Name: Nancy Bell MRN: [id number] DOB: [birth year] Today's Date: 04/14/2023   History of Present Illness 89 yo presents with confusion and difficulty walking MRI negative PMH HTN HLD anxiety breast CA  total mastectomy   Clinical Impression   Prior to this admission, patient living with her son-in law and daughter, independent in ADLs, but no longer driving and does not manage her medications. Patient typically independent without AD. Currently, patient presenting with minimal confusion, tremors, nausea and dizziness. Patient with consistent tremors (though has some baseline tremors) therefore unable to leave patient EOB to get a seated BP. Patient with significant weakness, requiring max A to sit EOB (though able to maintain balance with CGA) and then max A to return. Patient with nausea sitting EOB (RN informed) and unable to progress with further functional mobility. Given patient's current level, this OT does not feel that the patient can withstand intensive therapies > 3 hours. OT recommending lesser intensity rehab < 3 hours in order to return to prior level of function. OT will follow.      If plan is discharge home, recommend the following: Two people to help with walking and/or transfers;A lot of help with bathing/dressing/bathroom;Direct supervision/assist for medications management;Direct supervision/assist for financial management;Assist for transportation;Help with stairs or ramp for entrance;Supervision due to cognitive status    Functional Status Assessment  Patient has had a recent decline in their functional status and demonstrates the ability to make significant improvements in function in a reasonable and predictable amount of time.  Equipment Recommendations  Other (comment) (defer to next venue)    Recommendations for Other Services       Precautions / Restrictions Precautions Precautions: Fall Restrictions Weight  Bearing Restrictions: No      Mobility Bed Mobility Overal bed mobility: Needs Assistance Bed Mobility: Rolling, Sit to Supine, Supine to Sit Rolling: Max assist   Supine to sit: Max assist Sit to supine: Max assist   General bed mobility comments: Max A in order to complete and increased time to sequence all movements. Increased tremors and nausea sitting EOB, unable to progress into standing this date    Transfers Overall transfer level: Needs assistance                 General transfer comment: unable to progress due to tremors, nausea and fatigue      Balance Overall balance assessment: Needs assistance Sitting-balance support: Feet supported, Bilateral upper extremity supported Sitting balance-Leahy Scale: Fair Sitting balance - Comments: able to maintain static sitting balance with close supervision                                   ADL either performed or assessed with clinical judgement   ADL Overall ADL's : Needs assistance/impaired Eating/Feeding: Set up;Sitting   Grooming: Set up;Sitting   Upper Body Bathing: Minimal assistance;Sitting   Lower Body Bathing: Maximal assistance;Total assistance;Sitting/lateral leans;Sit to/from stand   Upper Body Dressing : Minimal assistance;Sitting   Lower Body Dressing: Maximal assistance;Total assistance;Sit to/from stand;Sitting/lateral leans   Toilet Transfer: Maximal assistance;Stand-pivot;BSC/3in1   Toileting- Clothing Manipulation and Hygiene: Maximal assistance;Sit to/from stand;Sitting/lateral lean       Functional mobility during ADLs: Moderate assistance;Rolling walker (2 wheels);Cueing for sequencing;Cueing for safety General ADL Comments: Patient presenting with minimal confusion, tremors, nausea and dizziness. Patient with consistent tremors (though has some baseline tremors) therefore unable to leave patient  EOB to get a seated BP. Patient with significant weakness, requiring max A to  sit EOB (though able to maintain balance with CGA) and then max A to return. Patient with nausea sitting EOB (RN informed) and unable to progress with further functional mobility. Given patient's current level, this OT does not feel that the patient can withstand intensive therapies > 3 hours. OT recommending lesser intensity rehab < 3 hours in order to return to prior level of function. OT will follow.     Vision Baseline Vision/History: 0 No visual deficits Ability to See in Adequate Light: 0 Adequate Patient Visual Report: No change from baseline Vision Assessment?: No apparent visual deficits     Perception Perception: Not tested       Praxis Praxis: Not tested       Pertinent Vitals/Pain Pain Assessment Pain Assessment: Faces Faces Pain Scale: Hurts even more Pain Location: generalized with all movement Pain Descriptors / Indicators: Discomfort, Guarding, Grimacing Pain Intervention(s): Limited activity within patient's tolerance, Monitored during session, Repositioned     Extremity/Trunk Assessment Upper Extremity Assessment Upper Extremity Assessment: Generalized weakness   Lower Extremity Assessment Lower Extremity Assessment: Defer to PT evaluation   Cervical / Trunk Assessment Cervical / Trunk Assessment: Kyphotic   Communication Communication Communication: Difficulty following commands/understanding Following commands: Follows one step commands with increased time Cueing Techniques: Tactile cues;Visual cues   Cognition Arousal: Alert Behavior During Therapy: WFL for tasks assessed/performed Overall Cognitive Status: Impaired/Different from baseline Area of Impairment: Memory, Attention, Following commands, Safety/judgement, Awareness, Problem solving                   Current Attention Level: Sustained Memory: Decreased recall of precautions Following Commands: Follows one step commands with increased time Safety/Judgement: Decreased awareness of  safety Awareness: Emergent Problem Solving: Difficulty sequencing, Requires verbal cues, Requires tactile cues General Comments: Patient oriented, however requiring increased time to follow all directions and answer basic questions     General Comments       Exercises     Shoulder Instructions      Home Living Family/patient expects to be discharged to:: Private residence Living Arrangements: Children (daughter and son in law) Available Help at Discharge: Family;Available 24 hours/day Type of Home: House Home Access: Stairs to enter [employer] of Steps: 4 Entrance Stairs-Rails: Right;Left;Can reach both Home Layout: One level     Bathroom Shower/Tub: Producer, television/film/video: Standard Bathroom Accessibility: Yes   Home Equipment: Hand held shower head;Shower seat;Grab bars - toilet   Additional Comments: was independent without AD prior to admission      Prior Functioning/Environment Prior Level of Function : Needs assist             Mobility Comments: ambulates household distances  without AD ADLs Comments: independent in ADLs, family assists with iADLs, including medication management        OT Problem List: Decreased range of motion;Decreased activity tolerance;Impaired balance (sitting and/or standing);Decreased coordination;Decreased cognition;Decreased safety awareness;Decreased knowledge of use of DME or AE;Pain      OT Treatment/Interventions: Self-care/ADL training;Therapeutic exercise;Energy conservation;Manual therapy;Modalities;Patient/family education;Balance training    OT Goals(Current goals can be found in the care plan section) Acute Rehab OT Goals Patient Stated Goal: to get warm OT Goal Formulation: With patient Time For Goal Achievement: 04/28/23 Potential to Achieve Goals: Fair ADL Goals Pt Will Perform Lower Body Bathing: with min assist;sitting/lateral leans;sit to/from stand Pt Will Perform Lower Body Dressing:  with min assist;sit  to/from stand;sitting/lateral leans Pt Will Transfer to Toilet: with min assist;ambulating;regular height toilet Pt Will Perform Toileting - Clothing Manipulation and hygiene: with min assist;sit to/from stand;sitting/lateral leans Additional ADL Goal #1: Patient will be able to complete functional task in standing for 2-3 minutes prior to needing seated rest break in order increase activty tolerance.  OT Frequency: Min 1X/week    Co-evaluation              AM-PAC OT "6 Clicks" Daily Activity     Outcome Measure Help from another person eating meals?: A Little Help from another person taking care of personal grooming?: A Little Help from another person toileting, which includes using toliet, bedpan, or urinal?: A Lot Help from another person bathing (including washing, rinsing, drying)?: A Lot Help from another person to put on and taking off regular upper body clothing?: A Little Help from another person to put on and taking off regular lower body clothing?: A Lot 6 Click Score: 15   End of Session Nurse Communication: Mobility status;Other (comment) (Nausea meds)  Activity Tolerance: Patient limited by fatigue;Patient limited by lethargy (Nausea) Patient left: in bed;with call bell/phone within reach;with bed alarm set  OT Visit Diagnosis: Unsteadiness on feet (R26.81);Other abnormalities of gait and mobility (R26.89);Muscle weakness (generalized) (M62.81);Other symptoms and signs involving cognitive function;Adult, failure to thrive (R62.7);Pain                Time: 6962-9528 OT Time Calculation (min): 16 min Charges:  OT General Charges $OT Visit: 1 Visit OT Evaluation $OT Eval Moderate Complexity: 1 Mod  Pollyann Glen E. Darian Ace, OTR/L Acute Rehabilitation Services [phone]   Cherlyn Cushing 04/14/2023, 10:53 AM

## 2023-04-15 DIAGNOSIS — R531 Weakness: Secondary | ICD-10-CM | POA: Diagnosis not present

## 2023-04-15 LAB — BASIC METABOLIC PANEL
Anion gap: 8 (ref 5–15)
BUN: 18 mg/dL (ref 8–23)
CO2: 24 mmol/L (ref 22–32)
Calcium: 8.6 mg/dL — ABNORMAL LOW (ref 8.9–10.3)
Chloride: 105 mmol/L (ref 98–111)
Creatinine, Ser: 0.89 mg/dL (ref 0.44–1.00)
GFR, Estimated: 59 mL/min — ABNORMAL LOW (ref >60)
Glucose, Bld: 96 mg/dL (ref 70–99)
Potassium: 3.3 mmol/L — ABNORMAL LOW (ref 3.5–5.1)
Sodium: 137 mmol/L (ref 135–145)

## 2023-04-15 LAB — GLUCOSE, CAPILLARY: Glucose-Capillary: 196 mg/dL — ABNORMAL HIGH (ref 70–99)

## 2023-04-15 MED ORDER — POTASSIUM CHLORIDE CRYS ER 20 MEQ PO TBCR
40.0000 meq | EXTENDED_RELEASE_TABLET | Freq: Once | ORAL | Status: AC
  Administered 2023-04-15: 40 meq via ORAL
  Filled 2023-04-15: qty 2

## 2023-04-15 MED ORDER — SODIUM CHLORIDE 0.9 % IV SOLN: INTRAVENOUS | Status: DC

## 2023-04-15 MED ORDER — POTASSIUM CHLORIDE CRYS ER 20 MEQ PO TBCR: 40.0000 meq | EXTENDED_RELEASE_TABLET | Freq: Once | ORAL | Status: DC

## 2023-04-15 NOTE — Plan of Care (Signed)

## 2023-04-15 NOTE — Progress Notes (Addendum)
PROGRESS NOTE  Nancy Bell  ZOX:[id number] DOB: [birth year] DOA: 04/12/2023 PCP: Darrow Bussing, MD   Brief Narrative: Patient is a 89 year old female with history of hypertension, hyperlipidemia, breast cancer who was brought to ED by her family  fro evaluation of weakness, worsening confusion, difficulty ambulating  at home.   On presentation, she was hemodynamically stable.  Lab work showed WC count of 12.6 UA not suspicious for UTI.  Chest x-ray did not show any acute findings.  MRI of the brain did not show any acute findings.  Patient admitted for PT/OT evaluation for possible placement to SNF.  Current recommendation is skilled nursing facility.  Hospital course remarkable for urine retention, poor oral intake.  Started on gentle IV fluids  Assessment & Plan:  Principal Problem:   Weakness   Weakness: Likely from deconditioning, advanced age.But its sudden decline.  She was walking until few days ago before admission.  PT/OT consulted.  Recommendation is for skilled nursing facility on discharge.TOC following  Acute encephalopathy on the background of dementia: Likely from worsening delirium/cognitive impairment.  She has  underlying dementia.  MRI did not show any acute findings. UA was not  suspicious for UTI.  Has mild leukocytosis but this was most likely reactive,now resolved. Continue delirium precautions, frequent reorientation. Oriented to place and month.  Not agitated.  Hypokalemia: Supplemented   AKI/urinary retention: AKI resolved but she was found to have urinary retention . In and out  cath performed with removal of 1200 mL of urine on 12/4.  Started on Flomax.  Bladder scan checked again today showed retention with urine of about 500 mL.  Will place Foley catheter. Will give voiding trial in the 1-2 days.  If she continues to retain, she need to be discharged on Foley catheter and follow-up with urology as an outpatient  Poor oral intake: Encourage oral intake.   After discussion with family, will start on gentle IV fluids.  Kidney function is stable, no elevated BUN.Dietician consulted  Hyperlipidemia: On statin at home  Hypertension: Previously taking amlodipine, hydrochlorothiazide at home. But not taking now, blood pressure soft but stable this morning.  History of left breast cancer: Status post mastectomy, currently in remission       DVT prophylaxis:enoxaparin (LOVENOX) injection 30 mg Start: 04/13/23 2200 SCDs Start: 04/12/23 2112     Code Status: Full Code  Family Communication: Discussed with daughter,son-in-law at bedside on 12/5  Patient status: Inpatient  Patient is from : Home  Anticipated discharge to: SNF  Estimated DC date: 1-2 days   Consultants: None  Procedures: None  Antimicrobials:  Anti-infectives (From admission, onward)    None       Subjective: Patient seen and examined at bedside today.  Hemodynamically stable.  She looked more comfortable than yesterday.  She was alert and awake, obeys commands, does not look in any kind of distress.  Had problem  with urinary retention yesterday.  Bladder scan again showing about 500 mL.  Patient does not have any nausea, vomiting or abdominal pain today. Long discussion held with the family at bedside.  They are concerned about her poor oral intake.  Will go ahead and start  on IV fluids.  Patient does not look dehydrated though  Objective: Vitals:   04/14/23 0826 04/14/23 1605 04/14/23 2059 04/15/23 0841  BP: (!) 162/81 121/68 (!) 97/48 108/63  Pulse: 87 67 99 92  Resp: 17 17 20 18   Temp: (!) 97.5 F (36.4 C) 97.8 F (36.6  C) (!) 97.4 F (36.3 C) 98.7 F (37.1 C)  TempSrc:  Oral Oral Oral  SpO2: 96% 99% 97% 93%  Weight:      Height:        Intake/Output Summary (Last 24 hours) at 04/15/2023 1258 Last data filed at 04/15/2023 1045 Gross per 24 hour  Intake 480 ml  Output --  Net 480 ml   Filed Weights   04/12/23 1151  Weight: 56.2 kg     Examination:  General exam: Overall comfortable, not in distress, weak, deconditioned HEENT: PERRL Respiratory system:  no wheezes or crackles  Cardiovascular system: S1 & S2 heard, RRR.  Gastrointestinal system: Abdomen is nondistended, soft and nontender. Central nervous system: Alert and oriented to place, knows current month Extremities: No edema, no clubbing ,no cyanosis Skin: No rashes, no ulcers,no icterus      Data Reviewed: I have personally reviewed following labs and imaging studies  CBC: Recent Labs  Lab 04/12/23 1219 04/13/23 0659  WBC 12.6* 8.0  HGB 13.3 12.8  HCT 41.8 39.0  MCV 92.3 90.1  PLT 228 208   Basic Metabolic Panel: Recent Labs  Lab 04/12/23 1219 04/13/23 0659 04/14/23 0631 04/15/23 0518  NA 140 138 139 137  K 3.7 3.1* 3.6 3.3*  CL 109 104 108 105  CO2 23 22 23 24   GLUCOSE 109* 114* 131* 96  BUN 17 12 13 18   CREATININE 0.90 1.30* 0.91 0.89  CALCIUM 9.3 8.6* 8.8* 8.6*  MG  --   --  2.1  --      Recent Results (from the past 240 hour(s))  Resp panel by RT-PCR (RSV, Flu A&B, Covid) Anterior Nasal Swab     Status: None   Collection Time: 04/12/23 11:55 AM   Specimen: Anterior Nasal Swab  Result Value Ref Range Status   SARS Coronavirus 2 by RT PCR NEGATIVE NEGATIVE Final   Influenza A by PCR NEGATIVE NEGATIVE Final   Influenza B by PCR NEGATIVE NEGATIVE Final    Comment: (NOTE) The Xpert Xpress SARS-CoV-2/FLU/RSV plus assay is intended as an aid in the diagnosis of influenza from Nasopharyngeal swab specimens and should not be used as a sole basis for treatment. Nasal washings and aspirates are unacceptable for Xpert Xpress SARS-CoV-2/FLU/RSV testing.  Fact Sheet for Patients: BloggerCourse.com  Fact Sheet for Healthcare Providers: SeriousBroker.it  This test is not yet approved or cleared by the [place] FDA and has been authorized for detection and/or diagnosis of  SARS-CoV-2 by FDA under an Emergency Use Authorization (EUA). This EUA will remain in effect (meaning this test can be used) for the duration of the COVID-19 declaration under Section 564(b)(1) of the Act, 21 U.S.C. section 360bbb-3(b)(1), unless the authorization is terminated or revoked.     Resp Syncytial Virus by PCR NEGATIVE NEGATIVE Final    Comment: (NOTE) Fact Sheet for Patients: BloggerCourse.com  Fact Sheet for Healthcare Providers: SeriousBroker.it  This test is not yet approved or cleared by the [place] FDA and has been authorized for detection and/or diagnosis of SARS-CoV-2 by FDA under an Emergency Use Authorization (EUA). This EUA will remain in effect (meaning this test can be used) for the duration of the COVID-19 declaration under Section 564(b)(1) of the Act, 21 U.S.C. section 360bbb-3(b)(1), unless the authorization is terminated or revoked.  Performed at [facility], [address], [place], [place] [address]      Radiology Studies: No results found.  Scheduled Meds:  enoxaparin (LOVENOX) injection  30 mg Subcutaneous Q24H   feeding supplement  237 mL Oral BID BM   pantoprazole  40 mg Oral Daily   pravastatin  40 mg Oral q1800   tamsulosin  0.4 mg Oral Daily   Continuous Infusions:  sodium chloride       LOS: 3 days   Burnadette Pop, MD [facility] P12/09/2022, 12:58 PM

## 2023-04-15 NOTE — Social Work (Signed)
Please be advised that the above-named patient will require a short-term nursing home stay-anticipated 30 days or less for rehabilitation and strengthening. The plan is for return home.  

## 2023-04-15 NOTE — TOC Progression Note (Addendum)
Transition of Care [facility]) - Progression Note    Patient Details  Name: Nancy Bell MRN: [id number] Date of Birth: 10/16/1924  [facility]) CM/SW Contact  Carley Hammed, LCSW Phone Number: 04/15/2023, 11:30 AM  Clinical Narrative:    CSW met with family at bedside to provide bed offers. CSW advised that [facility] currently does not have any beds, but admissions director stated to give family her number and they can assist with arranging a transfer if a bed opens up. Medicare.gov list provided and all questions answered. CSW noted family can call to advise of choice or to have any additional questions answered, they noted understanding. Daughters state they spoke with MD this morning and pt needs continued medical workup prior to being ready to DC. CSW uploaded PASRR documentation and will continue to follow for approval. TOC will continue to follow to assist with DC needs.    2:00 PASRR approved [id number] E. Family requested CSW follow up with [facility] to see if they can offer.  Expected Discharge Plan: Skilled Nursing Facility Barriers to Discharge: SNF Pending bed offer  Expected Discharge Plan and Services In-house Referral: Clinical Social Work   Post Acute Care Choice: Skilled Nursing Facility Living arrangements for the past 2 months: Single Family Home                                       Social Determinants of Health (SDOH) Interventions SDOH Screenings   Food Insecurity: No Food Insecurity (04/12/2023)  Housing: Low Risk  (04/12/2023)  Transportation Needs: No Transportation Needs (04/12/2023)  Utilities: Not At Risk (04/12/2023)  Tobacco Use: Low Risk  (04/12/2023)    Readmission Risk Interventions     No data to display

## 2023-04-15 NOTE — Progress Notes (Signed)
Physical Therapy Treatment Patient Details Name: Nancy Bell MRN: [id number] DOB: February 27, 1925 Today's Date: 04/15/2023   History of Present Illness 89 yo presents with confusion and difficulty walking MRI negative PMH HTN HLD anxiety breast CA  total mastectomy    PT Comments  Received pt semi-reclined in bed with RN and family at bedside. Pt performed bed mobility with supervision using bed features and able to follow commands for reciprocal scooting along EOB. Pt performed transfers with RW and min A and able to progress to short distance ambulation in room (~58ft). Pt demonstrated improvements in stability in standing and sequencing of multi-step commands during session. RN requesting pt return to bed for cath. Acute PT to cont to follow.    If plan is discharge home, recommend the following: A lot of help with walking and/or transfers;A lot of help with bathing/dressing/bathroom;Direct supervision/assist for medications management;Direct supervision/assist for financial management;Assist for transportation;Help with stairs or ramp for entrance;Supervision due to cognitive status   Can travel by private vehicle     Yes  Equipment Recommendations  Other (comment) (TBD in next venue)    Recommendations for Other Services       Precautions / Restrictions Precautions Precautions: Fall Restrictions Weight Bearing Restrictions: No     Mobility  Bed Mobility Overal bed mobility: Needs Assistance Bed Mobility: Rolling, Supine to Sit, Sit to Supine Rolling: Supervision, Used rails   Supine to sit: Supervision, HOB elevated, Used rails Sit to supine: Used rails   General bed mobility comments: pt performed bed mobility with no physical assist needed, did require increased time/effort Patient Response: Cooperative  Transfers Overall transfer level: Needs assistance Equipment used: Rolling walker (2 wheels) Transfers: Sit to/from Stand Sit to Stand: Min assist            General transfer comment: stood from EOB with RW and min A; pt appeared much more stable standing today vs earlier this week    Ambulation/Gait Ambulation/Gait assistance: Min Chemical engineer (Feet): 10 Feet Assistive device: Rolling walker (2 wheels) Gait Pattern/deviations: Step-to pattern, Decreased step length - right, Decreased step length - left, Decreased stride length, Trunk flexed, Narrow base of support Gait velocity: decreased Gait velocity interpretation: <1.31 ft/sec, indicative of household ambulator   General Gait Details: pt ambulates with short, shuffling steps and downward gaze. Noted improvements in sequencing stepping today   Stairs             Wheelchair Mobility     Tilt Bed Tilt Bed Patient Response: Cooperative  Modified Rankin (Stroke Patients Only)       Balance Overall balance assessment: Needs assistance Sitting-balance support: Feet supported, Bilateral upper extremity supported Sitting balance-Leahy Scale: Fair Sitting balance - Comments: able to maintain static sitting balance with close supervision   Standing balance support: Bilateral upper extremity supported, Reliant on assistive device for balance (RW) Standing balance-Leahy Scale: Poor Standing balance comment: able to maintain static and dyanmic standing balance with min A today                            Cognition Arousal: Alert Behavior During Therapy: WFL for tasks assessed/performed Overall Cognitive Status: Impaired/Different from baseline Area of Impairment: Memory, Attention, Following commands, Safety/judgement, Awareness, Problem solving                     Memory: Decreased short-term memory Following Commands: Follows one step commands with increased time, Follows  multi-step commands inconsistently Safety/Judgement: Decreased awareness of safety   Problem Solving: Difficulty sequencing, Requires verbal cues, Requires tactile cues           Exercises      General Comments General comments (skin integrity, edema, etc.): multiple family members present to encourage pt during session      Pertinent Vitals/Pain Pain Assessment Pain Assessment: No/denies pain    Home Living                          Prior Function            PT Goals (current goals can now be found in the care plan section) Acute Rehab PT Goals Patient Stated Goal: to go home PT Goal Formulation: With patient/family Time For Goal Achievement: 04/27/23 Potential to Achieve Goals: Fair Progress towards PT goals: Progressing toward goals    Frequency    Min 1X/week      PT Plan      Co-evaluation              AM-PAC PT "6 Clicks" Mobility   Outcome Measure  Help needed turning from your back to your side while in a flat bed without using bedrails?: A Little Help needed moving from lying on your back to sitting on the side of a flat bed without using bedrails?: A Little Help needed moving to and from a bed to a chair (including a wheelchair)?: A Little Help needed standing up from a chair using your arms (e.g., wheelchair or bedside chair)?: A Little Help needed to walk in hospital room?: A Little Help needed climbing 3-5 steps with a railing? : Total 6 Click Score: 16    End of Session Equipment Utilized During Treatment: Gait belt Activity Tolerance: Patient tolerated treatment well Patient left: in bed;with call bell/phone within reach;with bed alarm set;with family/visitor present Nurse Communication: Mobility status PT Visit Diagnosis: Unsteadiness on feet (R26.81);Other abnormalities of gait and mobility (R26.89);Muscle weakness (generalized) (M62.81)     Time: 5573-2202 PT Time Calculation (min) (ACUTE ONLY): 16 min  Charges:    $Therapeutic Activity: 8-22 mins PT General Charges $$ ACUTE PT VISIT: 1 Visit                     Blima Rich PT, DPT [facility] 04/15/2023, 12:15 PM

## 2023-04-16 DIAGNOSIS — R531 Weakness: Secondary | ICD-10-CM | POA: Diagnosis not present

## 2023-04-16 LAB — BASIC METABOLIC PANEL
Anion gap: 5 (ref 5–15)
BUN: 16 mg/dL (ref 8–23)
CO2: 23 mmol/L (ref 22–32)
Calcium: 7.8 mg/dL — ABNORMAL LOW (ref 8.9–10.3)
Chloride: 109 mmol/L (ref 98–111)
Creatinine, Ser: 0.75 mg/dL (ref 0.44–1.00)
GFR, Estimated: >60 mL/min (ref >60)
Glucose, Bld: 87 mg/dL (ref 70–99)
Potassium: 3.3 mmol/L — ABNORMAL LOW (ref 3.5–5.1)
Sodium: 137 mmol/L (ref 135–145)

## 2023-04-16 LAB — MAGNESIUM: Magnesium: 1.9 mg/dL (ref 1.7–2.4)

## 2023-04-16 LAB — PHOSPHORUS: Phosphorus: 2.7 mg/dL (ref 2.5–4.6)

## 2023-04-16 LAB — GLUCOSE, CAPILLARY
Glucose-Capillary: 95 mg/dL (ref 70–99)
Glucose-Capillary: 149 mg/dL — ABNORMAL HIGH (ref 70–99)
Glucose-Capillary: 104 mg/dL — ABNORMAL HIGH (ref 70–99)

## 2023-04-16 MED ORDER — MORPHINE SULFATE (PF) 2 MG/ML IV SOLN
0.5000 mg | INTRAVENOUS | Status: DC | PRN
  Administered 2023-04-16: 0.5 mg via INTRAVENOUS
  Filled 2023-04-16: qty 1

## 2023-04-16 MED ORDER — MORPHINE SULFATE (PF) 2 MG/ML IV SOLN: 1.0000 mg | INTRAVENOUS | Status: DC | PRN

## 2023-04-16 MED ORDER — CHLORHEXIDINE GLUCONATE CLOTH 2 % EX PADS
6.0000 | MEDICATED_PAD | Freq: Every day | CUTANEOUS | Status: DC
  Administered 2023-04-16 – 2023-04-20 (×4): 6 via TOPICAL

## 2023-04-16 MED ORDER — ADULT MULTIVITAMIN W/MINERALS CH
1.0000 | ORAL_TABLET | Freq: Every day | ORAL | Status: DC
  Administered 2023-04-16 – 2023-04-20 (×5): 1 via ORAL
  Filled 2023-04-16 (×5): qty 1

## 2023-04-16 MED ORDER — POTASSIUM CHLORIDE CRYS ER 20 MEQ PO TBCR
40.0000 meq | EXTENDED_RELEASE_TABLET | Freq: Once | ORAL | Status: AC
  Administered 2023-04-16: 40 meq via ORAL
  Filled 2023-04-16: qty 2

## 2023-04-16 MED ORDER — TRAMADOL HCL 50 MG PO TABS
25.0000 mg | ORAL_TABLET | Freq: Four times a day (QID) | ORAL | Status: DC | PRN
  Administered 2023-04-18: 25 mg via ORAL
  Filled 2023-04-16: qty 1

## 2023-04-16 NOTE — TOC Progression Note (Signed)
Transition of Care [facility]) - Progression Note    Patient Details  Name: BEV LAWTER MRN: [id number] Date of Birth: 1924-05-21  [facility]) CM/SW Contact  Carley Hammed, LCSW Phone Number: 04/16/2023, 12:00 PM  Clinical Narrative:    CSW spoke with pt's dtr Aurea Graff who noted that they would like [facility] or [facility] at DC. CSW was advised by dtr that pt will need to have a private room and that MD said DC possibly Monday or Tuesday. CSW noted that she would follow up with facilities on Monday to determine what bed availability looked like to determine their choice. TOC will continue to follow.    Expected Discharge Plan: Skilled Nursing Facility Barriers to Discharge: SNF Pending bed offer  Expected Discharge Plan and Services In-house Referral: Clinical Social Work   Post Acute Care Choice: Skilled Nursing Facility Living arrangements for the past 2 months: Single Family Home                                       Social Determinants of Health (SDOH) Interventions SDOH Screenings   Food Insecurity: No Food Insecurity (04/12/2023)  Housing: Low Risk  (04/12/2023)  Transportation Needs: No Transportation Needs (04/12/2023)  Utilities: Not At Risk (04/12/2023)  Tobacco Use: Low Risk  (04/12/2023)    Readmission Risk Interventions     No data to display

## 2023-04-16 NOTE — Progress Notes (Signed)
PROGRESS NOTE  Nancy Bell  NFA:[id number] DOB: [birth year] DOA: 04/12/2023 PCP: Darrow Bussing, MD   Brief Narrative: Patient is a 89 year old female with history of hypertension, hyperlipidemia, breast cancer who was brought to ED by her family  fro evaluation of weakness, worsening confusion, difficulty ambulating  at home.   On presentation, she was hemodynamically stable.  Lab work showed WC count of 12.6 UA not suspicious for UTI.  Chest x-ray did not show any acute findings.  MRI of the brain did not show any acute findings.  Patient admitted for PT/OT evaluation for possible placement to SNF.  Current recommendation is skilled nursing facility.  Hospital course remarkable for urine retention, poor oral intake.  Started on gentle IV fluids.  Ultimate plan is for SNF.  Assessment & Plan:  Principal Problem:   Weakness   Weakness: Likely from deconditioning, advanced age.But its sudden decline.  She was walking until few days ago before admission.  PT/OT consulted.  Recommendation is for skilled nursing facility on discharge.TOC following.  MRI of the brain did not show any acute findings.  She does not complain of any back pain,  no numbness or loss of sensation on bilateral lower extremities or  no focal deficits.  I do not think we need to do MRI of the spine.  Acute encephalopathy on the background of dementia: Likely from worsening delirium/cognitive impairment.  She has  underlying dementia.  MRI did not show any acute findings. UA was not  suspicious for UTI.  Has mild leukocytosis but this was most likely reactive,now resolved. Continue delirium precautions, frequent reorientation. Mostly oriented now.  Hypokalemia: Supplemented and corrected  AKI/urinary retention: AKI resolved but she was found to have urinary retention . In and out  cath performed with removal of 1200 mL of urine on 12/4.  Started on Flomax.  Bladder scan checked again on 12/5 showed retention with urine of  about 500 mL.  We placed Foley catheter. Will do voiding trial today.  If she continues to retain, she need to be discharged on Foley catheter and follow-up with urology as an outpatient  Poor oral intake: Encourage oral intake.  After discussion with family, will start on gentle IV fluids.  Kidney function is stable, no elevated BUN.Dietician consulted and following  Hyperlipidemia: On statin at home  Hypertension: Previously taking amlodipine, hydrochlorothiazide at home. But not taking now, blood pressure soft but stable this morning.  History of left breast cancer: Status post mastectomy, currently in remission  Nutrition Problem: Severe Malnutrition Etiology: poor appetite, chronic illness    DVT prophylaxis:enoxaparin (LOVENOX) injection 30 mg Start: 04/13/23 2200 SCDs Start: 04/12/23 2112     Code Status: Full Code  Family Communication: Discussed with daughter,son-in-law at bedside on 12/6  Patient status: Inpatient  Patient is from : Home  Anticipated discharge to: SNF  Estimated DC date: 1-2 days   Consultants: None  Procedures: None  Antimicrobials:  Anti-infectives (From admission, onward)    None       Subjective: Patient seen and examined at the bedside this morning and IN the afternoon.  Family at bedside.  She is more alert than yesterday.  Mostly oriented.  She not agitated.  Foley is not bothering her today.  This afternoon, she developed some nausea.  Dietitian saw her.  We discussed about giving her voiding trial.  Her oral intake is better than yesterday.  Will continue gentle IV fluids  Objective: Vitals:   04/15/23 1603 04/15/23 1935  04/16/23 0332 04/16/23 0746  BP: (!) 123/57 (!) 112/49 (!) 113/48 (!) 105/54  Pulse: 81 86 70 77  Resp: 17 18 18 18   Temp: 97.8 F (36.6 C) 97.8 F (36.6 C) 97.6 F (36.4 C) 97.6 F (36.4 C)  TempSrc: Oral Oral Oral Oral  SpO2: 93% 95% 97% 97%  Weight:      Height:        Intake/Output Summary (Last  24 hours) at 04/16/2023 1339 Last data filed at 04/16/2023 1105 Gross per 24 hour  Intake 2172.98 ml  Output 1550 ml  Net 622.98 ml   Filed Weights   04/12/23 1151  Weight: 56.2 kg    Examination: General exam: Overall comfortable, not in distress, very deconditioned and weak HEENT: PERRL Respiratory system:  no wheezes or crackles  Cardiovascular system: S1 & S2 heard, RRR.  Gastrointestinal system: Abdomen is nondistended, soft and nontender. Central nervous system: Alert and oriented Extremities: No edema, no clubbing ,no cyanosis Skin: No rashes, no ulcers,no icterus  GU: Foley catheter   Data Reviewed: I have personally reviewed following labs and imaging studies  CBC: Recent Labs  Lab 04/12/23 1219 04/13/23 0659  WBC 12.6* 8.0  HGB 13.3 12.8  HCT 41.8 39.0  MCV 92.3 90.1  PLT 228 208   Basic Metabolic Panel: Recent Labs  Lab 04/12/23 1219 04/13/23 0659 04/14/23 0631 04/15/23 0518 04/16/23 0612  NA 140 138 139 137 137  K 3.7 3.1* 3.6 3.3* 3.3*  CL 109 104 108 105 109  CO2 23 22 23 24 23   GLUCOSE 109* 114* 131* 96 87  BUN 17 12 13 18 16   CREATININE 0.90 1.30* 0.91 0.89 0.75  CALCIUM 9.3 8.6* 8.8* 8.6* 7.8*  MG  --   --  2.1  --  1.9  PHOS  --   --   --   --  2.7     Recent Results (from the past 240 hour(s))  Resp panel by RT-PCR (RSV, Flu A&B, Covid) Anterior Nasal Swab     Status: None   Collection Time: 04/12/23 11:55 AM   Specimen: Anterior Nasal Swab  Result Value Ref Range Status   SARS Coronavirus 2 by RT PCR NEGATIVE NEGATIVE Final   Influenza A by PCR NEGATIVE NEGATIVE Final   Influenza B by PCR NEGATIVE NEGATIVE Final    Comment: (NOTE) The Xpert Xpress SARS-CoV-2/FLU/RSV plus assay is intended as an aid in the diagnosis of influenza from Nasopharyngeal swab specimens and should not be used as a sole basis for treatment. Nasal washings and aspirates are unacceptable for Xpert Xpress SARS-CoV-2/FLU/RSV testing.  Fact Sheet for  Patients: BloggerCourse.com  Fact Sheet for Healthcare Providers: SeriousBroker.it  This test is not yet approved or cleared by the [place] FDA and has been authorized for detection and/or diagnosis of SARS-CoV-2 by FDA under an Emergency Use Authorization (EUA). This EUA will remain in effect (meaning this test can be used) for the duration of the COVID-19 declaration under Section 564(b)(1) of the Act, 21 U.S.C. section 360bbb-3(b)(1), unless the authorization is terminated or revoked.     Resp Syncytial Virus by PCR NEGATIVE NEGATIVE Final    Comment: (NOTE) Fact Sheet for Patients: BloggerCourse.com  Fact Sheet for Healthcare Providers: SeriousBroker.it  This test is not yet approved or cleared by the [place] FDA and has been authorized for detection and/or diagnosis of SARS-CoV-2 by FDA under an Emergency Use Authorization (EUA). This EUA will remain in effect (meaning this test  can be used) for the duration of the COVID-19 declaration under Section 564(b)(1) of the Act, 21 U.S.C. section 360bbb-3(b)(1), unless the authorization is terminated or revoked.  Performed at [facility], [address] [place], [place] [address]      Radiology Studies: No results found.  Scheduled Meds:  Chlorhexidine Gluconate Cloth  6 each Topical Q0600   enoxaparin (LOVENOX) injection  30 mg Subcutaneous Q24H   feeding supplement  237 mL Oral BID BM   multivitamin with minerals  1 tablet Oral Daily   pantoprazole  40 mg Oral Daily   pravastatin  40 mg Oral q1800   tamsulosin  0.4 mg Oral Daily   Continuous Infusions:  sodium chloride 75 mL/hr at 04/16/23 0300     LOS: 4 days   Burnadette Pop, MD [facility] P12/10/2022, 1:39 PM

## 2023-04-16 NOTE — Progress Notes (Addendum)
Initial Nutrition Assessment  DOCUMENTATION CODES:   Severe malnutrition in context of acute illness/injury  INTERVENTION:   - Continue Regular diet and encourage PO intake   - Continue Ensure BID, each supplement provides 350 kcal and 20 grams of protein.  - Provide MVI with minerals daily and snacks   - Recommend updated weight   NUTRITION DIAGNOSIS:   Severe Malnutrition related to poor appetite, chronic illness as evidenced by severe muscle depletion, severe fat depletion.   GOAL:   Patient will meet greater than or equal to 90% of their needs   MONITOR:   PO intake, Supplement acceptance, Weight trends, Labs  REASON FOR ASSESSMENT:   Consult Assessment of nutrition requirement/status  ASSESSMENT:  89 y.o F with PMH for HTN, HLD, and breast cancer. Presented due to leg weakness and worsening confusion. Patient admitted for PT/OT evaluation for possible placement to SNF.  Son in law in room on visit, pt was awake and alert. Pt lives with son in law and her daughter. Pt was walking at home. Per Son in law she had fair PO intake PTA she would have a muffin and juice for breakfast, chicken salad sandwich with crackers for lunch, and dinner would vary, sometimes takeout from a restaurant.   Pt says she does not have much of an appetite, this morning she had a couple bites of her breakfast. Pt does not like the Ensures but family tries to get her to drink them. Pt not willing to try Boost, Magic cup, or Mighty shakes. Was willing to try snacks.  Weight seems to be trending down.   Pt having urine retention, foley catheter to be placed today. Per MD, Medically stable for D/C when bed is available SNF.  Potassium low, but being supplemented.    Admit weight: 56.2 kg stated? Current weight: 56.2 kg    04/12/23 56.2 kg  01/22/23 48.5 kg  10/01/22 50.7 kg  06/12/22 50.7 kg  12/10/21 49.9 kg  09/30/21 52.1 kg  08/01/21 50.8 kg    Average Meal Intake: 12/5: 33%  intake x 2 recorded meals  Nutritionally Relevant Medications: Scheduled Meds:  Chlorhexidine Gluconate Cloth  6 each Topical Q0600   enoxaparin (LOVENOX) injection  30 mg Subcutaneous Q24H   feeding supplement  237 mL Oral BID BM   multivitamin with minerals  1 tablet Oral Daily   pantoprazole  40 mg Oral Daily   pravastatin  40 mg Oral q1800   tamsulosin  0.4 mg Oral Daily   Continuous Infusions:  sodium chloride 75 mL/hr at 04/16/23 0300    Labs Reviewed: Potassium 3.3, Calcium 7.8 CBG ranges from 95-195 mg/dL over the last 24 hours  NUTRITION - FOCUSED PHYSICAL EXAM:  Flowsheet Row Most Recent Value  Upper Arm Region Severe depletion  Thoracic and Lumbar Region Severe depletion  Temple Region Severe depletion  Clavicle Bone Region Severe depletion  Clavicle and Acromion Bone Region Severe depletion  Scapular Bone Region Severe depletion  Dorsal Hand Severe depletion  Patellar Region Moderate depletion  Anterior Thigh Region Moderate depletion  Posterior Calf Region Mild depletion  Edema (RD Assessment) None  Hair Reviewed  Eyes Reviewed  Mouth Reviewed  Skin Reviewed  Nails Reviewed       Diet Order:   Diet Order             Diet regular Room service appropriate? Yes; Fluid consistency: Thin  Diet effective now  EDUCATION NEEDS:   Education needs have been addressed  Skin:  Skin Assessment: Reviewed RN Assessment  Last BM:  04/13/2023, type 6 smear  Height:   Ht Readings from Last 1 Encounters:  04/12/23 5\' 2"  (1.575 m)    Weight:   Wt Readings from Last 1 Encounters:  04/12/23 56.2 kg    Ideal Body Weight:  49.55 kg  BMI:  Body mass index is 22.68 kg/m.  Estimated Nutritional Needs:   Kcal:  1400-1600 kcal  Protein:  70-80 gm  Fluid:  >1.4 L   Elliot Dally, RD Registered Dietitian  See Amion for more information

## 2023-04-16 NOTE — Progress Notes (Signed)
Physical Therapy Treatment Patient Details Name: Nancy Bell MRN: [id number] DOB: 1924/12/01 Today's Date: 04/16/2023   History of Present Illness 89 yo presents to [facility]/12/23 with confusion and difficulty walking. Admitted w/ acute encephalopathy on background of dementia, MRI negative. PMH HTN HLD anxiety breast CA  total mastectomy    PT Comments  Pt in bed upon arrival and agreeable to PT session. Worked on gait training and LE strength in today's session. Pt continues to need MinA while ambulating to keep RW close and for management with turns. Pt requires multiple cues for safety with RW with little follow through. Pt is progressing well towards goals. Current d/c recs remain appropriate to help pt return to previous level of independence. Acute PT to follow.      If plan is discharge home, recommend the following: Direct supervision/assist for financial management;Assist for transportation;Help with stairs or ramp for entrance;A little help with walking and/or transfers;A little help with bathing/dressing/bathroom;Direct supervision/assist for medications management   Can travel by private vehicle     Yes  Equipment Recommendations  Other (comment) (TBD next venue)       Precautions / Restrictions Precautions Precautions: Fall Restrictions Weight Bearing Restrictions: No     Mobility  Bed Mobility Overal bed mobility: Needs Assistance Bed Mobility: Sit to Supine, Supine to Sit     Supine to sit: Contact guard, Used rails     General bed mobility comments: CGA for safety, increased time and cues for sequencing.    Transfers Overall transfer level: Needs assistance Equipment used: Rolling walker (2 wheels) Transfers: Sit to/from Stand Sit to Stand: Min assist, Contact guard assist        General transfer comment: initial stand required MinA for boost up, progressed to CGA with second stand    Ambulation/Gait Ambulation/Gait assistance: Min assist Gait  Distance (Feet): 60 Feet Assistive device: Rolling walker (2 wheels) Gait Pattern/deviations: Step-to pattern, Decreased step length - right, Decreased step length - left, Decreased stride length, Trunk flexed, Narrow base of support Gait velocity: dec     General Gait Details: MinA for RW management with turns and to keep RW close       Balance Overall balance assessment: Needs assistance Sitting-balance support: Feet supported, Bilateral upper extremity supported Sitting balance-Leahy Scale: Good     Standing balance support: Bilateral upper extremity supported, During functional activity, Reliant on assistive device for balance Standing balance-Leahy Scale: Poor Standing balance comment: reliant on RW         Cognition Arousal: Alert Behavior During Therapy: WFL for tasks assessed/performed Overall Cognitive Status: Impaired/Different from baseline Area of Impairment: Attention, Following commands, Safety/judgement, Awareness, Problem solving    Current Attention Level: Sustained   Following Commands: Follows one step commands with increased time, Follows one step commands consistently Safety/Judgement: Decreased awareness of safety Awareness: Emergent Problem Solving: Difficulty sequencing, Requires verbal cues, Requires tactile cues General Comments: increased time to follow commands, very pleasant with a good sense of humor        Exercises General Exercises - Lower Extremity Long Arc Quad: AROM, Both, 10 reps, Seated Hip Flexion/Marching: AROM, Both, 15 reps, Seated    General Comments General comments (skin integrity, edema, etc.): VSS on RA, family present and encouraging      Pertinent Vitals/Pain Pain Assessment Pain Assessment: Faces Faces Pain Scale: Hurts little more Pain Location: abdomen Pain Descriptors / Indicators: Discomfort, Guarding, Grimacing Pain Intervention(s): Limited activity within patient's tolerance, Monitored during session,  Repositioned  PT Goals (current goals can now be found in the care plan section) Acute Rehab PT Goals PT Goal Formulation: With patient/family Time For Goal Achievement: 04/27/23 Potential to Achieve Goals: Fair Progress towards PT goals: Progressing toward goals    Frequency    Min 1X/week       AM-PAC PT "6 Clicks" Mobility   Outcome Measure  Help needed turning from your back to your side while in a flat bed without using bedrails?: None Help needed moving from lying on your back to sitting on the side of a flat bed without using bedrails?: A Little Help needed moving to and from a bed to a chair (including a wheelchair)?: A Little Help needed standing up from a chair using your arms (e.g., wheelchair or bedside chair)?: A Little Help needed to walk in hospital room?: A Little Help needed climbing 3-5 steps with a railing? : A Lot 6 Click Score: 18    End of Session Equipment Utilized During Treatment: Gait belt Activity Tolerance: Patient tolerated treatment well Patient left: in chair;with call bell/phone within reach;with chair alarm set;with family/visitor present Nurse Communication: Mobility status PT Visit Diagnosis: Unsteadiness on feet (R26.81);Other abnormalities of gait and mobility (R26.89);Muscle weakness (generalized) (M62.81)     Time: 7829-5621 PT Time Calculation (min) (ACUTE ONLY): 24 min  Charges:    $Gait Training: 8-22 mins $Therapeutic Exercise: 8-22 mins PT General Charges $$ ACUTE PT VISIT: 1 Visit                     Hilton Cork, PT, DPT Secure Chat Preferred  Rehab Office [phone]   Arturo Morton Brion Aliment 04/16/2023, 4:42 PM

## 2023-04-17 ENCOUNTER — Inpatient Hospital Stay (HOSPITAL_COMMUNITY): Payer: Medicare Other

## 2023-04-17 DIAGNOSIS — R531 Weakness: Secondary | ICD-10-CM | POA: Diagnosis not present

## 2023-04-17 DIAGNOSIS — E43 Unspecified severe protein-calorie malnutrition: Secondary | ICD-10-CM | POA: Insufficient documentation

## 2023-04-17 LAB — BASIC METABOLIC PANEL
Anion gap: 6 (ref 5–15)
BUN: 7 mg/dL — ABNORMAL LOW (ref 8–23)
CO2: 23 mmol/L (ref 22–32)
Calcium: 7.9 mg/dL — ABNORMAL LOW (ref 8.9–10.3)
Chloride: 110 mmol/L (ref 98–111)
Creatinine, Ser: 0.65 mg/dL (ref 0.44–1.00)
GFR, Estimated: >60 mL/min (ref >60)
Glucose, Bld: 94 mg/dL (ref 70–99)
Potassium: 3.1 mmol/L — ABNORMAL LOW (ref 3.5–5.1)
Sodium: 139 mmol/L (ref 135–145)

## 2023-04-17 LAB — GLUCOSE, CAPILLARY: Glucose-Capillary: 101 mg/dL — ABNORMAL HIGH (ref 70–99)

## 2023-04-17 MED ORDER — POTASSIUM CHLORIDE CRYS ER 20 MEQ PO TBCR
40.0000 meq | EXTENDED_RELEASE_TABLET | ORAL | Status: AC
Start: 2023-04-17 — End: 2023-04-17
  Administered 2023-04-17 (×2): 40 meq via ORAL
  Filled 2023-04-17 (×2): qty 2

## 2023-04-17 MED ORDER — LORAZEPAM 2 MG/ML IJ SOLN
1.0000 mg | Freq: Once | INTRAMUSCULAR | Status: AC
  Administered 2023-04-17: 1 mg via INTRAVENOUS
  Filled 2023-04-17: qty 1

## 2023-04-17 MED ORDER — PRAVASTATIN SODIUM 40 MG PO TABS
40.0000 mg | ORAL_TABLET | Freq: Every day | ORAL | Status: DC
  Administered 2023-04-17 – 2023-04-19 (×3): 40 mg via ORAL
  Filled 2023-04-17 (×3): qty 1

## 2023-04-17 NOTE — Progress Notes (Signed)
Patient voided approx. amber clear urine at 0500. Bladder scanned for: 1st reading: 658, 2nd reading:488. 58F foley inserted, per order of Dr Renford Dills, with immediate return of approx 600 ml clear yellow urine. Patient voiced relief.

## 2023-04-17 NOTE — Progress Notes (Addendum)
PROGRESS NOTE  Nancy Bell  OZD:[id number] DOB: 1925/04/15 DOA: 04/12/2023 PCP: Darrow Bussing, MD   Brief Narrative: Patient is a 89 year old female with history of hypertension, hyperlipidemia, breast cancer who was brought to ED by her family  fro evaluation of weakness, worsening confusion, difficulty ambulating  at home.   On presentation, she was hemodynamically stable.  Lab work showed WC count of 12.6 UA not suspicious for UTI.  Chest x-ray did not show any acute findings.  MRI of the brain did not show any acute findings.  Patient admitted for PT/OT evaluation for possible placement to SNF.  Current recommendation is skilled nursing facility.  Hospital course remarkable for urine retention, poor oral intake.  Started on gentle IV fluids.  Ultimate plan is for SNF.  Assessment & Plan:  Principal Problem:   Weakness Active Problems:   Protein-calorie malnutrition, severe   Weakness: Likely from deconditioning, advanced age.But its sudden decline.  She was walking until few days ago before admission.  PT/OT consulted.  Recommendation is for skilled nursing facility on discharge.TOC following.  MRI of the brain did not show any acute findings.  She does not complain of any back pain,  no numbness or loss of sensation on bilateral lower extremities or  no focal deficits.  Ordered MRI of the lumbar spine,did not show any acute findings  Acute encephalopathy on the background of dementia: Likely from worsening delirium/cognitive impairment.  She has  underlying dementia.  MRI did not show any acute findings. UA was not  suspicious for UTI.  Has mild leukocytosis but this was most likely reactive,now resolved. Continue delirium precautions, frequent reorientation. Mostly oriented now.  Mental status is at baseline.  Hypokalemia: Supplemented   AKI/urinary retention: AKI resolved but she was found to have urinary retention . In and out  cath performed with removal of 1200 mL of urine on  12/4.  Started on Flomax.  Ultimately required Foley catheter.  Failed voiding trial on 12/7 so Foley replaced .ultrasound of the kidney/bladder did not show any acute findings.  She needs to follow-up with urology as an outpatient.  She needs to be discharged on Foley catheter  Poor oral intake:   After discussion with family, will start on gentle IV fluids.  Kidney function is stable, no elevated BUN.Dietician consulted .  Family wants to continue IV fluids for today.  Hyperlipidemia: On statin at home  Hypertension: Previously taking amlodipine, hydrochlorothiazide at home. But not taking now, blood pressure soft but stable this morning.  History of left breast cancer: Status post mastectomy, currently in remission  Nutrition Problem: Severe Malnutrition Etiology: poor appetite, chronic illness    DVT prophylaxis:enoxaparin (LOVENOX) injection 30 mg Start: 04/13/23 2200 SCDs Start: 04/12/23 2112     Code Status: Full Code  Family Communication: Discussed with daughter,son-in-law at bedside on 12/7  Patient status: Inpatient  Patient is from : Home  Anticipated discharge to: SNF  Estimated DC date: 1-2 days   Consultants: None  Procedures: None  Antimicrobials:  Anti-infectives (From admission, onward)    None       Subjective: Patient seen and examined the bedside today.  Hemodynamically stable.  She looks better today.  Comfortable.  Mostly oriented.  Foley had to be placed back.  She is eating some food now.  Family at bedside.  Denies new complaints  Objective: Vitals:   04/16/23 1709 04/16/23 2050 04/17/23 0351 04/17/23 0931  BP: 125/61 138/66 139/68 121/62  Pulse: 85 86 78  82  Resp: 18 20 18 18   Temp: (!) 97.5 F (36.4 C) 98.2 F (36.8 C) 97.6 F (36.4 C) 98.2 F (36.8 C)  TempSrc: Oral Oral Oral Oral  SpO2: 100% 94% 98% 97%  Weight:      Height:        Intake/Output Summary (Last 24 hours) at 04/17/2023 1332 Last data filed at 04/17/2023  0900 Gross per 24 hour  Intake 380 ml  Output 951 ml  Net -571 ml   Filed Weights   04/12/23 1151  Weight: 56.2 kg    Examination:  General exam: Overall comfortable, not in distress, deconditioned, weak HEENT: PERRL Respiratory system:  no wheezes or crackles  Cardiovascular system: S1 & S2 heard, RRR.  Gastrointestinal system: Abdomen is nondistended, soft and nontender. Central nervous system: Alert and oriented Extremities: No edema, no clubbing ,no cyanosis Skin: No rashes, no ulcers,no icterus   GU: Foley catheter   Data Reviewed: I have personally reviewed following labs and imaging studies  CBC: Recent Labs  Lab 04/12/23 1219 04/13/23 0659  WBC 12.6* 8.0  HGB 13.3 12.8  HCT 41.8 39.0  MCV 92.3 90.1  PLT 228 208   Basic Metabolic Panel: Recent Labs  Lab 04/13/23 0659 04/14/23 0631 04/15/23 0518 04/16/23 0612 04/17/23 0428  NA 138 139 137 137 139  K 3.1* 3.6 3.3* 3.3* 3.1*  CL 104 108 105 109 110  CO2 22 23 24 23 23   GLUCOSE 114* 131* 96 87 94  BUN 12 13 18 16  7*  CREATININE 1.30* 0.91 0.89 0.75 0.65  CALCIUM 8.6* 8.8* 8.6* 7.8* 7.9*  MG  --  2.1  --  1.9  --   PHOS  --   --   --  2.7  --      Recent Results (from the past 240 hour(s))  Resp panel by RT-PCR (RSV, Flu A&B, Covid) Anterior Nasal Swab     Status: None   Collection Time: 04/12/23 11:55 AM   Specimen: Anterior Nasal Swab  Result Value Ref Range Status   SARS Coronavirus 2 by RT PCR NEGATIVE NEGATIVE Final   Influenza A by PCR NEGATIVE NEGATIVE Final   Influenza B by PCR NEGATIVE NEGATIVE Final    Comment: (NOTE) The Xpert Xpress SARS-CoV-2/FLU/RSV plus assay is intended as an aid in the diagnosis of influenza from Nasopharyngeal swab specimens and should not be used as a sole basis for treatment. Nasal washings and aspirates are unacceptable for Xpert Xpress SARS-CoV-2/FLU/RSV testing.  Fact Sheet for Patients: BloggerCourse.com  Fact Sheet for  Healthcare Providers: SeriousBroker.it  This test is not yet approved or cleared by the [place] FDA and has been authorized for detection and/or diagnosis of SARS-CoV-2 by FDA under an Emergency Use Authorization (EUA). This EUA will remain in effect (meaning this test can be used) for the duration of the COVID-19 declaration under Section 564(b)(1) of the Act, 21 U.S.C. section 360bbb-3(b)(1), unless the authorization is terminated or revoked.     Resp Syncytial Virus by PCR NEGATIVE NEGATIVE Final    Comment: (NOTE) Fact Sheet for Patients: BloggerCourse.com  Fact Sheet for Healthcare Providers: SeriousBroker.it  This test is not yet approved or cleared by the [place] FDA and has been authorized for detection and/or diagnosis of SARS-CoV-2 by FDA under an Emergency Use Authorization (EUA). This EUA will remain in effect (meaning this test can be used) for the duration of the COVID-19 declaration under Section 564(b)(1) of the Act, 21 U.S.C.  section 360bbb-3(b)(1), unless the authorization is terminated or revoked.  Performed at [facility], [address] [place], [place] [address]      Radiology Studies: US RENAL  Result Date: 04/17/2023 CLINICAL DATA:  Impaired renal function. EXAM: RENAL / URINARY TRACT ULTRASOUND COMPLETE COMPARISON:  None Available. FINDINGS: Right Kidney: Length: 8.7 cm. Echogenicity within normal limits. No mass or hydronephrosis visualized. Left Kidney: Length: 8.8 cm. Echogenicity within normal limits. No mass or hydronephrosis visualized. Bladder: Empty and not evaluated. IMPRESSION: Unremarkable examination of the kidneys. Electronically Signed   By: Layla Maw M.D.   On: 04/17/2023 12:01    Scheduled Meds:  Chlorhexidine Gluconate Cloth  6 each Topical Q0600   enoxaparin (LOVENOX) injection  30 mg Subcutaneous Q24H   feeding supplement  237 mL  Oral BID BM   multivitamin with minerals  1 tablet Oral Daily   pantoprazole  40 mg Oral Daily   pravastatin  40 mg Oral q1800   tamsulosin  0.4 mg Oral Daily   Continuous Infusions:  sodium chloride 75 mL/hr at 04/16/23 1608     LOS: 5 days   Burnadette Pop, MD [facility] P12/11/2022, 1:32 PM

## 2023-04-17 NOTE — Progress Notes (Signed)
Pt. Remains lethargic following MRI. Dr. Renford Dills made aware.

## 2023-04-18 DIAGNOSIS — E43 Unspecified severe protein-calorie malnutrition: Secondary | ICD-10-CM

## 2023-04-18 DIAGNOSIS — R531 Weakness: Secondary | ICD-10-CM | POA: Diagnosis not present

## 2023-04-18 LAB — GLUCOSE, CAPILLARY
Glucose-Capillary: 101 mg/dL — ABNORMAL HIGH (ref 70–99)
Glucose-Capillary: 107 mg/dL — ABNORMAL HIGH (ref 70–99)
Glucose-Capillary: 150 mg/dL — ABNORMAL HIGH (ref 70–99)
Glucose-Capillary: 99 mg/dL (ref 70–99)

## 2023-04-18 LAB — BASIC METABOLIC PANEL
Anion gap: 7 (ref 5–15)
BUN: 6 mg/dL — ABNORMAL LOW (ref 8–23)
CO2: 23 mmol/L (ref 22–32)
Calcium: 8.3 mg/dL — ABNORMAL LOW (ref 8.9–10.3)
Chloride: 109 mmol/L (ref 98–111)
Creatinine, Ser: 0.69 mg/dL (ref 0.44–1.00)
GFR, Estimated: >60 mL/min (ref >60)
Glucose, Bld: 94 mg/dL (ref 70–99)
Potassium: 3.6 mmol/L (ref 3.5–5.1)
Sodium: 139 mmol/L (ref 135–145)

## 2023-04-18 NOTE — Progress Notes (Signed)
PROGRESS NOTE  Nancy Bell  ZOX:[id number] DOB: Jul 13, 1924 DOA: 04/12/2023 PCP: Darrow Bussing, MD   Brief Narrative: Patient is a 89 year old female with history of hypertension, hyperlipidemia, breast cancer who was brought to ED by her family  fro evaluation of weakness, worsening confusion, difficulty ambulating  at home.   On presentation, she was hemodynamically stable.  Lab work showed WC count of 12.6 UA not suspicious for UTI.  Chest x-ray did not show any acute findings.  MRI of the brain did not show any acute findings.  Patient admitted for PT/OT evaluation for possible placement to SNF.  Current recommendation is skilled nursing facility.  Hospital course remarkable for urine retention, poor oral intake.  Started on gentle IV fluids.  Ultimate plan is for SNF.  12/8: Remained stable, awaiting SNF placement  Assessment & Plan:  Principal Problem:   Weakness Active Problems:   Protein-calorie malnutrition, severe   Weakness: Likely from deconditioning, advanced age.But its sudden decline.  She was walking until few days ago before admission.  PT/OT consulted.  Recommendation is for skilled nursing facility on discharge.TOC following.  MRI of the brain did not show any acute findings.  She does not complain of any back pain,  no numbness or loss of sensation on bilateral lower extremities or  no focal deficits.  MRI of the lumbar spine,did not show any acute findings PT is recommending SNF  Acute encephalopathy on the background of dementia: Likely from worsening delirium/cognitive impairment.  She has  underlying dementia.  MRI did not show any acute findings. UA was not  suspicious for UTI.  Has mild leukocytosis but this was most likely reactive,now resolved. Continue delirium precautions, frequent reorientation. Mostly oriented now.  Mental status is at baseline.  Hypokalemia: Supplemented   AKI/urinary retention: AKI resolved but she was found to have urinary retention  . In and out  cath performed with removal of 1200 mL of urine on 12/4.  Started on Flomax.  Ultimately required Foley catheter.  Failed voiding trial on 12/7 so Foley replaced .ultrasound of the kidney/bladder did not show any acute findings.  She needs to follow-up with urology as an outpatient.  She needs to be discharged on Foley catheter  Poor oral intake:   After discussion with family, will start on gentle IV fluids.  Kidney function is stable, no elevated BUN.Dietician consulted .  Family wants to continue IV fluids for today.  Hyperlipidemia: On statin at home  Hypertension: Previously taking amlodipine, hydrochlorothiazide at home. But not taking now, blood pressure soft but stable this morning.  History of left breast cancer: Status post mastectomy, currently in remission  Nutrition Problem: Severe Malnutrition Etiology: poor appetite, chronic illness    DVT prophylaxis:enoxaparin (LOVENOX) injection 30 mg Start: 04/13/23 2200 SCDs Start: 04/12/23 2112     Code Status: Full Code  Family Communication: Discussed with daughter and son-in-law at bedside  Patient status: Inpatient  Patient is from : Home  Anticipated discharge to: SNF  Estimated DC date: 1-2 days   Consultants: None  Procedures: None  Antimicrobials:  Anti-infectives (From admission, onward)    None       Subjective: Patient was seen and examined today.  No new concern.  Objective: Vitals:   04/17/23 1638 04/17/23 2121 04/18/23 0450 04/18/23 0840  BP: 129/62 130/62 (!) 132/54 131/77  Pulse: 73 84 71 76  Resp: 16 18 18 16   Temp: 98.2 F (36.8 C) 98.2 F (36.8 C) 98.2 F (36.8 C)  97.8 F (36.6 C)  TempSrc: Axillary Oral Oral Oral  SpO2: 96% 95% 97% 98%  Weight:      Height:        Intake/Output Summary (Last 24 hours) at 04/18/2023 1433 Last data filed at 04/18/2023 1056 Gross per 24 hour  Intake 480 ml  Output 2400 ml  Net -1920 ml   Filed Weights   04/12/23 1151  Weight:  56.2 kg    Examination:  General.  Frail and pleasant elderly lady, in no acute distress. Pulmonary.  Lungs clear bilaterally, normal respiratory effort. CV.  Regular rate and rhythm, no JVD, rub or murmur. Abdomen.  Soft, nontender, nondistended, BS positive. CNS.  Alert and oriented .  No focal neurologic deficit. Extremities.  No edema, no cyanosis, pulses intact and symmetrical.  Data Reviewed: I have personally reviewed following labs and imaging studies  CBC: Recent Labs  Lab 04/12/23 1219 04/13/23 0659  WBC 12.6* 8.0  HGB 13.3 12.8  HCT 41.8 39.0  MCV 92.3 90.1  PLT 228 208   Basic Metabolic Panel: Recent Labs  Lab 04/14/23 0631 04/15/23 0518 04/16/23 0612 04/17/23 0428 04/18/23 0758  NA 139 137 137 139 139  K 3.6 3.3* 3.3* 3.1* 3.6  CL 108 105 109 110 109  CO2 23 24 23 23 23   GLUCOSE 131* 96 87 94 94  BUN 13 18 16  7* 6*  CREATININE 0.91 0.89 0.75 0.65 0.69  CALCIUM 8.8* 8.6* 7.8* 7.9* 8.3*  MG 2.1  --  1.9  --   --   PHOS  --   --  2.7  --   --      Recent Results (from the past 240 hour(s))  Resp panel by RT-PCR (RSV, Flu A&B, Covid) Anterior Nasal Swab     Status: None   Collection Time: 04/12/23 11:55 AM   Specimen: Anterior Nasal Swab  Result Value Ref Range Status   SARS Coronavirus 2 by RT PCR NEGATIVE NEGATIVE Final   Influenza A by PCR NEGATIVE NEGATIVE Final   Influenza B by PCR NEGATIVE NEGATIVE Final    Comment: (NOTE) The Xpert Xpress SARS-CoV-2/FLU/RSV plus assay is intended as an aid in the diagnosis of influenza from Nasopharyngeal swab specimens and should not be used as a sole basis for treatment. Nasal washings and aspirates are unacceptable for Xpert Xpress SARS-CoV-2/FLU/RSV testing.  Fact Sheet for Patients: BloggerCourse.com  Fact Sheet for Healthcare Providers: SeriousBroker.it  This test is not yet approved or cleared by the [place] FDA and has been authorized for  detection and/or diagnosis of SARS-CoV-2 by FDA under an Emergency Use Authorization (EUA). This EUA will remain in effect (meaning this test can be used) for the duration of the COVID-19 declaration under Section 564(b)(1) of the Act, 21 U.S.C. section 360bbb-3(b)(1), unless the authorization is terminated or revoked.     Resp Syncytial Virus by PCR NEGATIVE NEGATIVE Final    Comment: (NOTE) Fact Sheet for Patients: BloggerCourse.com  Fact Sheet for Healthcare Providers: SeriousBroker.it  This test is not yet approved or cleared by the [place] FDA and has been authorized for detection and/or diagnosis of SARS-CoV-2 by FDA under an Emergency Use Authorization (EUA). This EUA will remain in effect (meaning this test can be used) for the duration of the COVID-19 declaration under Section 564(b)(1) of the Act, 21 U.S.C. section 360bbb-3(b)(1), unless the authorization is terminated or revoked.  Performed at [facility], [address] [place], [place] [address]  Radiology Studies: MR LUMBAR SPINE WO CONTRAST  Result Date: 04/17/2023 CLINICAL DATA:  Lumbar radiculopathy. Chronic left-sided low back pain. Weakness with difficulty ambulating. EXAM: LIMITED MRI LUMBAR SPINE WITHOUT CONTRAST TECHNIQUE: Sagittal multisequence MR imaging of the lumbar spine was performed. The patient was unable to complete the examination. No axial imaging obtained. No intravenous contrast was administered. COMPARISON:  Or spine radiographs 09/30/2021. Abdominopelvic CT 10/01/2022. FINDINGS: Technical note: Despite efforts by the technologist and patient, mild motion artifact is present on today's exam and could not be eliminated. This reduces exam sensitivity and specificity. As above, the patient was unable to complete the examination. No axial imaging obtained. Segmentation: Conventional anatomy assumed, with the last open disc space  designated L5-S1.Concordant with prior imaging. Alignment: There is a convex left scoliosis with a grade 1 degenerative anterolisthesis at L4-5. Vertebrae: No worrisome osseous lesion, acute fracture or pars defect. No evidence of discitis or osteomyelitis. There are mild chronic endplate degenerative changes which are asymmetric to the right at L2-3. Conus medullaris: Extends to the L1 level and appears normal. Paraspinal and other soft tissues: No significant paraspinal abnormalities identified on sagittal imaging. Disc levels: Sagittal images demonstrate no significant disc space findings within the visualized lower thoracic spine. L1-2: Loss of disc height with annular disc bulging and endplate osteophytes asymmetric to the right. No significant central spinal stenosis suggested on sagittal imaging. There is moderate right and mild left foraminal narrowing. L2-3: Chronic loss of disc height with annular disc bulging and chronic right paracentral osteophytes. Mild spinal stenosis with asymmetric narrowing of the right lateral recess, similar to previous CT. Moderate right and mild left foraminal narrowing appears chronic and similar to previous CT. L3-4: Mild loss of disc height with mild disc bulging and bilateral facet hypertrophy. Moderate to severe right and mild left foraminal narrowing, similar to previous CT. L4-5: Mild loss of disc height with disc bulging, endplate osteophytes and moderate bilateral facet hypertrophy. Probable mild spinal stenosis with mild right and moderate left foraminal narrowing, grossly similar to previous CT. L5-S1: Chronic loss of disc height with annular disc bulging asymmetric to the left. Mild bilateral facet hypertrophy. Mild left foraminal narrowing, grossly similar to previous CT. IMPRESSION: 1. Limited examination due to patient motion and inability to complete the examination. No axial imaging obtained. 2. No acute findings or clear explanation for the patient's symptoms.  In comparing the limited sagittal images from today study with a prior abdominal CT of 7 months ago, no gross changes are identified. 3. Multilevel spondylosis with disc bulging, endplate osteophytes and facet hypertrophy as described. There is probable mild chronic spinal stenosis at L2-3 and L4-5. 4. Multilevel foraminal narrowing as described, most advanced on the right at L2-3 and L3-4 and on the left at L4-5. Electronically Signed   By: Carey Bullocks M.D.   On: 04/17/2023 13:46   US RENAL  Result Date: 04/17/2023 CLINICAL DATA:  Impaired renal function. EXAM: RENAL / URINARY TRACT ULTRASOUND COMPLETE COMPARISON:  None Available. FINDINGS: Right Kidney: Length: 8.7 cm. Echogenicity within normal limits. No mass or hydronephrosis visualized. Left Kidney: Length: 8.8 cm. Echogenicity within normal limits. No mass or hydronephrosis visualized. Bladder: Empty and not evaluated. IMPRESSION: Unremarkable examination of the kidneys. Electronically Signed   By: Layla Maw M.D.   On: 04/17/2023 12:01    Scheduled Meds:  Chlorhexidine Gluconate Cloth  6 each Topical Q0600   enoxaparin (LOVENOX) injection  30 mg Subcutaneous Q24H   feeding supplement  237 mL  Oral BID BM   multivitamin with minerals  1 tablet Oral Daily   pantoprazole  40 mg Oral Daily   pravastatin  40 mg Oral q1800   tamsulosin  0.4 mg Oral Daily   Continuous Infusions:  sodium chloride 75 mL/hr at 04/16/23 1608     LOS: 6 days   Arnetha Courser, MD [facility] P12/12/2022, 2:33 PM

## 2023-04-18 NOTE — Plan of Care (Signed)

## 2023-04-18 NOTE — Progress Notes (Signed)
Mobility Specialist: Progress Note   04/18/23 1626  Mobility  Activity Ambulated with assistance in hallway  Level of Assistance Contact guard assist, steadying assist  Assistive Device Front wheel walker  Distance Ambulated (ft) 75 ft  Activity Response Tolerated well  Mobility Referral Yes  Mobility visit 1 Mobility  Mobility Specialist Start Time (ACUTE ONLY) 1500  Mobility Specialist Stop Time (ACUTE ONLY) 1518  Mobility Specialist Time Calculation (min) (ACUTE ONLY) 18 min    Pt was eager for mobility session - received in bed. No complaints. CG throughout. Required verbal cues for RW proximity. Returned to room without fault. Left in bed with all needs met, call bell in reach. Family in room.  Maurene Capes Mobility Specialist Please contact via SecureChat or Rehab office at [phone]

## 2023-04-19 DIAGNOSIS — R531 Weakness: Secondary | ICD-10-CM | POA: Diagnosis not present

## 2023-04-19 LAB — GLUCOSE, CAPILLARY
Glucose-Capillary: 139 mg/dL — ABNORMAL HIGH (ref 70–99)
Glucose-Capillary: 89 mg/dL (ref 70–99)
Glucose-Capillary: 94 mg/dL (ref 70–99)

## 2023-04-19 NOTE — Progress Notes (Signed)
Physical Therapy Treatment Patient Details Name: Nancy Bell MRN: [id number] DOB: [birth year] Today's Date: 04/19/2023   History of Present Illness 89 yo presents to [facility] 04/12/23 with confusion and difficulty walking. Admitted w/ acute encephalopathy on background of dementia, MRI negative. PMH HTN HLD anxiety breast CA  total mastectomy    PT Comments  Pt tolerates treatment well, ambulating for increased distances. Pt demonstrates assistance requirements during transfers, with impaired knowledge on DME use and lack of LE power. PT provides verbal cues fof hand placement and to increase trunk flexion in an effort to improve transfer quality. PT will continue to follow. Patient will benefit from continued inpatient follow up therapy, <3 hours/day.     If plan is discharge home, recommend the following: Direct supervision/assist for financial management;Assist for transportation;Help with stairs or ramp for entrance;A little help with walking and/or transfers;A little help with bathing/dressing/bathroom;Direct supervision/assist for medications management   Can travel by private vehicle     Yes  Equipment Recommendations   (defer to post-acute setting)    Recommendations for Other Services       Precautions / Restrictions Precautions Precautions: Fall Restrictions Weight Bearing Restrictions: No     Mobility  Bed Mobility Overal bed mobility: Needs Assistance Bed Mobility: Supine to Sit, Sit to Supine     Supine to sit: Supervision, Used rails Sit to supine: Contact guard assist        Transfers Overall transfer level: Needs assistance Equipment used: Rolling walker (2 wheels) Transfers: Sit to/from Stand Sit to Stand: Min assist           General transfer comment: verbal cues for hand placement and to increase trunk flexion    Ambulation/Gait Ambulation/Gait assistance: Contact guard assist Gait Distance (Feet): 120 Feet Assistive device: Rolling walker (2  wheels) Gait Pattern/deviations: Step-through pattern Gait velocity: reduced Gait velocity interpretation: <1.8 ft/sec, indicate of risk for recurrent falls   General Gait Details: slowed step-through gait   Stairs             Wheelchair Mobility     Tilt Bed    Modified Rankin (Stroke Patients Only)       Balance Overall balance assessment: Needs assistance Sitting-balance support: No upper extremity supported, Feet supported Sitting balance-Leahy Scale: Fair     Standing balance support: Bilateral upper extremity supported, Reliant on assistive device for balance Standing balance-Leahy Scale: Poor                              Cognition Arousal: Alert Behavior During Therapy: WFL for tasks assessed/performed Overall Cognitive Status: No family/caregiver present to determine baseline cognitive functioning Area of Impairment: Memory, Safety/judgement, Awareness, Problem solving                     Memory: Decreased short-term memory Following Commands: Follows one step commands consistently Safety/Judgement: Decreased awareness of safety, Decreased awareness of deficits Awareness: Emergent Problem Solving: Slow processing, Difficulty sequencing          Exercises      General Comments General comments (skin integrity, edema, etc.): VSS on RA      Pertinent Vitals/Pain Pain Assessment Pain Assessment: No/denies pain    Home Living                          Prior Function  PT Goals (current goals can now be found in the care plan section) Acute Rehab PT Goals Patient Stated Goal: to go home Progress towards PT goals: Progressing toward goals    Frequency    Min 1X/week      PT Plan      Co-evaluation              AM-PAC PT "6 Clicks" Mobility   Outcome Measure  Help needed turning from your back to your side while in a flat bed without using bedrails?: A Little Help needed moving from  lying on your back to sitting on the side of a flat bed without using bedrails?: A Little Help needed moving to and from a bed to a chair (including a wheelchair)?: A Little Help needed standing up from a chair using your arms (e.g., wheelchair or bedside chair)?: A Little Help needed to walk in hospital room?: A Little Help needed climbing 3-5 steps with a railing? : A Lot 6 Click Score: 17    End of Session Equipment Utilized During Treatment: Gait belt Activity Tolerance: Patient tolerated treatment well Patient left: in bed;with call bell/phone within reach;with bed alarm set Nurse Communication: Mobility status PT Visit Diagnosis: Unsteadiness on feet (R26.81);Other abnormalities of gait and mobility (R26.89);Muscle weakness (generalized) (M62.81)     Time: 6269-4854 PT Time Calculation (min) (ACUTE ONLY): 24 min  Charges:    $Gait Training: 8-22 mins PT General Charges $$ ACUTE PT VISIT: 1 Visit                     Arlyss Gandy, PT, DPT Acute Rehabilitation Office [phone]    Arlyss Gandy 04/19/2023, 4:56 PM

## 2023-04-19 NOTE — Progress Notes (Signed)
Pt just urinated. Unable to record how much due to purewick moving and patient wetting the bed.

## 2023-04-19 NOTE — TOC Progression Note (Signed)
Transition of Care [facility]) - Progression Note    Patient Details  Name: Nancy Bell MRN: [id number] Date of Birth: April 14, 1925  [facility] CM/SW Contact  Carley Hammed, LCSW Phone Number: 04/19/2023, 1:36 PM  Clinical Narrative:     CSW spoke with pt's daughters this AM and confirmed they would like to pursue a bed at [facility]. Per MD, pt will be ready to DC tomorrow, CSW updated facility to delay. TOC will continue to follow for DC needs.   Expected Discharge Plan: Skilled Nursing Facility Barriers to Discharge: SNF Pending bed offer  Expected Discharge Plan and Services In-house Referral: Clinical Social Work   Post Acute Care Choice: Skilled Nursing Facility Living arrangements for the past 2 months: Single Family Home                                       Social Determinants of Health (SDOH) Interventions SDOH Screenings   Food Insecurity: No Food Insecurity (04/12/2023)  Housing: Low Risk  (04/12/2023)  Transportation Needs: No Transportation Needs (04/12/2023)  Utilities: Not At Risk (04/12/2023)  Tobacco Use: Low Risk  (04/12/2023)    Readmission Risk Interventions     No data to display

## 2023-04-19 NOTE — Progress Notes (Signed)
Voiding trial initiated per MD. Foley removed. Pt tolerated well. Will monitor urine output.

## 2023-04-19 NOTE — Progress Notes (Signed)
PROGRESS NOTE    Nancy Bell  ZOX:[id number] DOB: 1924-07-12 DOA: 04/12/2023 PCP: Darrow Bussing, MD    Brief Narrative:  This 89 year old female with history of hypertension, hyperlipidemia, breast cancer who was brought to ED by her family  for the  evaluation of generalized weakness, worsening confusion, difficulty ambulating  at home.   On presentation, she was hemodynamically stable.  Lab work showed WC count of 12.6.  UA not suspicious for UTI.  Chest x-ray did not show any acute findings.  MRI of the brain did not show any acute findings.  Patient admitted for PT/OT evaluation for possible placement to SNF.  Current recommendation is skilled nursing facility.  Hospital course remarkable for urine retention, poor oral intake.  Started on gentle IV fluids.  Ultimate plan is for SNF.   12/8: Remained stable, awaiting SNF placement. 12/9: Foley removed.  She successfully voided.  Assessment & Plan:   Principal Problem:   Weakness Active Problems:   Protein-calorie malnutrition, severe   Generalised Weakness:  Likely from deconditioning, advanced age but it was sudden decline.  She was walking until few days ago before admission.  PT/OT consulted.  Recommendation is for skilled nursing facility on discharge.TOC following.  MRI of the brain did not show any acute findings.  She does not complain of any back pain,  no numbness or loss of sensation on bilateral lower extremities or  no focal deficits.  MRI of the lumbar spine> did not show any acute findings. PT is recommending SNF, pending authorization.   Acute encephalopathy on the background of dementia:  Likely from worsening delirium /cognitive impairment.  She has  underlying dementia.  MRI did not show any acute findings. UA was not  suspicious for UTI.  Has mild leukocytosis but this was most likely reactive, now resolved. Continue delirium precautions, frequent reorientation. Mostly oriented now.  Mental status is at  baseline.   Hypokalemia: Supplemented. Continue to monitor.   AKI/urinary retention: AKI resolved but she was found to have urinary retention . In and out  cath performed with removal of 1200 mL of urine on 12/4.  Started on Flomax.  Ultimately required Foley catheter.  Failed voiding trial on 12/7 so Foley replaced. Ultrasound of the kidney/bladder did not show any acute findings.  She needs to follow-up with urology as an outpatient.  Foley catheter removed and she successfully voided today.  Poor oral intake:   After discussion with family, started on gentle IV fluids.  Kidney function is stable, no elevated BUN. Dietician consulted .  IV fluids discontinued today.   Hyperlipidemia: On statin at home.   Hypertension:  BP is soft, not on any home BP meds at this time.   History of left breast cancer: Status post mastectomy, currently in remission   Nutrition Problem: Severe Malnutrition Etiology: poor appetite, chronic illness   DVT prophylaxis: Lovenox Code Status: Full code Family Communication: Daughters in room Disposition Plan:     Status is: Inpatient Remains inpatient appropriate because:  Admitted with confusion, likely polypharmacy, now improved, awaiting SNF placement   Consultants:  None  Procedures: None  Antimicrobials: Anti-infectives (From admission, onward)    None       Subjective: Patient was seen and examined at bedside.  Overnight events noted. Patient seems back to her baseline mental status.  She is following commands.  She reports feeling very weak.  Objective: Vitals:   04/18/23 1725 04/18/23 2108 04/19/23 0453 04/19/23 0721  BP: (!) 151/69 Marland Kitchen)  160/82 124/70 (!) 143/76  Pulse: 84 98 68 69  Resp: 16 18 18 18   Temp: 97.7 F (36.5 C) 97.6 F (36.4 C) (!) 97.5 F (36.4 C) 97.6 F (36.4 C)  TempSrc:  Oral Oral Oral  SpO2: 98% 91% 97% 100%  Weight:      Height:        Intake/Output Summary (Last 24 hours) at 04/19/2023 1328 Last data  filed at 04/19/2023 1040 Gross per 24 hour  Intake 520 ml  Output 1750 ml  Net -1230 ml   Filed Weights   04/12/23 1151  Weight: 56.2 kg    Examination:  General exam: Appears calm and comfortable, deconditioned, not in any acute distress. Respiratory system: Clear to auscultation. Respiratory effort normal. RR 14 Cardiovascular system: S1 & S2 heard, RRR. No JVD, murmurs, rubs, gallops or clicks. No pedal edema. Gastrointestinal system: Abdomen is non distended, soft and non tender.  Normal bowel sounds heard. Central nervous system: Alert and oriented X 2. No focal neurological deficits. Extremities: No edema, no cyanosis, no clubbing. Skin: No rashes, lesions or ulcers Psychiatry: Judgement and insight appear normal. Mood & affect appropriate.     Data Reviewed: I have personally reviewed following labs and imaging studies  CBC: Recent Labs  Lab 04/13/23 0659  WBC 8.0  HGB 12.8  HCT 39.0  MCV 90.1  PLT 208   Basic Metabolic Panel: Recent Labs  Lab 04/14/23 0631 04/15/23 0518 04/16/23 0612 04/17/23 0428 04/18/23 0758  NA 139 137 137 139 139  K 3.6 3.3* 3.3* 3.1* 3.6  CL 108 105 109 110 109  CO2 23 24 23 23 23   GLUCOSE 131* 96 87 94 94  BUN 13 18 16  7* 6*  CREATININE 0.91 0.89 0.75 0.65 0.69  CALCIUM 8.8* 8.6* 7.8* 7.9* 8.3*  MG 2.1  --  1.9  --   --   PHOS  --   --  2.7  --   --    GFR: Estimated Creatinine Clearance: 32.5 mL/min (by C-G formula based on SCr of 0.69 mg/dL). Liver Function Tests: No results for input(s): "AST", "ALT", "ALKPHOS", "BILITOT", "PROT", "ALBUMIN" in the last 168 hours. No results for input(s): "LIPASE", "AMYLASE" in the last 168 hours. No results for input(s): "AMMONIA" in the last 168 hours. Coagulation Profile: No results for input(s): "INR", "PROTIME" in the last 168 hours. Cardiac Enzymes: No results for input(s): "CKTOTAL", "CKMB", "CKMBINDEX", "TROPONINI" in the last 168 hours. BNP (last 3 results) No results for  input(s): "PROBNP" in the last 8760 hours. HbA1C: No results for input(s): "HGBA1C" in the last 72 hours. CBG: Recent Labs  Lab 04/18/23 0852 04/18/23 1717 04/19/23 0024 04/19/23 0451 04/19/23 1205  GLUCAP 107* 150* 89 94 139*   Lipid Profile: No results for input(s): "CHOL", "HDL", "LDLCALC", "TRIG", "CHOLHDL", "LDLDIRECT" in the last 72 hours. Thyroid Function Tests: No results for input(s): "TSH", "T4TOTAL", "FREET4", "T3FREE", "THYROIDAB" in the last 72 hours. Anemia Panel: No results for input(s): "VITAMINB12", "FOLATE", "FERRITIN", "TIBC", "IRON", "RETICCTPCT" in the last 72 hours. Sepsis Labs: No results for input(s): "PROCALCITON", "LATICACIDVEN" in the last 168 hours.  Recent Results (from the past 240 hour(s))  Resp panel by RT-PCR (RSV, Flu A&B, Covid) Anterior Nasal Swab     Status: None   Collection Time: 04/12/23 11:55 AM   Specimen: Anterior Nasal Swab  Result Value Ref Range Status   SARS Coronavirus 2 by RT PCR NEGATIVE NEGATIVE Final   Influenza A by PCR  NEGATIVE NEGATIVE Final   Influenza B by PCR NEGATIVE NEGATIVE Final    Comment: (NOTE) The Xpert Xpress SARS-CoV-2/FLU/RSV plus assay is intended as an aid in the diagnosis of influenza from Nasopharyngeal swab specimens and should not be used as a sole basis for treatment. Nasal washings and aspirates are unacceptable for Xpert Xpress SARS-CoV-2/FLU/RSV testing.  Fact Sheet for Patients: BloggerCourse.com  Fact Sheet for Healthcare Providers: SeriousBroker.it  This test is not yet approved or cleared by the [place] FDA and has been authorized for detection and/or diagnosis of SARS-CoV-2 by FDA under an Emergency Use Authorization (EUA). This EUA will remain in effect (meaning this test can be used) for the duration of the COVID-19 declaration under Section 564(b)(1) of the Act, 21 U.S.C. section 360bbb-3(b)(1), unless the authorization is  terminated or revoked.     Resp Syncytial Virus by PCR NEGATIVE NEGATIVE Final    Comment: (NOTE) Fact Sheet for Patients: BloggerCourse.com  Fact Sheet for Healthcare Providers: SeriousBroker.it  This test is not yet approved or cleared by the [place] FDA and has been authorized for detection and/or diagnosis of SARS-CoV-2 by FDA under an Emergency Use Authorization (EUA). This EUA will remain in effect (meaning this test can be used) for the duration of the COVID-19 declaration under Section 564(b)(1) of the Act, 21 U.S.C. section 360bbb-3(b)(1), unless the authorization is terminated or revoked.  Performed at [facility], [address] [place], [place] [address]     Radiology Studies: No results found.  Scheduled Meds:  Chlorhexidine Gluconate Cloth  6 each Topical Q0600   enoxaparin (LOVENOX) injection  30 mg Subcutaneous Q24H   feeding supplement  237 mL Oral BID BM   multivitamin with minerals  1 tablet Oral Daily   pantoprazole  40 mg Oral Daily   pravastatin  40 mg Oral q1800   tamsulosin  0.4 mg Oral Daily   Continuous Infusions:  sodium chloride 75 mL/hr at 04/16/23 1608     LOS: 7 days    Time spent: 50 mins    Willeen Niece, MD [facility]   If 7PM-7AM, please contact night-coverage

## 2023-04-20 DIAGNOSIS — K449 Diaphragmatic hernia without obstruction or gangrene: Secondary | ICD-10-CM | POA: Diagnosis not present

## 2023-04-20 DIAGNOSIS — R410 Disorientation, unspecified: Secondary | ICD-10-CM | POA: Diagnosis not present

## 2023-04-20 DIAGNOSIS — E43 Unspecified severe protein-calorie malnutrition: Secondary | ICD-10-CM | POA: Diagnosis not present

## 2023-04-20 DIAGNOSIS — R5381 Other malaise: Secondary | ICD-10-CM | POA: Diagnosis not present

## 2023-04-20 DIAGNOSIS — E785 Hyperlipidemia, unspecified: Secondary | ICD-10-CM | POA: Diagnosis not present

## 2023-04-20 DIAGNOSIS — K219 Gastro-esophageal reflux disease without esophagitis: Secondary | ICD-10-CM | POA: Diagnosis not present

## 2023-04-20 DIAGNOSIS — M810 Age-related osteoporosis without current pathological fracture: Secondary | ICD-10-CM | POA: Diagnosis not present

## 2023-04-20 DIAGNOSIS — F419 Anxiety disorder, unspecified: Secondary | ICD-10-CM | POA: Diagnosis not present

## 2023-04-20 DIAGNOSIS — R627 Adult failure to thrive: Secondary | ICD-10-CM | POA: Diagnosis not present

## 2023-04-20 DIAGNOSIS — R2681 Unsteadiness on feet: Secondary | ICD-10-CM | POA: Diagnosis not present

## 2023-04-20 DIAGNOSIS — Z7401 Bed confinement status: Secondary | ICD-10-CM | POA: Diagnosis not present

## 2023-04-20 DIAGNOSIS — S0990XA Unspecified injury of head, initial encounter: Secondary | ICD-10-CM | POA: Diagnosis not present

## 2023-04-20 DIAGNOSIS — R1084 Generalized abdominal pain: Secondary | ICD-10-CM | POA: Diagnosis not present

## 2023-04-20 DIAGNOSIS — K5730 Diverticulosis of large intestine without perforation or abscess without bleeding: Secondary | ICD-10-CM | POA: Diagnosis not present

## 2023-04-20 DIAGNOSIS — R109 Unspecified abdominal pain: Secondary | ICD-10-CM | POA: Diagnosis not present

## 2023-04-20 DIAGNOSIS — R4182 Altered mental status, unspecified: Secondary | ICD-10-CM | POA: Diagnosis not present

## 2023-04-20 DIAGNOSIS — I10 Essential (primary) hypertension: Secondary | ICD-10-CM | POA: Diagnosis not present

## 2023-04-20 DIAGNOSIS — Z853 Personal history of malignant neoplasm of breast: Secondary | ICD-10-CM | POA: Diagnosis not present

## 2023-04-20 DIAGNOSIS — R531 Weakness: Secondary | ICD-10-CM | POA: Diagnosis not present

## 2023-04-20 DIAGNOSIS — E876 Hypokalemia: Secondary | ICD-10-CM | POA: Diagnosis not present

## 2023-04-20 DIAGNOSIS — L89896 Pressure-induced deep tissue damage of other site: Secondary | ICD-10-CM | POA: Diagnosis not present

## 2023-04-20 DIAGNOSIS — Z9012 Acquired absence of left breast and nipple: Secondary | ICD-10-CM | POA: Diagnosis not present

## 2023-04-20 DIAGNOSIS — R32 Unspecified urinary incontinence: Secondary | ICD-10-CM | POA: Diagnosis not present

## 2023-04-20 DIAGNOSIS — L89890 Pressure ulcer of other site, unstageable: Secondary | ICD-10-CM | POA: Diagnosis not present

## 2023-04-20 DIAGNOSIS — R339 Retention of urine, unspecified: Secondary | ICD-10-CM | POA: Diagnosis not present

## 2023-04-20 DIAGNOSIS — R000 Tachycardia, unspecified: Secondary | ICD-10-CM | POA: Diagnosis not present

## 2023-04-20 DIAGNOSIS — R110 Nausea: Secondary | ICD-10-CM | POA: Diagnosis not present

## 2023-04-20 DIAGNOSIS — M1990 Unspecified osteoarthritis, unspecified site: Secondary | ICD-10-CM | POA: Diagnosis not present

## 2023-04-20 DIAGNOSIS — R0902 Hypoxemia: Secondary | ICD-10-CM | POA: Diagnosis not present

## 2023-04-20 DIAGNOSIS — G9340 Encephalopathy, unspecified: Secondary | ICD-10-CM | POA: Diagnosis not present

## 2023-04-20 DIAGNOSIS — I700 Atherosclerosis of aorta: Secondary | ICD-10-CM | POA: Diagnosis not present

## 2023-04-20 DIAGNOSIS — D259 Leiomyoma of uterus, unspecified: Secondary | ICD-10-CM | POA: Diagnosis not present

## 2023-04-20 DIAGNOSIS — E46 Unspecified protein-calorie malnutrition: Secondary | ICD-10-CM | POA: Diagnosis not present

## 2023-04-20 LAB — BASIC METABOLIC PANEL
Anion gap: 10 (ref 5–15)
BUN: 8 mg/dL (ref 8–23)
CO2: 24 mmol/L (ref 22–32)
Calcium: 8.5 mg/dL — ABNORMAL LOW (ref 8.9–10.3)
Chloride: 107 mmol/L (ref 98–111)
Creatinine, Ser: 0.75 mg/dL (ref 0.44–1.00)
GFR, Estimated: >60 mL/min (ref >60)
Glucose, Bld: 102 mg/dL — ABNORMAL HIGH (ref 70–99)
Potassium: 3.2 mmol/L — ABNORMAL LOW (ref 3.5–5.1)
Sodium: 141 mmol/L (ref 135–145)

## 2023-04-20 LAB — CBC
HCT: 40.2 % (ref 36.0–46.0)
Hemoglobin: 13.4 g/dL (ref 12.0–15.0)
MCH: 29.5 pg (ref 26.0–34.0)
MCHC: 33.3 g/dL (ref 30.0–36.0)
MCV: 88.4 fL (ref 80.0–100.0)
Platelets: 234 10*3/uL (ref 150–400)
RBC: 4.55 MIL/uL (ref 3.87–5.11)
RDW: 12.4 % (ref 11.5–15.5)
WBC: 8.7 10*3/uL (ref 4.0–10.5)
nRBC: 0 % (ref 0.0–0.2)

## 2023-04-20 LAB — GLUCOSE, CAPILLARY
Glucose-Capillary: 121 mg/dL — ABNORMAL HIGH (ref 70–99)
Glucose-Capillary: 123 mg/dL — ABNORMAL HIGH (ref 70–99)

## 2023-04-20 LAB — MAGNESIUM: Magnesium: 2 mg/dL (ref 1.7–2.4)

## 2023-04-20 LAB — PHOSPHORUS: Phosphorus: 2.9 mg/dL (ref 2.5–4.6)

## 2023-04-20 MED ORDER — TAMSULOSIN HCL 0.4 MG PO CAPS: 0.4000 mg | ORAL_CAPSULE | Freq: Every day | ORAL | 0 refills | Status: AC

## 2023-04-20 MED ORDER — POTASSIUM CHLORIDE 20 MEQ PO PACK
40.0000 meq | PACK | Freq: Once | ORAL | Status: AC
  Administered 2023-04-20: 40 meq via ORAL
  Filled 2023-04-20: qty 2

## 2023-04-20 NOTE — Discharge Summary (Signed)
Physician Discharge Summary  Nancy Bell:[id number] DOB: [birth year] DOA: 04/12/2023  PCP: Darrow Bussing, MD  Admit date: 04/12/2023  Discharge date: 04/20/2023  Admitted From: Home  Disposition:  SNF  Recommendations for Outpatient Follow-up:  Follow up with PCP in 1-2 weeks. Please obtain BMP/CBC in one week. Advised to continue current medications as prescribed.  Home Health: None Equipment/Devices: None  Discharge Condition: Stable CODE STATUS: Full code Diet recommendation: Heart Healthy  Brief Summary / Hospital Course: This 89 year old female with history of hypertension, hyperlipidemia, breast cancer who was brought to ED by her family  for the  evaluation of generalized weakness, worsening confusion, difficulty ambulating  at home.   On presentation, she was hemodynamically stable.  Lab work showed WBC count of 12.6.  UA not suspicious for UTI.  Chest x-ray did not show any acute findings.  MRI of the brain did not show any acute findings.  Patient admitted for PT/OT evaluation for possible placement to SNF.  Current recommendation is skilled nursing facility.  Hospital course remarkable for urine retention, requiring Foley catheterization.  She has poor oral intake.  Started on gentle IV fluids.  Foley catheter removed she successfully voided.  Ultimate plan is for SNF.   12/8: Remained stable, awaiting SNF placement. 12/9: Foley removed.  She successfully voided. 12/10: Patient is being discharged to skilled nursing facility.  Discharge Diagnoses:  Principal Problem:   Weakness Active Problems:   Protein-calorie malnutrition, severe  Generalised Weakness:  Likely from deconditioning, advanced age but it was sudden decline. She was walking until few days ago before admission.  PT/OT consulted. Recommendation is for skilled nursing facility on discharge.TOC following.  MRI of the brain did not show any acute findings. She does not complain of any back pain,  no  numbness or loss of sensation on bilateral lower extremities or  no focal deficits.  MRI of the lumbar spine > did not show any acute findings. PT is recommending SNF, pending authorization.   Acute encephalopathy on the background of dementia:  Likely from worsening delirium /cognitive impairment.  She has  underlying dementia.  MRI did not show any acute findings. UA was not  suspicious for UTI.  Has mild leukocytosis but this was most likely reactive, now resolved. Continue delirium precautions, frequent reorientation. Mostly oriented now.  Mental status is at baseline.   Hypokalemia: Supplemented. Continue to monitor.   AKI/urinary retention: AKI resolved but she was found to have urinary retention . In and out  cath performed with removal of 1200 mL of urine on 12/4.  Started on Flomax.  Ultimately required Foley catheter.  Failed voiding trial on 12/7 so Foley replaced. Ultrasound of the kidney/bladder did not show any acute findings.  She needs to follow-up with urology as an outpatient.  Foley catheter removed and she successfully voided today.   Poor oral intake:   After discussion with family, started on gentle IV fluids.  Kidney function is stable, no elevated BUN. Dietician consulted .  IV fluids discontinued today.   Hyperlipidemia: On statin at home.   Hypertension:  BP is soft, not on any home BP meds at this time.   History of left breast cancer: Status post mastectomy, currently in remission   Nutrition Problem: Severe Malnutrition Etiology: poor appetite, chronic illness  Discharge Instructions  Discharge Instructions     Call MD for:  difficulty breathing, headache or visual disturbances   Complete by: As directed    Call MD for:  persistant dizziness or light-headedness   Complete by: As directed    Call MD for:  persistant nausea and vomiting   Complete by: As directed    Diet - low sodium heart healthy   Complete by: As directed    Diet Carb Modified    Complete by: As directed    Discharge instructions   Complete by: As directed    Advised to follow-up with primary care physician in 1 week. Advised to continue current medications as prescribed.   Increase activity slowly   Complete by: As directed       Allergies as of 04/20/2023       Reactions   Augmentin [amoxicillin-pot Clavulanate] Other (See Comments)   Develops C Diff   Vicodin [hydrocodone-acetaminophen] Other (See Comments)   Insomnia    Oxycontin [oxycodone Hcl] Other (See Comments)   Unknown reaction   Tylenol [acetaminophen] Nausea And Vomiting   Chills    Ultram [tramadol Hcl] Other (See Comments)   Unknown reaction        Medication List     STOP taking these medications    amLODipine 2.5 MG tablet Commonly known as: NORVASC   hydrochlorothiazide 25 MG tablet Commonly known as: HYDRODIURIL   ondansetron 4 MG tablet Commonly known as: Zofran       TAKE these medications    ALPRAZolam 0.25 MG tablet Commonly known as: XANAX Take 0.125 mg by mouth 2 (two) times daily.   CO Q 10 PO Take 1 capsule by mouth daily.   loperamide 2 MG tablet Commonly known as: Imodium A-D Take 1 tablet (2 mg total) by mouth 3 (three) times daily as needed for diarrhea or loose stools.   lovastatin 40 MG tablet Commonly known as: MEVACOR Take 40 mg by mouth daily.   naproxen sodium 220 MG tablet Commonly known as: ALEVE Take 220 mg by mouth daily as needed (pain).   omeprazole 20 MG capsule Commonly known as: PRILOSEC Take 20 mg by mouth daily.   Potassium Chloride ER 20 MEQ Tbcr Take 20 mEq by mouth 2 (two) times daily.   tamsulosin 0.4 MG Caps capsule Commonly known as: FLOMAX Take 1 capsule (0.4 mg total) by mouth daily. Start taking on: April 21, 2023        Contact information for follow-up providers     Koirala, Dibas, MD Follow up in 1 week(s).   Specialty: Family Medicine Contact information: [address] [place] [address] [phone]              Contact information for after-discharge care     Destination     [facility], [place] Preferred SNF .   Service: Skilled Nursing Contact information: [address] [place] [address] [phone]                    Allergies  Allergen Reactions   Augmentin [Amoxicillin-Pot Clavulanate] Other (See Comments)    Develops C Diff   Vicodin [Hydrocodone-Acetaminophen] Other (See Comments)    Insomnia    Oxycontin [Oxycodone Hcl] Other (See Comments)    Unknown reaction   Tylenol [Acetaminophen] Nausea And Vomiting    Chills    Ultram [Tramadol Hcl] Other (See Comments)    Unknown reaction    Consultations: None   Procedures/Studies: MR LUMBAR SPINE WO CONTRAST  Result Date: 04/17/2023 CLINICAL DATA:  Lumbar radiculopathy. Chronic left-sided low back pain. Weakness with difficulty ambulating. EXAM: LIMITED MRI  LUMBAR SPINE WITHOUT CONTRAST TECHNIQUE: Sagittal multisequence MR imaging of the lumbar spine was performed. The patient was unable to complete the examination. No axial imaging obtained. No intravenous contrast was administered. COMPARISON:  Or spine radiographs 09/30/2021. Abdominopelvic CT 10/01/2022. FINDINGS: Technical note: Despite efforts by the technologist and patient, mild motion artifact is present on today's exam and could not be eliminated. This reduces exam sensitivity and specificity. As above, the patient was unable to complete the examination. No axial imaging obtained. Segmentation: Conventional anatomy assumed, with the last open disc space designated L5-S1.Concordant with prior imaging. Alignment: There is a convex left scoliosis with a grade 1 degenerative anterolisthesis at L4-5. Vertebrae: No worrisome osseous lesion, acute fracture or pars defect. No evidence of discitis or osteomyelitis. There are mild chronic endplate degenerative changes which are  asymmetric to the right at L2-3. Conus medullaris: Extends to the L1 level and appears normal. Paraspinal and other soft tissues: No significant paraspinal abnormalities identified on sagittal imaging. Disc levels: Sagittal images demonstrate no significant disc space findings within the visualized lower thoracic spine. L1-2: Loss of disc height with annular disc bulging and endplate osteophytes asymmetric to the right. No significant central spinal stenosis suggested on sagittal imaging. There is moderate right and mild left foraminal narrowing. L2-3: Chronic loss of disc height with annular disc bulging and chronic right paracentral osteophytes. Mild spinal stenosis with asymmetric narrowing of the right lateral recess, similar to previous CT. Moderate right and mild left foraminal narrowing appears chronic and similar to previous CT. L3-4: Mild loss of disc height with mild disc bulging and bilateral facet hypertrophy. Moderate to severe right and mild left foraminal narrowing, similar to previous CT. L4-5: Mild loss of disc height with disc bulging, endplate osteophytes and moderate bilateral facet hypertrophy. Probable mild spinal stenosis with mild right and moderate left foraminal narrowing, grossly similar to previous CT. L5-S1: Chronic loss of disc height with annular disc bulging asymmetric to the left. Mild bilateral facet hypertrophy. Mild left foraminal narrowing, grossly similar to previous CT. IMPRESSION: 1. Limited examination due to patient motion and inability to complete the examination. No axial imaging obtained. 2. No acute findings or clear explanation for the patient's symptoms. In comparing the limited sagittal images from today study with a prior abdominal CT of 7 months ago, no gross changes are identified. 3. Multilevel spondylosis with disc bulging, endplate osteophytes and facet hypertrophy as described. There is probable mild chronic spinal stenosis at L2-3 and L4-5. 4. Multilevel  foraminal narrowing as described, most advanced on the right at L2-3 and L3-4 and on the left at L4-5. Electronically Signed   By: Carey Bullocks M.D.   On: 04/17/2023 13:46   US RENAL  Result Date: 04/17/2023 CLINICAL DATA:  Impaired renal function. EXAM: RENAL / URINARY TRACT ULTRASOUND COMPLETE COMPARISON:  None Available. FINDINGS: Right Kidney: Length: 8.7 cm. Echogenicity within normal limits. No mass or hydronephrosis visualized. Left Kidney: Length: 8.8 cm. Echogenicity within normal limits. No mass or hydronephrosis visualized. Bladder: Empty and not evaluated. IMPRESSION: Unremarkable examination of the kidneys. Electronically Signed   By: Layla Maw M.D.   On: 04/17/2023 12:01   MR BRAIN WO CONTRAST  Result Date: 04/12/2023 CLINICAL DATA:  Stroke suspected EXAM: MRI HEAD WITHOUT CONTRAST TECHNIQUE: Multiplanar, multiecho pulse sequences of the brain and surrounding structures were obtained without intravenous contrast. COMPARISON:  None Available. FINDINGS: Brain: No acute infarct, mass effect or extra-axial collection. No chronic microhemorrhage or siderosis. There is multifocal hyperintense  T2-weighted signal within the white matter. Generalized volume loss. The midline structures are normal. Vascular: Normal flow voids. Skull and upper cervical spine: Normal marrow signal. Sinuses/Orbits: Negative. Other: None. IMPRESSION: 1. No acute intracranial abnormality. 2. Findings of chronic small vessel ischemia and volume loss. Electronically Signed   By: Deatra Robinson M.D.   On: 04/12/2023 19:26   DG Chest Portable 1 View  Result Date: 04/12/2023 CLINICAL DATA:  Cough. EXAM: PORTABLE CHEST 1 VIEW COMPARISON:  Chest radiograph dated January 27, 2010. FINDINGS: The heart size and mediastinal contours are within normal limits. Aortic atherosclerosis. Hyperinflation. No focal consolidation, pleural effusion, or pneumothorax. Surgical clips project over the left axilla. No acute osseous  abnormality. IMPRESSION: No acute cardiopulmonary findings. Electronically Signed   By: Hart Robinsons M.D.   On: 04/12/2023 16:04    Subjective: Patient was seen and examined at bedside.  Overnight events noted.   Patient reports doing much better and wants to be discharged.  Discharge Exam: Vitals:   04/20/23 0456 04/20/23 0822  BP: (!) 145/76 (!) 145/84  Pulse: 97 96  Resp:  19  Temp: 98 F (36.7 C) 97.7 F (36.5 C)  SpO2: 98% 97%   Vitals:   04/19/23 0721 04/19/23 2042 04/20/23 0456 04/20/23 0822  BP: (!) 143/76 (!) 144/82 (!) 145/76 (!) 145/84  Pulse: 69 90 97 96  Resp: 18   19  Temp: 97.6 F (36.4 C) 97.6 F (36.4 C) 98 F (36.7 C) 97.7 F (36.5 C)  TempSrc: Oral Oral    SpO2: 100% 98% 98% 97%  Weight:      Height:        General: Pt is alert, awake, not in acute distress Cardiovascular: RRR, S1/S2 +, no rubs, no gallops Respiratory: CTA bilaterally, no wheezing, no rhonchi Abdominal: Soft, NT, ND, bowel sounds + Extremities: no edema, no cyanosis    The results of significant diagnostics from this hospitalization (including imaging, microbiology, ancillary and laboratory) are listed below for reference.     Microbiology: Recent Results (from the past 240 hour(s))  Resp panel by RT-PCR (RSV, Flu A&B, Covid) Anterior Nasal Swab     Status: None   Collection Time: 04/12/23 11:55 AM   Specimen: Anterior Nasal Swab  Result Value Ref Range Status   SARS Coronavirus 2 by RT PCR NEGATIVE NEGATIVE Final   Influenza A by PCR NEGATIVE NEGATIVE Final   Influenza B by PCR NEGATIVE NEGATIVE Final    Comment: (NOTE) The Xpert Xpress SARS-CoV-2/FLU/RSV plus assay is intended as an aid in the diagnosis of influenza from Nasopharyngeal swab specimens and should not be used as a sole basis for treatment. Nasal washings and aspirates are unacceptable for Xpert Xpress SARS-CoV-2/FLU/RSV testing.  Fact Sheet for  Patients: BloggerCourse.com  Fact Sheet for Healthcare Providers: SeriousBroker.it  This test is not yet approved or cleared by the [place] FDA and has been authorized for detection and/or diagnosis of SARS-CoV-2 by FDA under an Emergency Use Authorization (EUA). This EUA will remain in effect (meaning this test can be used) for the duration of the COVID-19 declaration under Section 564(b)(1) of the Act, 21 U.S.C. section 360bbb-3(b)(1), unless the authorization is terminated or revoked.     Resp Syncytial Virus by PCR NEGATIVE NEGATIVE Final    Comment: (NOTE) Fact Sheet for Patients: BloggerCourse.com  Fact Sheet for Healthcare Providers: SeriousBroker.it  This test is not yet approved or cleared by the [place] FDA and has been authorized for detection and/or  diagnosis of SARS-CoV-2 by FDA under an Emergency Use Authorization (EUA). This EUA will remain in effect (meaning this test can be used) for the duration of the COVID-19 declaration under Section 564(b)(1) of the Act, 21 U.S.C. section 360bbb-3(b)(1), unless the authorization is terminated or revoked.  Performed at [facility], [address] [place], [place] [address]      Labs: BNP (last 3 results) No results for input(s): "BNP" in the last 8760 hours. Basic Metabolic Panel: Recent Labs  Lab 04/14/23 0631 04/15/23 0518 04/16/23 0612 04/17/23 0428 04/18/23 0758 04/20/23 0635  NA 139 137 137 139 139 141  K 3.6 3.3* 3.3* 3.1* 3.6 3.2*  CL 108 105 109 110 109 107  CO2 23 24 23 23 23 24   GLUCOSE 131* 96 87 94 94 102*  BUN 13 18 16  7* 6* 8  CREATININE 0.91 0.89 0.75 0.65 0.69 0.75  CALCIUM 8.8* 8.6* 7.8* 7.9* 8.3* 8.5*  MG 2.1  --  1.9  --   --  2.0  PHOS  --   --  2.7  --   --  2.9   Liver Function Tests: No results for input(s): "AST", "ALT", "ALKPHOS", "BILITOT", "PROT", "ALBUMIN" in  the last 168 hours. No results for input(s): "LIPASE", "AMYLASE" in the last 168 hours. No results for input(s): "AMMONIA" in the last 168 hours. CBC: Recent Labs  Lab 04/20/23 0635  WBC 8.7  HGB 13.4  HCT 40.2  MCV 88.4  PLT 234   Cardiac Enzymes: No results for input(s): "CKTOTAL", "CKMB", "CKMBINDEX", "TROPONINI" in the last 168 hours. BNP: Invalid input(s): "POCBNP" CBG: Recent Labs  Lab 04/19/23 0024 04/19/23 0451 04/19/23 1205 04/20/23 0002 04/20/23 0456  GLUCAP 89 94 139* 121* 123*   D-Dimer No results for input(s): "DDIMER" in the last 72 hours. Hgb A1c No results for input(s): "HGBA1C" in the last 72 hours. Lipid Profile No results for input(s): "CHOL", "HDL", "LDLCALC", "TRIG", "CHOLHDL", "LDLDIRECT" in the last 72 hours. Thyroid function studies No results for input(s): "TSH", "T4TOTAL", "T3FREE", "THYROIDAB" in the last 72 hours.  Invalid input(s): "FREET3" Anemia work up No results for input(s): "VITAMINB12", "FOLATE", "FERRITIN", "TIBC", "IRON", "RETICCTPCT" in the last 72 hours. Urinalysis    Component Value Date/Time   COLORURINE YELLOW 04/12/2023 1539   APPEARANCEUR HAZY (A) 04/12/2023 1539   LABSPEC 1.018 04/12/2023 1539   PHURINE 5.0 04/12/2023 1539   GLUCOSEU NEGATIVE 04/12/2023 1539   HGBUR NEGATIVE 04/12/2023 1539   BILIRUBINUR NEGATIVE 04/12/2023 1539   KETONESUR NEGATIVE 04/12/2023 1539   PROTEINUR NEGATIVE 04/12/2023 1539   UROBILINOGEN 0.2 01/27/2010 1015   NITRITE NEGATIVE 04/12/2023 1539   LEUKOCYTESUR NEGATIVE 04/12/2023 1539   Sepsis Labs Recent Labs  Lab 04/20/23 0635  WBC 8.7   Microbiology Recent Results (from the past 240 hour(s))  Resp panel by RT-PCR (RSV, Flu A&B, Covid) Anterior Nasal Swab     Status: None   Collection Time: 04/12/23 11:55 AM   Specimen: Anterior Nasal Swab  Result Value Ref Range Status   SARS Coronavirus 2 by RT PCR NEGATIVE NEGATIVE Final   Influenza A by PCR NEGATIVE NEGATIVE Final    Influenza B by PCR NEGATIVE NEGATIVE Final    Comment: (NOTE) The Xpert Xpress SARS-CoV-2/FLU/RSV plus assay is intended as an aid in the diagnosis of influenza from Nasopharyngeal swab specimens and should not be used as a sole basis for treatment. Nasal washings and aspirates are unacceptable for Xpert Xpress SARS-CoV-2/FLU/RSV testing.  Fact  Sheet for Patients: BloggerCourse.com  Fact Sheet for Healthcare Providers: SeriousBroker.it  This test is not yet approved or cleared by the [place] FDA and has been authorized for detection and/or diagnosis of SARS-CoV-2 by FDA under an Emergency Use Authorization (EUA). This EUA will remain in effect (meaning this test can be used) for the duration of the COVID-19 declaration under Section 564(b)(1) of the Act, 21 U.S.C. section 360bbb-3(b)(1), unless the authorization is terminated or revoked.     Resp Syncytial Virus by PCR NEGATIVE NEGATIVE Final    Comment: (NOTE) Fact Sheet for Patients: BloggerCourse.com  Fact Sheet for Healthcare Providers: SeriousBroker.it  This test is not yet approved or cleared by the [place] FDA and has been authorized for detection and/or diagnosis of SARS-CoV-2 by FDA under an Emergency Use Authorization (EUA). This EUA will remain in effect (meaning this test can be used) for the duration of the COVID-19 declaration under Section 564(b)(1) of the Act, 21 U.S.C. section 360bbb-3(b)(1), unless the authorization is terminated or revoked.  Performed at [facility], [address], [place], [place] [address]      Time coordinating discharge: Over 30 minutes  SIGNED:   Willeen Niece, MD  [facility] 04/20/2023, 1:44 PM Pager   If 7PM-7AM, please contact night-coverage

## 2023-04-20 NOTE — Progress Notes (Signed)
Occupational Therapy Treatment Patient Details Name: Nancy Bell MRN: [id number] DOB: 1925/03/11 Today's Date: 04/20/2023   History of present illness 89 yo presents to [facility] 04/12/23 with confusion and difficulty walking. Admitted w/ acute encephalopathy on background of dementia, MRI negative. PMH HTN HLD anxiety breast CA  total mastectomy   OT comments  Patient received in supine and agreeable to OT session. Patient able to get to EOB with min assist to scoot to EOB. Patient required min assist to stand from EOB and stood for address dizziness before walking to bathroom with min assist and cues for safety. Patient able to perform grooming seated at sink and assistance with UB/LB bathing and dressing. Patient demonstrating gains with transfers and UB/LB bathing and dressing. Patient will benefit from continued inpatient follow up therapy, <3 hours/day for further OT treatment to address bathing, dressing, and functional transfers. Acute OT to continue to follow.       If plan is discharge home, recommend the following:  A lot of help with bathing/dressing/bathroom;Direct supervision/assist for medications management;Direct supervision/assist for financial management;Assist for transportation;Help with stairs or ramp for entrance;Supervision due to cognitive status;A little help with walking and/or transfers   Equipment Recommendations  Other (comment) (defer to next venue of care)    Recommendations for Other Services      Precautions / Restrictions Precautions Precautions: Fall Restrictions Weight Bearing Restrictions: No       Mobility Bed Mobility Overal bed mobility: Needs Assistance Bed Mobility: Supine to Sit     Supine to sit: Min assist, Used rails     General bed mobility comments: assistance to scoot towards EOB    Transfers Overall transfer level: Needs assistance Equipment used: Rolling walker (2 wheels) Transfers: Sit to/from Stand Sit to Stand: Min  assist           General transfer comment: cues for hand placement. increased time to perform due to patient needing to "prepare herself"     Balance Overall balance assessment: Needs assistance Sitting-balance support: No upper extremity supported, Feet supported Sitting balance-Leahy Scale: Fair Sitting balance - Comments: EOB   Standing balance support: Bilateral upper extremity supported, Reliant on assistive device for balance Standing balance-Leahy Scale: Poor Standing balance comment: reliant on RW                           ADL either performed or assessed with clinical judgement   ADL Overall ADL's : Needs assistance/impaired     Grooming: Wash/dry hands;Wash/dry face;Oral care;Brushing hair;Set up;Sitting   Upper Body Bathing: Minimal assistance;Sitting   Lower Body Bathing: Moderate assistance;Sit to/from stand   Upper Body Dressing : Minimal assistance;Sitting   Lower Body Dressing: Maximal assistance;Sitting/lateral leans Lower Body Dressing Details (indicate cue type and reason): able to doff socks, required assistance to [employer] Transfer: Minimal assistance;Regular Toilet;Rolling walker (2 wheels) Toilet Transfer Details (indicate cue type and reason): simulated                Extremity/Trunk Assessment              Vision       Perception     Praxis      Cognition Arousal: Alert Behavior During Therapy: WFL for tasks assessed/performed Overall Cognitive Status: No family/caregiver present to determine baseline cognitive functioning Area of Impairment: Memory, Safety/judgement, Awareness, Problem solving  Memory: Decreased short-term memory Following Commands: Follows one step commands consistently Safety/Judgement: Decreased awareness of safety, Decreased awareness of deficits Awareness: Emergent Problem Solving: Slow processing, Difficulty sequencing General Comments: follows commands with  increased time, occasional cues for sequencing        Exercises      Shoulder Instructions       General Comments VSS on RA    Pertinent Vitals/ Pain       Pain Assessment Pain Assessment: No/denies pain  Home Living                                          Prior Functioning/Environment              Frequency  Min 1X/week        Progress Toward Goals  OT Goals(current goals can now be found in the care plan section)  Progress towards OT goals: Progressing toward goals  Acute Rehab OT Goals Patient Stated Goal: get better OT Goal Formulation: With patient Time For Goal Achievement: 04/28/23 Potential to Achieve Goals: Fair ADL Goals Pt Will Perform Lower Body Bathing: with min assist;sitting/lateral leans;sit to/from stand Pt Will Perform Lower Body Dressing: with min assist;sit to/from stand;sitting/lateral leans Pt Will Transfer to Toilet: with min assist;ambulating;regular height toilet Pt Will Perform Toileting - Clothing Manipulation and hygiene: with min assist;sit to/from stand;sitting/lateral leans Additional ADL Goal #1: Patient will be able to complete functional task in standing for 2-3 minutes prior to needing seated rest break in order increase activty tolerance.  Plan      Co-evaluation                 AM-PAC OT "6 Clicks" Daily Activity     Outcome Measure   Help from another person eating meals?: A Little Help from another person taking care of personal grooming?: A Little Help from another person toileting, which includes using toliet, bedpan, or urinal?: A Lot Help from another person bathing (including washing, rinsing, drying)?: A Lot Help from another person to put on and taking off regular upper body clothing?: A Little Help from another person to put on and taking off regular lower body clothing?: A Lot 6 Click Score: 15    End of Session Equipment Utilized During Treatment: Gait belt;Rolling walker (2  wheels)  OT Visit Diagnosis: Unsteadiness on feet (R26.81);Other abnormalities of gait and mobility (R26.89);Muscle weakness (generalized) (M62.81);Other symptoms and signs involving cognitive function;Adult, failure to thrive (R62.7);Pain   Activity Tolerance Patient tolerated treatment well   Patient Left in chair;with call bell/phone within reach;with chair alarm set   Nurse Communication Mobility status        Time: 1610-9604 OT Time Calculation (min): 38 min  Charges: OT General Charges $OT Visit: 1 Visit OT Treatments $Self Care/Home Management : 38-52 mins  Alfonse Flavors, OTA Acute Rehabilitation Services  Office [phone]   Dewain Penning 04/20/2023, 9:24 AM

## 2023-04-20 NOTE — Progress Notes (Signed)
   04/20/23 0939  Mobility  Activity Ambulated with assistance in room  Level of Assistance Contact guard assist, steadying assist  Assistive Device Front wheel walker  Distance Ambulated (ft) 30 ft  Activity Response Tolerated fair  Mobility Referral Yes  Mobility visit 1 Mobility  Mobility Specialist Start Time (ACUTE ONLY) 0912  Mobility Specialist Stop Time (ACUTE ONLY) 0939  Mobility Specialist Time Calculation (min) (ACUTE ONLY) 27 min   Mobility Specialist: Progress Note  Pre-Mobility: HR 101, BP 132/74 (89) Post-Mobility: HR 109, BP 109/55 (65), SpO2 99% RA  Pt agreeable to mobility session - received in chair. Required CG using RW. C/o dizziness at BOS, feeling "winded" at EOS, VSS.  Returned to chair with all needs met - call bell within reach. Chair alarm on.   Barnie Mort, BS Mobility Specialist Please contact via SecureChat or Rehab office at [phone].

## 2023-04-20 NOTE — TOC Transition Note (Addendum)
Transition of Care [facility]) - CM/SW Discharge Note   Patient Details  Name: Nancy Bell MRN: [id number] Date of Birth: 1924/10/17  [facility] CM/SW Contact:  Carley Hammed, LCSW Phone Number: 04/20/2023, 11:15 AM   Clinical Narrative:    Pt to be transported to [facility] via [facility]. Nurse to call report to [phone].  Family requesting a private bed, one was available yesterday, however pt did not DC so it was filled. CSW was advised by Phineas Semen that a private would be available before Christmas. [facility] updated CSW that they have a bed available and family has accepted. TOC to sign off.  Final next level of care: Skilled Nursing Facility Barriers to Discharge: Barriers Resolved   Patient Goals and CMS Choice CMS Medicare.gov Compare Post Acute Care list provided to:: Patient Represenative (must comment) Choice offered to / list presented to : Adult Children  Discharge Placement                Patient chooses bed at: [facility] Patient to be transferred to facility by: [facility] Name of family member notified: Aurea Graff Patient and family notified of of transfer: 04/20/23  Discharge Plan and Services Additional resources added to the After Visit Summary for   In-house Referral: Clinical Social Work   Post Acute Care Choice: Skilled Nursing Facility                               Social Determinants of Health (SDOH) Interventions SDOH Screenings   Food Insecurity: No Food Insecurity (04/12/2023)  Housing: Low Risk  (04/12/2023)  Transportation Needs: No Transportation Needs (04/12/2023)  Utilities: Not At Risk (04/12/2023)  Tobacco Use: Low Risk  (04/12/2023)     Readmission Risk Interventions     No data to display

## 2023-04-20 NOTE — Discharge Instructions (Signed)
Advised to follow-up with primary care physician in 1 week. Advised to continue current medications as prescribed. 

## 2023-04-20 NOTE — Care Management Important Message (Signed)
Important Message  Patient Details  Name: Nancy Bell MRN: [id number] Date of Birth: 1925/01/07   Important Message Given:  Yes - Medicare IM     Sherilyn Banker 04/20/2023, 2:25 PM

## 2023-04-21 DIAGNOSIS — L89896 Pressure-induced deep tissue damage of other site: Secondary | ICD-10-CM | POA: Diagnosis not present

## 2023-04-23 DIAGNOSIS — E46 Unspecified protein-calorie malnutrition: Secondary | ICD-10-CM | POA: Diagnosis not present

## 2023-04-23 DIAGNOSIS — R5381 Other malaise: Secondary | ICD-10-CM | POA: Diagnosis not present

## 2023-04-23 DIAGNOSIS — R2681 Unsteadiness on feet: Secondary | ICD-10-CM | POA: Diagnosis not present

## 2023-04-28 DIAGNOSIS — L89896 Pressure-induced deep tissue damage of other site: Secondary | ICD-10-CM | POA: Diagnosis not present

## 2023-05-03 DIAGNOSIS — L89896 Pressure-induced deep tissue damage of other site: Secondary | ICD-10-CM | POA: Diagnosis not present

## 2023-05-08 ENCOUNTER — Encounter (HOSPITAL_COMMUNITY): Payer: Self-pay

## 2023-05-08 ENCOUNTER — Emergency Department (HOSPITAL_COMMUNITY): Payer: Medicare Other

## 2023-05-08 ENCOUNTER — Other Ambulatory Visit: Payer: Self-pay

## 2023-05-08 ENCOUNTER — Emergency Department (HOSPITAL_COMMUNITY)
Admission: EM | Admit: 2023-05-08 | Discharge: 2023-05-08 | Disposition: A | Discharge status: Home / Self Care | Payer: Medicare Other | Attending: Student in an Organized Health Care Education/Training Program | Admitting: Student in an Organized Health Care Education/Training Program

## 2023-05-08 DIAGNOSIS — I700 Atherosclerosis of aorta: Secondary | ICD-10-CM | POA: Diagnosis not present

## 2023-05-08 DIAGNOSIS — R339 Retention of urine, unspecified: Secondary | ICD-10-CM | POA: Insufficient documentation

## 2023-05-08 DIAGNOSIS — D259 Leiomyoma of uterus, unspecified: Secondary | ICD-10-CM | POA: Diagnosis not present

## 2023-05-08 DIAGNOSIS — R109 Unspecified abdominal pain: Secondary | ICD-10-CM | POA: Insufficient documentation

## 2023-05-08 DIAGNOSIS — R4182 Altered mental status, unspecified: Secondary | ICD-10-CM | POA: Diagnosis not present

## 2023-05-08 DIAGNOSIS — R627 Adult failure to thrive: Secondary | ICD-10-CM | POA: Diagnosis not present

## 2023-05-08 DIAGNOSIS — R410 Disorientation, unspecified: Secondary | ICD-10-CM | POA: Diagnosis not present

## 2023-05-08 DIAGNOSIS — Z9012 Acquired absence of left breast and nipple: Secondary | ICD-10-CM | POA: Diagnosis not present

## 2023-05-08 DIAGNOSIS — K5730 Diverticulosis of large intestine without perforation or abscess without bleeding: Secondary | ICD-10-CM | POA: Diagnosis not present

## 2023-05-08 DIAGNOSIS — K449 Diaphragmatic hernia without obstruction or gangrene: Secondary | ICD-10-CM | POA: Diagnosis not present

## 2023-05-08 DIAGNOSIS — S0990XA Unspecified injury of head, initial encounter: Secondary | ICD-10-CM | POA: Diagnosis not present

## 2023-05-08 LAB — COMPREHENSIVE METABOLIC PANEL
ALT: 14 U/L (ref 0–44)
AST: 37 U/L (ref 15–41)
Albumin: 3.5 g/dL (ref 3.5–5.0)
Alkaline Phosphatase: 101 U/L (ref 38–126)
Anion gap: 10 (ref 5–15)
BUN: 12 mg/dL (ref 8–23)
CO2: 25 mmol/L (ref 22–32)
Calcium: 9.1 mg/dL (ref 8.9–10.3)
Chloride: 105 mmol/L (ref 98–111)
Creatinine, Ser: 0.8 mg/dL (ref 0.44–1.00)
GFR, Estimated: >60 mL/min (ref >60)
Glucose, Bld: 94 mg/dL (ref 70–99)
Potassium: 3.7 mmol/L (ref 3.5–5.1)
Sodium: 140 mmol/L (ref 135–145)
Total Bilirubin: 0.7 mg/dL (ref <1.2)
Total Protein: 6.9 g/dL (ref 6.5–8.1)

## 2023-05-08 LAB — CBC
HCT: 42.2 % (ref 36.0–46.0)
Hemoglobin: 14.1 g/dL (ref 12.0–15.0)
MCH: 30.1 pg (ref 26.0–34.0)
MCHC: 33.4 g/dL (ref 30.0–36.0)
MCV: 90 fL (ref 80.0–100.0)
Platelets: 258 10*3/uL (ref 150–400)
RBC: 4.69 MIL/uL (ref 3.87–5.11)
RDW: 12.5 % (ref 11.5–15.5)
WBC: 9.7 10*3/uL (ref 4.0–10.5)
nRBC: 0 % (ref 0.0–0.2)

## 2023-05-08 LAB — URINALYSIS, ROUTINE W REFLEX MICROSCOPIC
Bilirubin Urine: NEGATIVE
Glucose, UA: NEGATIVE mg/dL
Hgb urine dipstick: NEGATIVE
Ketones, ur: NEGATIVE mg/dL
Nitrite: NEGATIVE
Protein, ur: NEGATIVE mg/dL
Specific Gravity, Urine: 1.013 (ref 1.005–1.030)
pH: 8 (ref 5.0–8.0)

## 2023-05-08 LAB — I-STAT CG4 LACTIC ACID, ED: Lactic Acid, Venous: 1.4 mmol/L (ref 0.5–1.9)

## 2023-05-08 LAB — LIPASE, BLOOD: Lipase: 25 U/L (ref 11–51)

## 2023-05-08 MED ORDER — LACTATED RINGERS IV BOLUS
1000.0000 mL | Freq: Once | INTRAVENOUS | Status: AC
  Administered 2023-05-08: 1000 mL via INTRAVENOUS

## 2023-05-08 MED ORDER — IOHEXOL 300 MG/ML  SOLN
100.0000 mL | Freq: Once | INTRAMUSCULAR | Status: AC | PRN
  Administered 2023-05-08: 100 mL via INTRAVENOUS

## 2023-05-08 NOTE — ED Notes (Signed)
PT[facility] called  

## 2023-05-08 NOTE — ED Provider Notes (Signed)
[place] EMERGENCY DEPARTMENT AT [facility] Provider Note   CSN: [id number] Arrival date & time: 05/08/23  1712     History  Chief Complaint  Patient presents with   Abdominal Pain   Failure To Thrive    Nancy Bell is a 89 y.o. female.  89 year old female brought in from her SNF due to abdominal discomfort, altered mental status, and failure to thrive. Patient was recently admitted for altered mental status and had an MRI done this month that showed no acute findings.  She was noted to have urinary retention during that hospital stay and was sent to a nursing facility.  Family reports that she has been complaining of some abdominal discomfort and increased confusion.  Nursing care facility was reportedly concerned for low blood pressure, however blood pressure per EMS was within normal limits.  Family states that she has had multiple falls over the last few days at the SNF.    Abdominal Pain      Home Medications Prior to Admission medications   Medication Sig Start Date End Date Taking? Authorizing Provider  ALPRAZolam (XANAX) 0.25 MG tablet Take 0.125 mg by mouth 2 (two) times daily. 01/23/15   [provider]  Coenzyme Q10 (CO Q 10 PO) Take 1 capsule by mouth daily.    [provider]  loperamide (IMODIUM A-D) 2 MG tablet Take 1 tablet (2 mg total) by mouth 3 (three) times daily as needed for diarrhea or loose stools. 10/02/22   Pokhrel, Rebekah Chesterfield, MD  lovastatin (MEVACOR) 40 MG tablet Take 40 mg by mouth daily. 01/31/15   [provider]  naproxen sodium (ALEVE) 220 MG tablet Take 220 mg by mouth daily as needed (pain).    [provider]  omeprazole (PRILOSEC) 20 MG capsule Take 20 mg by mouth daily.    [provider]  Potassium Chloride ER 20 MEQ TBCR Take 20 mEq by mouth 2 (two) times daily. 03/10/23   [provider]  tamsulosin (FLOMAX) 0.4 MG CAPS capsule Take 1 capsule (0.4 mg total) by mouth daily.  04/21/23 05/21/23  Willeen Niece, MD      Allergies    Augmentin [amoxicillin-pot clavulanate], Vicodin [hydrocodone-acetaminophen], Oxycontin [oxycodone hcl], Tylenol [acetaminophen], and Ultram [tramadol hcl]    Review of Systems   Review of Systems  Gastrointestinal:  Positive for abdominal pain.  All other systems reviewed and are negative.   Physical Exam Updated Vital Signs BP 124/62   Pulse 85   Temp 97.8 F (36.6 C) (Oral)   Resp 17   Ht 5\' 2"  (1.575 m)   Wt 56 kg   SpO2 97%   BMI 22.58 kg/m  Physical Exam Vitals reviewed.  Constitutional:      Comments: Frail, elderly, confused  HENT:     Mouth/Throat:     Comments: Dry mm Cardiovascular:     Rate and Rhythm: Normal rate.  Pulmonary:     Effort: Pulmonary effort is normal.  Abdominal:     General: There is distension.     Tenderness: There is abdominal tenderness in the epigastric area.  Skin:    General: Skin is dry.  Neurological:     Mental Status: She is disoriented.     ED Results / Procedures / Treatments   Labs (all labs ordered are listed, but only abnormal results are displayed) Labs Reviewed  URINALYSIS, ROUTINE W REFLEX MICROSCOPIC - Abnormal; Notable for the following components:      Result Value  Leukocytes,Ua TRACE (*)    Bacteria, UA RARE (*)    All other components within normal limits  LIPASE, BLOOD  COMPREHENSIVE METABOLIC PANEL  CBC  I-STAT CG4 LACTIC ACID, ED    EKG None  Radiology CT Head Wo Contrast Result Date: 05/08/2023 CLINICAL DATA:  Head trauma, moderate-severe EXAM: CT HEAD WITHOUT CONTRAST TECHNIQUE: Contiguous axial images were obtained from the base of the skull through the vertex without intravenous contrast. RADIATION DOSE REDUCTION: This exam was performed according to the departmental dose-optimization program which includes automated exposure control, adjustment of the mA and/or kV according to patient size and/or use of iterative reconstruction  technique. COMPARISON:  None Available. FINDINGS: Brain: Cerebral ventricle sizes are concordant with the degree of cerebral volume loss. Patchy and confluent areas of decreased attenuation are noted throughout the deep and periventricular white matter of the cerebral hemispheres bilaterally, compatible with chronic microvascular ischemic disease. No evidence of large-territorial acute infarction. No parenchymal hemorrhage. No mass lesion. No extra-axial collection. No mass effect or midline shift. No hydrocephalus. Basilar cisterns are patent. Vascular: No hyperdense vessel. Skull: No acute fracture or focal lesion. Sinuses/Orbits: Paranasal sinuses and mastoid air cells are clear. The orbits are unremarkable. Other: None. IMPRESSION: No acute intracranial abnormality. Electronically Signed   By: Tish Frederickson M.D.   On: 05/08/2023 19:43   CT ABDOMEN PELVIS W CONTRAST Result Date: 05/08/2023 CLINICAL DATA:  Abdominal pain, acute, nonlocalized Pt normotensive with EMS. Pt c/o severe central abd pain that began lst night. Abdomen is tender to the touch. EXAM: CT ABDOMEN AND PELVIS WITH CONTRAST TECHNIQUE: Multidetector CT imaging of the abdomen and pelvis was performed using the standard protocol following bolus administration of intravenous contrast. RADIATION DOSE REDUCTION: This exam was performed according to the departmental dose-optimization program which includes automated exposure control, adjustment of the mA and/or kV according to patient size and/or use of iterative reconstruction technique. CONTRAST:  OMNIPAQUE IOHEXOL 300 MG/ML  SOLN COMPARISON:  None Available. FINDINGS: Lower chest: Large volume hiatal hernia containing the majority of the stomach. Hepatobiliary: The hepatic parenchyma is diffusely hypodense compared to the splenic parenchyma consistent with fatty infiltration. Right hepatic lobe pericentimeter fluid density lesion likely represents a simple hepatic cyst. No gallstones,  gallbladder wall thickening, or pericholecystic fluid. No biliary dilatation. Pancreas: Diffusely atrophic. No focal lesion. Otherwise normal pancreatic contour. No surrounding inflammatory changes. No main pancreatic ductal dilatation. Spleen: Normal in size without focal abnormality.  Subcentimeter Adrenals/Urinary Tract: No adrenal nodule bilaterally. Bilateral kidneys enhance symmetrically. Hypodensity left kidney too small to characterize-no further follow-up indicated. Mild fullness of the bilateral collecting systems. No hydronephrosis. No hydroureter. No nephroureterolithiasis. The urinary bladder is distended with urine. On delayed imaging, there is no urothelial wall thickening and there are no filling defects in the opacified portions of the bilateral collecting systems or ureters. Stomach/Bowel: Stomach is within normal limits. No evidence of bowel wall thickening or dilatation. Colonic diverticulosis. Appendix appears normal. Vascular/Lymphatic: No abdominal aorta or iliac aneurysm. Severe atherosclerotic plaque of the aorta and its branches. No abdominal, pelvic, or inguinal lymphadenopathy. Reproductive: Lobulated enlarged uterine contour with multiple calcified and noncalcified lesions likely representing uterine fibroids. Largest measures up to 5 cm. Otherwise uterus and bilateral adnexa are unremarkable. Other: No intraperitoneal free fluid. No intraperitoneal free gas. No organized fluid collection. Musculoskeletal: No abdominal wall hernia or abnormality. No suspicious lytic or blastic osseous lesions. No acute displaced fracture. Multilevel degenerative changes of the spine. Grade 1 anterolisthesis of L4  on L5. Posterior disc osteophyte complex formation at the L1-L2 and L3 levels with associated mild to moderate osseous central canal stenosis at the L2-L3 level. No severe osseous neural foraminal or central canal stenosis. IMPRESSION: 1. Urinary bladder lumen distended with urine with mild  fullness of the collecting systems bilaterally. 2. Large hiatal hernia containing the majority of the stomach. 3. Colonic diverticulosis with no acute diverticulitis. 4. Uterine fibroids. 5.  Aortic Atherosclerosis (ICD10-I70.0). Electronically Signed   By: Tish Frederickson M.D.   On: 05/08/2023 19:41   DG Chest Portable 1 View Result Date: 05/08/2023 CLINICAL DATA:  Fall, altered mental status. EXAM: PORTABLE CHEST 1 VIEW COMPARISON:  04/12/2023. FINDINGS: Cardiac silhouette unremarkable. Calcified aorta. Thoracic degenerative changes. Status post left mastectomy. Lungs are clear. No pneumothorax. Retrocardiac opacity consistent with a hiatal hernia. IMPRESSION: No acute cardiopulmonary process. Hiatal hernia. Electronically Signed   By: Layla Maw M.D.   On: 05/08/2023 19:08    Procedures Procedures    Medications Ordered in ED Medications  lactated ringers bolus 1,000 mL (1,000 mLs Intravenous New Bag/Given 05/08/23 1811)  iohexol (OMNIPAQUE) 300 MG/ML solution 100 mL (100 mLs Intravenous Contrast Given 05/08/23 1912)    ED Course/ Medical Decision Making/ A&P                                 Medical Decision Making 89 year old female brought in by family due to concerns about abdominal discomfort and worsening mental status.  Patient is very elderly appearing and has discomfort upon evaluation of her abdomen.  She has no obvious deformities to her extremities or head trauma from the falls.  Blood pressure has been within normal limits and she is otherwise hemodynamically stable.  She did recently have an MRI of the brain performed this month and that showed no acute process.  Family concerned about her declining ability to care for self, however at this seems to be ongoing over the last few weeks. Lab work and imaging ordered to rule out any acute process.  Lab work shows no evidence of UTI, normal CBC, normal CMP, and normal lactic acid.  CT of the head unremarkable.  Chest x-ray  shows no acute abnormalities.  CT of the abdomen/pelvis shows a distended urinary bladder consistent with her recurrent urinary retention.  No acute abnormalities other than the urinary retention found.  Her discomfort and mental status change likely secondary to urinary retention.  We will place a Foley and plan to DC her back to her SNF.  Amount and/or Complexity of Data Reviewed Labs: ordered. Radiology: ordered.  Risk Prescription drug management.    Final Clinical Impression(s) / ED Diagnoses Final diagnoses:  Urinary retention    Rx / DC Orders ED Discharge Orders          Ordered    Ambulatory referral to Urology        05/08/23 2109              Samantha Crimes, DO 05/08/23 2113

## 2023-05-08 NOTE — ED Notes (Signed)
Patient transported to CT 

## 2023-05-08 NOTE — ED Triage Notes (Signed)
Pt BIBA from [facility]. EMS called out d/t hypotension and FTT. Pt normotensive with EMS. Pt c/o severe central abd pain that began lst night. Abdomen is tender to the touch.  Pt at baseline, AOx3

## 2023-05-08 NOTE — Discharge Instructions (Addendum)
The workup today did not show any acute abnormalities other than urinary retention.  This is likely the cause of her abdominal discomfort and change in behavior.  We will place a Foley catheter that needs to stay in place since she is having recurrent retention.  I have sent a referral to urology.  The SNF will be able to manage the Foley catheter.

## 2023-05-28 DIAGNOSIS — M1990 Unspecified osteoarthritis, unspecified site: Secondary | ICD-10-CM | POA: Diagnosis not present

## 2023-05-28 DIAGNOSIS — M47816 Spondylosis without myelopathy or radiculopathy, lumbar region: Secondary | ICD-10-CM | POA: Diagnosis not present

## 2023-05-28 DIAGNOSIS — R339 Retention of urine, unspecified: Secondary | ICD-10-CM | POA: Diagnosis not present

## 2023-05-28 DIAGNOSIS — E876 Hypokalemia: Secondary | ICD-10-CM | POA: Diagnosis not present

## 2023-05-28 DIAGNOSIS — K219 Gastro-esophageal reflux disease without esophagitis: Secondary | ICD-10-CM | POA: Diagnosis not present

## 2023-05-28 DIAGNOSIS — E785 Hyperlipidemia, unspecified: Secondary | ICD-10-CM | POA: Diagnosis not present

## 2023-05-28 DIAGNOSIS — M2578 Osteophyte, vertebrae: Secondary | ICD-10-CM | POA: Diagnosis not present

## 2023-05-28 DIAGNOSIS — F0390 Unspecified dementia without behavioral disturbance: Secondary | ICD-10-CM | POA: Diagnosis not present

## 2023-05-28 DIAGNOSIS — C50912 Malignant neoplasm of unspecified site of left female breast: Secondary | ICD-10-CM | POA: Diagnosis not present

## 2023-05-28 DIAGNOSIS — E46 Unspecified protein-calorie malnutrition: Secondary | ICD-10-CM | POA: Diagnosis not present

## 2023-05-28 DIAGNOSIS — Z9012 Acquired absence of left breast and nipple: Secondary | ICD-10-CM | POA: Diagnosis not present

## 2023-05-28 DIAGNOSIS — E43 Unspecified severe protein-calorie malnutrition: Secondary | ICD-10-CM | POA: Diagnosis not present

## 2023-05-28 DIAGNOSIS — M51369 Other intervertebral disc degeneration, lumbar region without mention of lumbar back pain or lower extremity pain: Secondary | ICD-10-CM | POA: Diagnosis not present

## 2023-05-28 DIAGNOSIS — C50911 Malignant neoplasm of unspecified site of right female breast: Secondary | ICD-10-CM | POA: Diagnosis not present

## 2023-05-28 DIAGNOSIS — F419 Anxiety disorder, unspecified: Secondary | ICD-10-CM | POA: Diagnosis not present

## 2023-05-28 DIAGNOSIS — M48061 Spinal stenosis, lumbar region without neurogenic claudication: Secondary | ICD-10-CM | POA: Diagnosis not present

## 2023-05-28 DIAGNOSIS — F0394 Unspecified dementia, unspecified severity, with anxiety: Secondary | ICD-10-CM | POA: Diagnosis not present

## 2023-05-28 DIAGNOSIS — M810 Age-related osteoporosis without current pathological fracture: Secondary | ICD-10-CM | POA: Diagnosis not present

## 2023-05-28 DIAGNOSIS — Z8616 Personal history of COVID-19: Secondary | ICD-10-CM | POA: Diagnosis not present

## 2023-05-28 DIAGNOSIS — Z853 Personal history of malignant neoplasm of breast: Secondary | ICD-10-CM | POA: Diagnosis not present

## 2023-05-28 DIAGNOSIS — U071 COVID-19: Secondary | ICD-10-CM | POA: Diagnosis not present

## 2023-05-28 DIAGNOSIS — I10 Essential (primary) hypertension: Secondary | ICD-10-CM | POA: Diagnosis not present

## 2023-05-28 DIAGNOSIS — Z9181 History of falling: Secondary | ICD-10-CM | POA: Diagnosis not present

## 2023-05-28 DIAGNOSIS — K449 Diaphragmatic hernia without obstruction or gangrene: Secondary | ICD-10-CM | POA: Diagnosis not present

## 2023-06-01 DIAGNOSIS — E785 Hyperlipidemia, unspecified: Secondary | ICD-10-CM | POA: Diagnosis not present

## 2023-06-01 DIAGNOSIS — M810 Age-related osteoporosis without current pathological fracture: Secondary | ICD-10-CM | POA: Diagnosis not present

## 2023-06-01 DIAGNOSIS — C50912 Malignant neoplasm of unspecified site of left female breast: Secondary | ICD-10-CM | POA: Diagnosis not present

## 2023-06-01 DIAGNOSIS — I10 Essential (primary) hypertension: Secondary | ICD-10-CM | POA: Diagnosis not present

## 2023-06-01 DIAGNOSIS — E876 Hypokalemia: Secondary | ICD-10-CM | POA: Diagnosis not present

## 2023-06-01 DIAGNOSIS — F419 Anxiety disorder, unspecified: Secondary | ICD-10-CM | POA: Diagnosis not present

## 2023-06-01 DIAGNOSIS — Z7189 Other specified counseling: Secondary | ICD-10-CM | POA: Diagnosis not present

## 2023-06-01 DIAGNOSIS — F03B0 Unspecified dementia, moderate, without behavioral disturbance, psychotic disturbance, mood disturbance, and anxiety: Secondary | ICD-10-CM | POA: Diagnosis not present

## 2023-06-01 DIAGNOSIS — E43 Unspecified severe protein-calorie malnutrition: Secondary | ICD-10-CM | POA: Diagnosis not present

## 2023-06-01 DIAGNOSIS — E46 Unspecified protein-calorie malnutrition: Secondary | ICD-10-CM | POA: Diagnosis not present

## 2023-06-01 DIAGNOSIS — F0394 Unspecified dementia, unspecified severity, with anxiety: Secondary | ICD-10-CM | POA: Diagnosis not present

## 2023-06-01 DIAGNOSIS — Z889 Allergy status to unspecified drugs, medicaments and biological substances status: Secondary | ICD-10-CM | POA: Diagnosis not present

## 2023-06-01 DIAGNOSIS — M51369 Other intervertebral disc degeneration, lumbar region without mention of lumbar back pain or lower extremity pain: Secondary | ICD-10-CM | POA: Diagnosis not present

## 2023-06-01 DIAGNOSIS — Z9012 Acquired absence of left breast and nipple: Secondary | ICD-10-CM | POA: Diagnosis not present

## 2023-06-01 DIAGNOSIS — U071 COVID-19: Secondary | ICD-10-CM | POA: Diagnosis not present

## 2023-06-03 DIAGNOSIS — E559 Vitamin D deficiency, unspecified: Secondary | ICD-10-CM | POA: Diagnosis not present

## 2023-06-03 DIAGNOSIS — M810 Age-related osteoporosis without current pathological fracture: Secondary | ICD-10-CM | POA: Diagnosis not present

## 2023-06-03 DIAGNOSIS — F0394 Unspecified dementia, unspecified severity, with anxiety: Secondary | ICD-10-CM | POA: Diagnosis not present

## 2023-06-03 DIAGNOSIS — E43 Unspecified severe protein-calorie malnutrition: Secondary | ICD-10-CM | POA: Diagnosis not present

## 2023-06-03 DIAGNOSIS — I10 Essential (primary) hypertension: Secondary | ICD-10-CM | POA: Diagnosis not present

## 2023-06-03 DIAGNOSIS — M51369 Other intervertebral disc degeneration, lumbar region without mention of lumbar back pain or lower extremity pain: Secondary | ICD-10-CM | POA: Diagnosis not present

## 2023-06-03 DIAGNOSIS — F419 Anxiety disorder, unspecified: Secondary | ICD-10-CM | POA: Diagnosis not present

## 2023-06-08 DIAGNOSIS — M810 Age-related osteoporosis without current pathological fracture: Secondary | ICD-10-CM | POA: Diagnosis not present

## 2023-06-08 DIAGNOSIS — F419 Anxiety disorder, unspecified: Secondary | ICD-10-CM | POA: Diagnosis not present

## 2023-06-08 DIAGNOSIS — F0394 Unspecified dementia, unspecified severity, with anxiety: Secondary | ICD-10-CM | POA: Diagnosis not present

## 2023-06-08 DIAGNOSIS — E43 Unspecified severe protein-calorie malnutrition: Secondary | ICD-10-CM | POA: Diagnosis not present

## 2023-06-08 DIAGNOSIS — I10 Essential (primary) hypertension: Secondary | ICD-10-CM | POA: Diagnosis not present

## 2023-06-08 DIAGNOSIS — M51369 Other intervertebral disc degeneration, lumbar region without mention of lumbar back pain or lower extremity pain: Secondary | ICD-10-CM | POA: Diagnosis not present

## 2023-06-18 DIAGNOSIS — E43 Unspecified severe protein-calorie malnutrition: Secondary | ICD-10-CM | POA: Diagnosis not present

## 2023-06-18 DIAGNOSIS — I10 Essential (primary) hypertension: Secondary | ICD-10-CM | POA: Diagnosis not present

## 2023-06-18 DIAGNOSIS — F0394 Unspecified dementia, unspecified severity, with anxiety: Secondary | ICD-10-CM | POA: Diagnosis not present

## 2023-06-18 DIAGNOSIS — M810 Age-related osteoporosis without current pathological fracture: Secondary | ICD-10-CM | POA: Diagnosis not present

## 2023-06-18 DIAGNOSIS — M51369 Other intervertebral disc degeneration, lumbar region without mention of lumbar back pain or lower extremity pain: Secondary | ICD-10-CM | POA: Diagnosis not present

## 2023-06-18 DIAGNOSIS — F419 Anxiety disorder, unspecified: Secondary | ICD-10-CM | POA: Diagnosis not present

## 2023-06-22 DIAGNOSIS — F419 Anxiety disorder, unspecified: Secondary | ICD-10-CM | POA: Diagnosis not present

## 2023-06-22 DIAGNOSIS — E43 Unspecified severe protein-calorie malnutrition: Secondary | ICD-10-CM | POA: Diagnosis not present

## 2023-06-22 DIAGNOSIS — I10 Essential (primary) hypertension: Secondary | ICD-10-CM | POA: Diagnosis not present

## 2023-06-22 DIAGNOSIS — M810 Age-related osteoporosis without current pathological fracture: Secondary | ICD-10-CM | POA: Diagnosis not present

## 2023-06-22 DIAGNOSIS — F0394 Unspecified dementia, unspecified severity, with anxiety: Secondary | ICD-10-CM | POA: Diagnosis not present

## 2023-06-22 DIAGNOSIS — M51369 Other intervertebral disc degeneration, lumbar region without mention of lumbar back pain or lower extremity pain: Secondary | ICD-10-CM | POA: Diagnosis not present

## 2023-06-24 DIAGNOSIS — F0394 Unspecified dementia, unspecified severity, with anxiety: Secondary | ICD-10-CM | POA: Diagnosis not present

## 2023-06-27 DIAGNOSIS — I10 Essential (primary) hypertension: Secondary | ICD-10-CM | POA: Diagnosis not present

## 2023-06-27 DIAGNOSIS — Z9012 Acquired absence of left breast and nipple: Secondary | ICD-10-CM | POA: Diagnosis not present

## 2023-06-27 DIAGNOSIS — Z8616 Personal history of COVID-19: Secondary | ICD-10-CM | POA: Diagnosis not present

## 2023-06-27 DIAGNOSIS — E43 Unspecified severe protein-calorie malnutrition: Secondary | ICD-10-CM | POA: Diagnosis not present

## 2023-06-27 DIAGNOSIS — M810 Age-related osteoporosis without current pathological fracture: Secondary | ICD-10-CM | POA: Diagnosis not present

## 2023-06-27 DIAGNOSIS — E785 Hyperlipidemia, unspecified: Secondary | ICD-10-CM | POA: Diagnosis not present

## 2023-06-27 DIAGNOSIS — M48061 Spinal stenosis, lumbar region without neurogenic claudication: Secondary | ICD-10-CM | POA: Diagnosis not present

## 2023-06-27 DIAGNOSIS — M2578 Osteophyte, vertebrae: Secondary | ICD-10-CM | POA: Diagnosis not present

## 2023-06-27 DIAGNOSIS — M51369 Other intervertebral disc degeneration, lumbar region without mention of lumbar back pain or lower extremity pain: Secondary | ICD-10-CM | POA: Diagnosis not present

## 2023-06-27 DIAGNOSIS — K219 Gastro-esophageal reflux disease without esophagitis: Secondary | ICD-10-CM | POA: Diagnosis not present

## 2023-06-27 DIAGNOSIS — Z9181 History of falling: Secondary | ICD-10-CM | POA: Diagnosis not present

## 2023-06-27 DIAGNOSIS — Z853 Personal history of malignant neoplasm of breast: Secondary | ICD-10-CM | POA: Diagnosis not present

## 2023-06-27 DIAGNOSIS — F0394 Unspecified dementia, unspecified severity, with anxiety: Secondary | ICD-10-CM | POA: Diagnosis not present

## 2023-06-27 DIAGNOSIS — K449 Diaphragmatic hernia without obstruction or gangrene: Secondary | ICD-10-CM | POA: Diagnosis not present

## 2023-06-27 DIAGNOSIS — M47816 Spondylosis without myelopathy or radiculopathy, lumbar region: Secondary | ICD-10-CM | POA: Diagnosis not present

## 2023-06-27 DIAGNOSIS — M1990 Unspecified osteoarthritis, unspecified site: Secondary | ICD-10-CM | POA: Diagnosis not present

## 2023-06-27 DIAGNOSIS — F419 Anxiety disorder, unspecified: Secondary | ICD-10-CM | POA: Diagnosis not present

## 2023-06-29 DIAGNOSIS — F419 Anxiety disorder, unspecified: Secondary | ICD-10-CM | POA: Diagnosis not present

## 2023-06-29 DIAGNOSIS — I10 Essential (primary) hypertension: Secondary | ICD-10-CM | POA: Diagnosis not present

## 2023-06-29 DIAGNOSIS — M810 Age-related osteoporosis without current pathological fracture: Secondary | ICD-10-CM | POA: Diagnosis not present

## 2023-06-29 DIAGNOSIS — M51369 Other intervertebral disc degeneration, lumbar region without mention of lumbar back pain or lower extremity pain: Secondary | ICD-10-CM | POA: Diagnosis not present

## 2023-06-29 DIAGNOSIS — F0394 Unspecified dementia, unspecified severity, with anxiety: Secondary | ICD-10-CM | POA: Diagnosis not present

## 2023-06-29 DIAGNOSIS — E43 Unspecified severe protein-calorie malnutrition: Secondary | ICD-10-CM | POA: Diagnosis not present

## 2023-07-06 DIAGNOSIS — M810 Age-related osteoporosis without current pathological fracture: Secondary | ICD-10-CM | POA: Diagnosis not present

## 2023-07-06 DIAGNOSIS — E43 Unspecified severe protein-calorie malnutrition: Secondary | ICD-10-CM | POA: Diagnosis not present

## 2023-07-06 DIAGNOSIS — M51369 Other intervertebral disc degeneration, lumbar region without mention of lumbar back pain or lower extremity pain: Secondary | ICD-10-CM | POA: Diagnosis not present

## 2023-07-06 DIAGNOSIS — I10 Essential (primary) hypertension: Secondary | ICD-10-CM | POA: Diagnosis not present

## 2023-07-06 DIAGNOSIS — F0394 Unspecified dementia, unspecified severity, with anxiety: Secondary | ICD-10-CM | POA: Diagnosis not present

## 2023-07-06 DIAGNOSIS — F419 Anxiety disorder, unspecified: Secondary | ICD-10-CM | POA: Diagnosis not present

## 2023-07-10 ENCOUNTER — Emergency Department (HOSPITAL_COMMUNITY)

## 2023-07-10 ENCOUNTER — Emergency Department (HOSPITAL_COMMUNITY)
Admission: EM | Admit: 2023-07-10 | Discharge: 2023-07-10 | Disposition: A | Discharge status: Home / Self Care | Attending: Emergency Medicine | Admitting: Emergency Medicine

## 2023-07-10 ENCOUNTER — Encounter (HOSPITAL_COMMUNITY): Payer: Self-pay | Admitting: Emergency Medicine

## 2023-07-10 DIAGNOSIS — K429 Umbilical hernia without obstruction or gangrene: Secondary | ICD-10-CM | POA: Diagnosis not present

## 2023-07-10 DIAGNOSIS — Y92129 Unspecified place in nursing home as the place of occurrence of the external cause: Secondary | ICD-10-CM | POA: Diagnosis not present

## 2023-07-10 DIAGNOSIS — D259 Leiomyoma of uterus, unspecified: Secondary | ICD-10-CM | POA: Diagnosis not present

## 2023-07-10 DIAGNOSIS — I6782 Cerebral ischemia: Secondary | ICD-10-CM | POA: Diagnosis not present

## 2023-07-10 DIAGNOSIS — S0990XA Unspecified injury of head, initial encounter: Secondary | ICD-10-CM | POA: Insufficient documentation

## 2023-07-10 DIAGNOSIS — K5730 Diverticulosis of large intestine without perforation or abscess without bleeding: Secondary | ICD-10-CM | POA: Diagnosis not present

## 2023-07-10 DIAGNOSIS — Z743 Need for continuous supervision: Secondary | ICD-10-CM | POA: Diagnosis not present

## 2023-07-10 DIAGNOSIS — W19XXXA Unspecified fall, initial encounter: Secondary | ICD-10-CM | POA: Diagnosis not present

## 2023-07-10 DIAGNOSIS — R1031 Right lower quadrant pain: Secondary | ICD-10-CM | POA: Diagnosis not present

## 2023-07-10 DIAGNOSIS — R600 Localized edema: Secondary | ICD-10-CM | POA: Diagnosis not present

## 2023-07-10 DIAGNOSIS — Z853 Personal history of malignant neoplasm of breast: Secondary | ICD-10-CM | POA: Diagnosis not present

## 2023-07-10 DIAGNOSIS — K449 Diaphragmatic hernia without obstruction or gangrene: Secondary | ICD-10-CM | POA: Diagnosis not present

## 2023-07-10 DIAGNOSIS — M542 Cervicalgia: Secondary | ICD-10-CM | POA: Diagnosis not present

## 2023-07-10 DIAGNOSIS — G319 Degenerative disease of nervous system, unspecified: Secondary | ICD-10-CM | POA: Diagnosis not present

## 2023-07-10 DIAGNOSIS — R519 Headache, unspecified: Secondary | ICD-10-CM | POA: Diagnosis not present

## 2023-07-10 DIAGNOSIS — S199XXA Unspecified injury of neck, initial encounter: Secondary | ICD-10-CM | POA: Diagnosis not present

## 2023-07-10 DIAGNOSIS — R531 Weakness: Secondary | ICD-10-CM | POA: Diagnosis not present

## 2023-07-10 LAB — URINALYSIS, W/ REFLEX TO CULTURE (INFECTION SUSPECTED)
Bacteria, UA: NONE SEEN
Bilirubin Urine: NEGATIVE
Glucose, UA: NEGATIVE mg/dL
Hgb urine dipstick: NEGATIVE
Ketones, ur: NEGATIVE mg/dL
Leukocytes,Ua: NEGATIVE
Nitrite: NEGATIVE
Protein, ur: NEGATIVE mg/dL
Specific Gravity, Urine: 1.016 (ref 1.005–1.030)
pH: 7 (ref 5.0–8.0)

## 2023-07-10 LAB — COMPREHENSIVE METABOLIC PANEL WITH GFR
ALT: 10 U/L (ref 0–44)
AST: 16 U/L (ref 15–41)
Albumin: 3 g/dL — ABNORMAL LOW (ref 3.5–5.0)
Alkaline Phosphatase: 60 U/L (ref 38–126)
Anion gap: 9 (ref 5–15)
BUN: 13 mg/dL (ref 8–23)
CO2: 26 mmol/L (ref 22–32)
Calcium: 8.5 mg/dL — ABNORMAL LOW (ref 8.9–10.3)
Chloride: 106 mmol/L (ref 98–111)
Creatinine, Ser: 0.65 mg/dL (ref 0.44–1.00)
GFR, Estimated: >60 mL/min
Glucose, Bld: 91 mg/dL (ref 70–99)
Potassium: 3.3 mmol/L — ABNORMAL LOW (ref 3.5–5.1)
Sodium: 141 mmol/L (ref 135–145)
Total Bilirubin: 0.6 mg/dL (ref 0.0–1.2)
Total Protein: 5.3 g/dL — ABNORMAL LOW (ref 6.5–8.1)

## 2023-07-10 LAB — CBC WITH DIFFERENTIAL/PLATELET
Abs Immature Granulocytes: 0.03 K/uL (ref 0.00–0.07)
Basophils Absolute: 0 K/uL (ref 0.0–0.1)
Basophils Relative: 0 %
Eosinophils Absolute: 0.2 K/uL (ref 0.0–0.5)
Eosinophils Relative: 3 %
HCT: 39 % (ref 36.0–46.0)
Hemoglobin: 12.5 g/dL (ref 12.0–15.0)
Immature Granulocytes: 0 %
Lymphocytes Relative: 19 %
Lymphs Abs: 1.3 K/uL (ref 0.7–4.0)
MCH: 29 pg (ref 26.0–34.0)
MCHC: 32.1 g/dL (ref 30.0–36.0)
MCV: 90.5 fL (ref 80.0–100.0)
Monocytes Absolute: 0.6 K/uL (ref 0.1–1.0)
Monocytes Relative: 8 %
Neutro Abs: 4.9 K/uL (ref 1.7–7.7)
Neutrophils Relative %: 70 %
Platelets: 174 K/uL (ref 150–400)
RBC: 4.31 MIL/uL (ref 3.87–5.11)
RDW: 13.3 % (ref 11.5–15.5)
WBC: 7 K/uL (ref 4.0–10.5)
nRBC: 0 % (ref 0.0–0.2)

## 2023-07-10 MED ORDER — IOHEXOL 300 MG/ML  SOLN
100.0000 mL | Freq: Once | INTRAMUSCULAR | Status: AC | PRN
  Administered 2023-07-10: 80 mL via INTRAVENOUS

## 2023-07-10 MED ORDER — LIDOCAINE 5 % EX PTCH
1.0000 | MEDICATED_PATCH | Freq: Once | CUTANEOUS | Status: DC
  Administered 2023-07-10: 1 via TRANSDERMAL
  Filled 2023-07-10: qty 1

## 2023-07-10 MED ORDER — DICYCLOMINE HCL 10 MG/ML IM SOLN
20.0000 mg | Freq: Once | INTRAMUSCULAR | Status: AC
Start: 2023-07-10 — End: 2023-07-10
  Administered 2023-07-10: 20 mg via INTRAMUSCULAR
  Filled 2023-07-10: qty 2

## 2023-07-10 NOTE — ED Triage Notes (Signed)
 Pt arrives via EMS from [place] at [facility] after unwitnessed fall. Pt states she had head pain that caused her to get up this am but is unsure of what caused her fall or any LOC. Pt is not on blood thinners. C-collar in place per EMS.

## 2023-07-10 NOTE — ED Notes (Signed)
 [facility] called

## 2023-07-10 NOTE — ED Provider Notes (Signed)
  EMERGENCY DEPARTMENT AT [facility] Provider Note   CSN: [id number] Arrival date & time: 07/10/23  2130     History  Chief Complaint  Patient presents with   Nancy Bell    RICKIE GUTIERRES is a 89 y.o. female with a history of breast cancer, malnutrition, and anxiety who presents the ED today after a fall.  Patient's daughter is at bedside and reports that she was called by the facility in which patient lives feeding that she had an unwitnessed fall.  She denies any vision changes, new or worsening weakness, or headaches at this time.  She states that she slid out of her wheelchair and hit her head.  Denies loss of consciousness.  She does not take any blood thinners.  Endorses pain to the neck denies any back pain or pain to the extremities.  Additionally, she reports right lower quadrant pain has been going on for a while.  Denies any changes to bowel habits, dysuria, nausea, vomiting, or fevers. No additional complaints or concerns at this time.    Home Medications Prior to Admission medications   Medication Sig Start Date End Date Taking? Authorizing Provider  ALPRAZolam (XANAX) 0.25 MG tablet Take 0.125 mg by mouth 2 (two) times daily. 01/23/15   [provider]  Coenzyme Q10 (CO Q 10 PO) Take 1 capsule by mouth daily.    [provider]  loperamide (IMODIUM A-D) 2 MG tablet Take 1 tablet (2 mg total) by mouth 3 (three) times daily as needed for diarrhea or loose stools. 10/02/22   Pokhrel, Rebekah Chesterfield, MD  lovastatin (MEVACOR) 40 MG tablet Take 40 mg by mouth daily. 01/31/15   [provider]  naproxen sodium (ALEVE) 220 MG tablet Take 220 mg by mouth daily as needed (pain).    [provider]  omeprazole (PRILOSEC) 20 MG capsule Take 20 mg by mouth daily.    [provider]  Potassium Chloride ER 20 MEQ TBCR Take 20 mEq by mouth 2 (two) times daily. 03/10/23   [provider]      Allergies    Augmentin  [amoxicillin-pot clavulanate], Vicodin [hydrocodone-acetaminophen], Oxycontin [oxycodone hcl], Tylenol [acetaminophen], and Ultram [tramadol hcl]    Review of Systems   Review of Systems  Constitutional:        Fall  All other systems reviewed and are negative.   Physical Exam Updated Vital Signs BP (!) 114/59 (BP Location: Left Arm)   Pulse 75   Temp (!) 97.5 F (36.4 C) (Oral)   Resp 16   Ht 5\' 2"  (1.575 m)   Wt 56 kg   SpO2 98%   BMI 22.58 kg/m  Physical Exam Vitals and nursing note reviewed.  Constitutional:      General: She is not in acute distress.    Appearance: Normal appearance.  HENT:     Head: Normocephalic and atraumatic.     Mouth/Throat:     Mouth: Mucous membranes are moist.  Eyes:     Conjunctiva/sclera: Conjunctivae normal.     Pupils: Pupils are equal, round, and reactive to light.  Neck:     Comments: Patient in c-collar on initial evaluation. Left-sided paravertebral tenderness to palpation. Full ROM of neck appreciated when c-collar cleared Cardiovascular:     Rate and Rhythm: Normal rate and regular rhythm.     Pulses: Normal pulses.     Heart sounds: Normal heart sounds.  Pulmonary:     Effort: Pulmonary effort is normal.  Breath sounds: Normal breath sounds.  Abdominal:     Palpations: Abdomen is soft.     Tenderness: There is no abdominal tenderness.  Musculoskeletal:        General: Normal range of motion.     Cervical back: Normal range of motion. Tenderness present.     Right lower leg: Edema present.     Left lower leg: Edema present.     Comments: Bilateral lower extremity edema, which is chronic  Skin:    General: Skin is warm and dry.     Findings: No rash.  Neurological:     General: No focal deficit present.     Mental Status: She is alert. Mental status is at baseline.     Cranial Nerves: No cranial nerve deficit.     Sensory: No sensory deficit.     Motor: No weakness.  Psychiatric:        Mood and Affect: Mood  normal.        Behavior: Behavior normal.     ED Results / Procedures / Treatments   Labs (all labs ordered are listed, but only abnormal results are displayed) Labs Reviewed  COMPREHENSIVE METABOLIC PANEL - Abnormal; Notable for the following components:      Result Value   Potassium 3.3 (*)    Calcium 8.5 (*)    Total Protein 5.3 (*)    Albumin 3.0 (*)    All other components within normal limits  URINALYSIS, W/ REFLEX TO CULTURE (INFECTION SUSPECTED) - Abnormal; Notable for the following components:   Color, Urine STRAW (*)    All other components within normal limits  CBC WITH DIFFERENTIAL/PLATELET    EKG None  Radiology CT ABDOMEN PELVIS W CONTRAST Result Date: 07/10/2023 CLINICAL DATA:  Right lower quadrant abdominal pain. EXAM: CT ABDOMEN AND PELVIS WITH CONTRAST TECHNIQUE: Multidetector CT imaging of the abdomen and pelvis was performed using the standard protocol following bolus administration of intravenous contrast. RADIATION DOSE REDUCTION: This exam was performed according to the departmental dose-optimization program which includes automated exposure control, adjustment of the mA and/or kV according to patient size and/or use of iterative reconstruction technique. CONTRAST:  80mL OMNIPAQUE IOHEXOL 300 MG/ML  SOLN COMPARISON:  05/08/2023 FINDINGS: Lower chest: Trace pleural effusions identified, left greater than right. No consolidative change. Moderate to large hiatal hernia. Hepatobiliary: No suspicious liver abnormality. Right dome of liver cyst measures 1.2 cm. Gallbladder appears normal. No bile duct dilatation. Pancreas: Unremarkable. No pancreatic ductal dilatation or surrounding inflammatory changes. Spleen: Normal in size without focal abnormality. Adrenals/Urinary Tract: Normal adrenal glands. No nephrolithiasis, hydronephrosis or suspicious mass. Too small to characterize exophytic cyst off the upper pole of left kidney measures 6 mm compatible with a Bosniak class 2  cyst. No follow-up imaging recommended. Bladder appears normal. Stomach/Bowel: Hiatal hernia. Stomach is nondistended. The appendix is not confidently identified separate from the right lower quadrant bowel loops. No secondary signs of acute appendicitis. No pathologic dilatation of the large or small bowel loops. Distal colonic diverticulosis. No signs of acute diverticulitis. Midline umbilical hernia is identified containing a knuckle a of nonobstructed loop of small bowel, image 46/5. Vascular/Lymphatic: Aortic atherosclerosis without aneurysm. Patent upper abdominal vascularity. No abdominopelvic adenopathy. Reproductive: Fibroid uterus. There are 2 densely calcified fibroids identified. The largest measures 4.4 cm. No adnexal mass identified. Other: No ascites no discrete fluid collections. No signs of pneumoperitoneum. Musculoskeletal: No acute or significant osseous findings. IMPRESSION: 1. No acute findings within the abdomen or pelvis.  2. The appendix is not confidently identified separate from the right lower quadrant bowel loops. No secondary signs of acute appendicitis. 3. Distal colonic diverticulosis without signs of acute diverticulitis. 4. Midline umbilical hernia containing a knuckle of nonobstructed loop of small bowel. 5. Moderate to large hiatal hernia. 6. Trace pleural effusions, left greater than right. 7. Fibroid uterus. 8.  Aortic Atherosclerosis (ICD10-I70.0). Electronically Signed   By: Signa Kell M.D.   On: 07/10/2023 10:53   CT Head Wo Contrast Result Date: 07/10/2023 CLINICAL DATA:  Minor head trauma.  Unwitnessed fall EXAM: CT HEAD WITHOUT CONTRAST CT CERVICAL SPINE WITHOUT CONTRAST TECHNIQUE: Multidetector CT imaging of the head and cervical spine was performed following the standard protocol without intravenous contrast. Multiplanar CT image reconstructions of the cervical spine were also generated. RADIATION DOSE REDUCTION: This exam was performed according to the departmental  dose-optimization program which includes automated exposure control, adjustment of the mA and/or kV according to patient size and/or use of iterative reconstruction technique. COMPARISON:  04/12/2023 brain MRI FINDINGS: CT HEAD FINDINGS Brain: No evidence of acute infarction, hemorrhage, hydrocephalus, extra-axial collection or mass lesion/mass effect. Generalized atrophy with mild for age chronic small vessel ischemia. Vascular: No hyperdense vessel or unexpected calcification. Skull: Normal. Negative for fracture or focal lesion. Sinuses/Orbits: No acute finding. CT CERVICAL SPINE FINDINGS Alignment: No traumatic malalignment. C1-2 alignment attributed to head positioning. Skull base and vertebrae: No acute fracture. No primary bone lesion or focal pathologic process. C7-T1 incomplete segmentation. Soft tissues and spinal canal: No prevertebral fluid or swelling. No visible canal hematoma. Subcutaneous dermal inclusion cyst along the left upper back. Disc levels: Generalized degenerative endplate and facet spurring. Notable foraminal narrowing on the left at C4-5. Upper chest: No acute finding IMPRESSION: No evidence of acute intracranial or cervical spine injury. Electronically Signed   By: Tiburcio Pea M.D.   On: 07/10/2023 10:34   CT Cervical Spine Wo Contrast Result Date: 07/10/2023 CLINICAL DATA:  Minor head trauma.  Unwitnessed fall EXAM: CT HEAD WITHOUT CONTRAST CT CERVICAL SPINE WITHOUT CONTRAST TECHNIQUE: Multidetector CT imaging of the head and cervical spine was performed following the standard protocol without intravenous contrast. Multiplanar CT image reconstructions of the cervical spine were also generated. RADIATION DOSE REDUCTION: This exam was performed according to the departmental dose-optimization program which includes automated exposure control, adjustment of the mA and/or kV according to patient size and/or use of iterative reconstruction technique. COMPARISON:  04/12/2023 brain MRI  FINDINGS: CT HEAD FINDINGS Brain: No evidence of acute infarction, hemorrhage, hydrocephalus, extra-axial collection or mass lesion/mass effect. Generalized atrophy with mild for age chronic small vessel ischemia. Vascular: No hyperdense vessel or unexpected calcification. Skull: Normal. Negative for fracture or focal lesion. Sinuses/Orbits: No acute finding. CT CERVICAL SPINE FINDINGS Alignment: No traumatic malalignment. C1-2 alignment attributed to head positioning. Skull base and vertebrae: No acute fracture. No primary bone lesion or focal pathologic process. C7-T1 incomplete segmentation. Soft tissues and spinal canal: No prevertebral fluid or swelling. No visible canal hematoma. Subcutaneous dermal inclusion cyst along the left upper back. Disc levels: Generalized degenerative endplate and facet spurring. Notable foraminal narrowing on the left at C4-5. Upper chest: No acute finding IMPRESSION: No evidence of acute intracranial or cervical spine injury. Electronically Signed   By: Tiburcio Pea M.D.   On: 07/10/2023 10:34    Procedures Procedures: not indicated.   Medications Ordered in ED Medications  lidocaine (LIDODERM) 5 % 1 patch (has no administration in time range)  iohexol (OMNIPAQUE)  300 MG/ML solution 100 mL (80 mLs Intravenous Contrast Given 07/10/23 0950)    ED Course/ Medical Decision Making/ A&P                                 Medical Decision Making Amount and/or Complexity of Data Reviewed Labs: ordered. Radiology: ordered.  Risk Prescription drug management.   This patient presents to the ED for concern of unwitnessed fall, this involves an extensive number of treatment options, and is a complaint that carries with it a high risk of complications and morbidity.   Differential diagnosis includes: SAH, SDH, EDH, fracture, concussion, contusion, bowel obstruction, perforation, appendicitis, constipation, etc.   Comorbidities  See HPI above   Additional  History  Additional history obtained from prior records   Lab Tests  I ordered and personally interpreted labs.  The pertinent results include:   UA is unremarkable CMP and CBC within normal limits - no acute electrolyte derangement, AKI, infection, or anemia   Imaging Studies  I ordered imaging studies including CT head, cervical spine, and abdomen/pelvis  I independently visualized and interpreted imaging which showed:  No evidence of acute intracranial or cervical spine injury CT abdomen pelvis showed no acute findings.  Umbilical hernia and hiatal hernia appreciated as well as uterine fibroids.   Patient and daughter at bedside already aware of these incidental findings. I agree with the radiologist interpretation   Problem List / ED Course / Critical Interventions / Medication Management  Unwitnessed fall at nursing facility earlier this morning.  Patient was complaining of head pain after fall and was brought to the ED for further evaluation.  She voices pain to the back the neck but denies any other areas of pain at this time.  Did not lose consciousness and is not on any blood thinners. Discussed findings with patient and daughter at bedside.  They were already aware of the incidental findings noted on the abdomen/pelvis imaging. C-collar cleared.  Patient has some left-sided neck tenderness, which he thinks could be due to wearing the collar. I ordered medications including: Lidocaine patch for left-sided neck tenderness I have reviewed the patients home medicines and have made adjustments as needed Advised close primary care follow-up for reevaluation   Social Determinants of Health  Physical activity   Test / Admission - Considered  Patient is stable and safe for discharge home. Return precautions given.        Final Clinical Impression(s) / ED Diagnoses Final diagnoses:  Fall, initial encounter    Rx / DC Orders ED Discharge Orders     None          Maxwell Marion, PA-C 07/10/23 1144    Benjiman Core, MD 07/10/23 1510

## 2023-07-10 NOTE — Discharge Instructions (Addendum)
 Your labs and imaging are reassuring.  You can get lidocaine patches over-the-counter and use them as needed if you are still having some left-sided neck tenderness over the next several days.  Follow up with your primary care provider in the next several days for reevaluation.  Get help right away if you: Lose consciousness or have trouble moving after a fall. Have a fall that causes a head injury.

## 2023-07-10 NOTE — ED Notes (Signed)
 Patient has a urine culture in the main lab

## 2023-07-13 DIAGNOSIS — E46 Unspecified protein-calorie malnutrition: Secondary | ICD-10-CM | POA: Diagnosis not present

## 2023-07-13 DIAGNOSIS — I10 Essential (primary) hypertension: Secondary | ICD-10-CM | POA: Diagnosis not present

## 2023-07-13 DIAGNOSIS — E876 Hypokalemia: Secondary | ICD-10-CM | POA: Diagnosis not present

## 2023-07-13 DIAGNOSIS — F03B0 Unspecified dementia, moderate, without behavioral disturbance, psychotic disturbance, mood disturbance, and anxiety: Secondary | ICD-10-CM | POA: Diagnosis not present

## 2023-07-13 DIAGNOSIS — M810 Age-related osteoporosis without current pathological fracture: Secondary | ICD-10-CM | POA: Diagnosis not present

## 2023-07-13 DIAGNOSIS — S098XXA Other specified injuries of head, initial encounter: Secondary | ICD-10-CM | POA: Diagnosis not present

## 2023-07-13 DIAGNOSIS — F419 Anxiety disorder, unspecified: Secondary | ICD-10-CM | POA: Diagnosis not present

## 2023-07-13 DIAGNOSIS — E43 Unspecified severe protein-calorie malnutrition: Secondary | ICD-10-CM | POA: Diagnosis not present

## 2023-07-13 DIAGNOSIS — M51369 Other intervertebral disc degeneration, lumbar region without mention of lumbar back pain or lower extremity pain: Secondary | ICD-10-CM | POA: Diagnosis not present

## 2023-07-13 DIAGNOSIS — L03119 Cellulitis of unspecified part of limb: Secondary | ICD-10-CM | POA: Diagnosis not present

## 2023-07-13 DIAGNOSIS — R600 Localized edema: Secondary | ICD-10-CM | POA: Diagnosis not present

## 2023-07-13 DIAGNOSIS — F0394 Unspecified dementia, unspecified severity, with anxiety: Secondary | ICD-10-CM | POA: Diagnosis not present

## 2023-07-13 DIAGNOSIS — W19XXXA Unspecified fall, initial encounter: Secondary | ICD-10-CM | POA: Diagnosis not present

## 2023-07-14 DIAGNOSIS — F419 Anxiety disorder, unspecified: Secondary | ICD-10-CM | POA: Diagnosis not present

## 2023-07-14 DIAGNOSIS — F0394 Unspecified dementia, unspecified severity, with anxiety: Secondary | ICD-10-CM | POA: Diagnosis not present

## 2023-07-14 DIAGNOSIS — I10 Essential (primary) hypertension: Secondary | ICD-10-CM | POA: Diagnosis not present

## 2023-07-14 DIAGNOSIS — E43 Unspecified severe protein-calorie malnutrition: Secondary | ICD-10-CM | POA: Diagnosis not present

## 2023-07-14 DIAGNOSIS — M810 Age-related osteoporosis without current pathological fracture: Secondary | ICD-10-CM | POA: Diagnosis not present

## 2023-07-14 DIAGNOSIS — M51369 Other intervertebral disc degeneration, lumbar region without mention of lumbar back pain or lower extremity pain: Secondary | ICD-10-CM | POA: Diagnosis not present

## 2023-07-18 ENCOUNTER — Encounter (HOSPITAL_COMMUNITY): Payer: Self-pay

## 2023-07-18 ENCOUNTER — Emergency Department (HOSPITAL_COMMUNITY)
Admission: EM | Admit: 2023-07-18 | Discharge: 2023-07-19 | Disposition: A | Discharge status: Home / Self Care | Attending: Emergency Medicine | Admitting: Emergency Medicine

## 2023-07-18 ENCOUNTER — Emergency Department (HOSPITAL_COMMUNITY)

## 2023-07-18 ENCOUNTER — Other Ambulatory Visit: Payer: Self-pay

## 2023-07-18 DIAGNOSIS — K449 Diaphragmatic hernia without obstruction or gangrene: Secondary | ICD-10-CM | POA: Diagnosis not present

## 2023-07-18 DIAGNOSIS — I10 Essential (primary) hypertension: Secondary | ICD-10-CM | POA: Diagnosis not present

## 2023-07-18 DIAGNOSIS — R9082 White matter disease, unspecified: Secondary | ICD-10-CM | POA: Diagnosis not present

## 2023-07-18 DIAGNOSIS — S0993XA Unspecified injury of face, initial encounter: Secondary | ICD-10-CM | POA: Diagnosis not present

## 2023-07-18 DIAGNOSIS — R112 Nausea with vomiting, unspecified: Secondary | ICD-10-CM | POA: Diagnosis not present

## 2023-07-18 DIAGNOSIS — S0990XA Unspecified injury of head, initial encounter: Secondary | ICD-10-CM | POA: Diagnosis not present

## 2023-07-18 DIAGNOSIS — R9389 Abnormal findings on diagnostic imaging of other specified body structures: Secondary | ICD-10-CM | POA: Diagnosis not present

## 2023-07-18 DIAGNOSIS — M791 Myalgia, unspecified site: Secondary | ICD-10-CM | POA: Diagnosis present

## 2023-07-18 DIAGNOSIS — S199XXA Unspecified injury of neck, initial encounter: Secondary | ICD-10-CM | POA: Diagnosis not present

## 2023-07-18 DIAGNOSIS — R197 Diarrhea, unspecified: Secondary | ICD-10-CM | POA: Diagnosis not present

## 2023-07-18 DIAGNOSIS — R102 Pelvic and perineal pain: Secondary | ICD-10-CM | POA: Diagnosis not present

## 2023-07-18 DIAGNOSIS — D259 Leiomyoma of uterus, unspecified: Secondary | ICD-10-CM | POA: Diagnosis not present

## 2023-07-18 DIAGNOSIS — W19XXXA Unspecified fall, initial encounter: Secondary | ICD-10-CM | POA: Diagnosis not present

## 2023-07-18 DIAGNOSIS — R58 Hemorrhage, not elsewhere classified: Secondary | ICD-10-CM | POA: Diagnosis not present

## 2023-07-18 DIAGNOSIS — S01412A Laceration without foreign body of left cheek and temporomandibular area, initial encounter: Secondary | ICD-10-CM | POA: Insufficient documentation

## 2023-07-18 DIAGNOSIS — R079 Chest pain, unspecified: Secondary | ICD-10-CM | POA: Diagnosis not present

## 2023-07-18 DIAGNOSIS — W07XXXA Fall from chair, initial encounter: Secondary | ICD-10-CM | POA: Insufficient documentation

## 2023-07-18 DIAGNOSIS — F0390 Unspecified dementia without behavioral disturbance: Secondary | ICD-10-CM | POA: Insufficient documentation

## 2023-07-18 LAB — CBC WITH DIFFERENTIAL/PLATELET
Abs Immature Granulocytes: 0.04 10*3/uL (ref 0.00–0.07)
Basophils Absolute: 0 10*3/uL (ref 0.0–0.1)
Basophils Relative: 0 %
Eosinophils Absolute: 0.1 10*3/uL (ref 0.0–0.5)
Eosinophils Relative: 1 %
HCT: 43.3 % (ref 36.0–46.0)
Hemoglobin: 14 g/dL (ref 12.0–15.0)
Immature Granulocytes: 0 %
Lymphocytes Relative: 12 %
Lymphs Abs: 1.3 10*3/uL (ref 0.7–4.0)
MCH: 29.5 pg (ref 26.0–34.0)
MCHC: 32.3 g/dL (ref 30.0–36.0)
MCV: 91.2 fL (ref 80.0–100.0)
Monocytes Absolute: 0.9 10*3/uL (ref 0.1–1.0)
Monocytes Relative: 8 %
Neutro Abs: 8.5 10*3/uL — ABNORMAL HIGH (ref 1.7–7.7)
Neutrophils Relative %: 79 %
Platelets: 229 10*3/uL (ref 150–400)
RBC: 4.75 MIL/uL (ref 3.87–5.11)
RDW: 13.3 % (ref 11.5–15.5)
WBC: 10.8 10*3/uL — ABNORMAL HIGH (ref 4.0–10.5)
nRBC: 0 % (ref 0.0–0.2)

## 2023-07-18 LAB — COMPREHENSIVE METABOLIC PANEL
ALT: 11 U/L (ref 0–44)
AST: 19 U/L (ref 15–41)
Albumin: 3.3 g/dL — ABNORMAL LOW (ref 3.5–5.0)
Alkaline Phosphatase: 76 U/L (ref 38–126)
Anion gap: 12 (ref 5–15)
BUN: 24 mg/dL — ABNORMAL HIGH (ref 8–23)
CO2: 27 mmol/L (ref 22–32)
Calcium: 8.5 mg/dL — ABNORMAL LOW (ref 8.9–10.3)
Chloride: 102 mmol/L (ref 98–111)
Creatinine, Ser: 0.89 mg/dL (ref 0.44–1.00)
GFR, Estimated: 59 mL/min — ABNORMAL LOW (ref >60)
Glucose, Bld: 115 mg/dL — ABNORMAL HIGH (ref 70–99)
Potassium: 3.5 mmol/L (ref 3.5–5.1)
Sodium: 141 mmol/L (ref 135–145)
Total Bilirubin: 1.3 mg/dL — ABNORMAL HIGH (ref 0.0–1.2)
Total Protein: 6.6 g/dL (ref 6.5–8.1)

## 2023-07-18 MED ORDER — KETOROLAC TROMETHAMINE 15 MG/ML IJ SOLN
15.0000 mg | Freq: Once | INTRAMUSCULAR | Status: AC
Start: 2023-07-18 — End: 2023-07-18
  Administered 2023-07-18: 15 mg via INTRAMUSCULAR
  Filled 2023-07-18: qty 1

## 2023-07-18 MED ORDER — ONDANSETRON 4 MG PO TBDP
4.0000 mg | ORAL_TABLET | Freq: Once | ORAL | Status: AC
  Administered 2023-07-18: 4 mg via ORAL
  Filled 2023-07-18: qty 1

## 2023-07-18 NOTE — ED Notes (Signed)
 [facility] called and transport arranged. [facility] advised there would be a wait due to long list of scheduled transports.

## 2023-07-18 NOTE — ED Notes (Signed)
 Pt bilateral lower legs wrapped with dry dressings per family request. Dry dressings verified with MD.

## 2023-07-18 NOTE — ED Notes (Signed)
 Patient transported to CT and returned to room.

## 2023-07-18 NOTE — Discharge Instructions (Signed)
Follow up with your family doc in the office.  

## 2023-07-18 NOTE — ED Provider Notes (Signed)
 [place] EMERGENCY DEPARTMENT AT [facility] Provider Note   CSN: [id number] Arrival date & time: 07/18/23  1807     History  Chief Complaint  Patient presents with   Nancy Bell    Nancy Bell is a 89 y.o. female.  36 yoF with a chief complaint of a fall.  Reportedly she had lost her balance and fell.  The patient thinks that she slid out of a chair.  She is complaining of pain all over her body.  She has trouble localizing it anywhere.  EMS reports that there is an outbreak of norovirus and the patient had been throwing up and having diarrhea earlier today.   Fall       Home Medications Prior to Admission medications   Medication Sig Start Date End Date Taking? Authorizing Provider  ALPRAZolam (XANAX) 0.25 MG tablet Take 0.125 mg by mouth 2 (two) times daily. 01/23/15   [provider]  Coenzyme Q10 (CO Q 10 PO) Take 1 capsule by mouth daily.    [provider]  loperamide (IMODIUM A-D) 2 MG tablet Take 1 tablet (2 mg total) by mouth 3 (three) times daily as needed for diarrhea or loose stools. 10/02/22   Pokhrel, Rebekah Chesterfield, MD  lovastatin (MEVACOR) 40 MG tablet Take 40 mg by mouth daily. 01/31/15   [provider]  naproxen sodium (ALEVE) 220 MG tablet Take 220 mg by mouth daily as needed (pain).    [provider]  omeprazole (PRILOSEC) 20 MG capsule Take 20 mg by mouth daily.    [provider]  Potassium Chloride ER 20 MEQ TBCR Take 20 mEq by mouth 2 (two) times daily. 03/10/23   [provider]      Allergies    Augmentin [amoxicillin-pot clavulanate], Vicodin [hydrocodone-acetaminophen], Oxycontin [oxycodone hcl], Tylenol [acetaminophen], and Ultram [tramadol hcl]    Review of Systems   Review of Systems  Physical Exam Updated Vital Signs BP 117/65   Pulse 70   Temp 98.7 F (37.1 C) (Oral)   Resp 20   Ht 5\' 2"  (1.575 m)   Wt 56 kg   SpO2 100%   BMI 22.58 kg/m  Physical Exam Vitals and nursing  note reviewed.  Constitutional:      General: She is not in acute distress.    Appearance: She is well-developed. She is not diaphoretic.  HENT:     Head: Normocephalic.     Comments: Small skin tear to the left cheek. Eyes:     Pupils: Pupils are equal, round, and reactive to light.  Cardiovascular:     Rate and Rhythm: Normal rate and regular rhythm.     Heart sounds: No murmur heard.    No friction rub. No gallop.  Pulmonary:     Effort: Pulmonary effort is normal.     Breath sounds: No wheezing or rales.  Abdominal:     General: There is no distension.     Palpations: Abdomen is soft.     Tenderness: There is no abdominal tenderness.  Musculoskeletal:        General: No tenderness.     Cervical back: Normal range of motion and neck supple.     Comments: Mild tenderness diffusely about the entire body.  Hard to appreciate any obvious discomfort with range of motion.  Perhaps is a bit stiff with the left hip.  Skin:    General: Skin is warm and dry.  Neurological:     Mental Status: She  is alert and oriented to person, place, and time.  Psychiatric:        Behavior: Behavior normal.     ED Results / Procedures / Treatments   Labs (all labs ordered are listed, but only abnormal results are displayed) Labs Reviewed  CBC WITH DIFFERENTIAL/PLATELET - Abnormal; Notable for the following components:      Result Value   WBC 10.8 (*)    Neutro Abs 8.5 (*)    All other components within normal limits  COMPREHENSIVE METABOLIC PANEL - Abnormal; Notable for the following components:   Glucose, Bld 115 (*)    BUN 24 (*)    Calcium 8.5 (*)    Albumin 3.3 (*)    Total Bilirubin 1.3 (*)    GFR, Estimated 59 (*)    All other components within normal limits    EKG EKG Interpretation Date/Time:  Sunday July 18 2023 18:41:00 EDT Ventricular Rate:  81 PR Interval:  161 QRS Duration:  109 QT Interval:  377 QTC Calculation: 435 R Axis:   -11  Text Interpretation: Sinus  rhythm Atrial premature complex RSR' in V1 or V2, probably normal variant Baseline wander TECHNICALLY DIFFICULT No significant change since last tracing Confirmed by Melene Plan [phone]) on 07/18/2023 7:31:45 PM  Radiology DG Pelvis Portable Result Date: 07/18/2023 CLINICAL DATA:  Pain after fall. EXAM: PORTABLE PELVIS 1-2 VIEWS COMPARISON:  Reformats from abdominopelvic CT 07/10/2023 FINDINGS: The cortical margins of the bony pelvis are intact. No fracture. Pubic symphysis and sacroiliac joints are congruent. Both femoral heads are well-seated in the respective acetabula. Calcified uterine fibroids in the central pelvis. IMPRESSION: 1. No pelvic fracture. 2. Calcified uterine fibroids. Electronically Signed   By: Narda Rutherford M.D.   On: 07/18/2023 20:22   DG Chest Port 1 View Result Date: 07/18/2023 CLINICAL DATA:  Chest pain after fall.  Fall from recliner chair. EXAM: PORTABLE CHEST 1 VIEW COMPARISON:  05/08/2023 FINDINGS: Elevated right hemidiaphragm. This is unchanged from prior.The cardiomediastinal contours are stable. Retrocardiac hiatal hernia. Pulmonary vasculature is normal. No consolidation, pleural effusion, or pneumothorax. No acute osseous abnormalities are seen. Left axillary surgical clips. IMPRESSION: 1. No acute chest findings. 2. Elevated right hemidiaphragm, unchanged. 3. Hiatal hernia. Electronically Signed   By: Narda Rutherford M.D.   On: 07/18/2023 20:21   CT Head Wo Contrast Result Date: 07/18/2023 CLINICAL DATA:  Trauma, fall from chair EXAM: CT HEAD WITHOUT CONTRAST CT MAXILLOFACIAL WITHOUT CONTRAST CT CERVICAL SPINE WITHOUT CONTRAST TECHNIQUE: Multidetector CT imaging of the head, cervical spine, and maxillofacial structures were performed using the standard protocol without intravenous contrast. Multiplanar CT image reconstructions of the cervical spine and maxillofacial structures were also generated. RADIATION DOSE REDUCTION: This exam was performed according to the  departmental dose-optimization program which includes automated exposure control, adjustment of the mA and/or kV according to patient size and/or use of iterative reconstruction technique. COMPARISON:  07/10/2023 FINDINGS: CT HEAD FINDINGS Brain: No evidence of acute infarction, hemorrhage, hydrocephalus, extra-axial collection or mass lesion/mass effect. Periventricular and deep white matter hypodensity. Vascular: No hyperdense vessel or unexpected calcification. CT FACIAL BONES FINDINGS Skull: Normal. Negative for fracture or focal lesion. Facial bones: No displaced fractures or dislocations. Sinuses/Orbits: No acute finding. Other: None. CT CERVICAL SPINE FINDINGS Alignment: Normal. Skull base and vertebrae: No acute fracture. No primary bone lesion or focal pathologic process. Soft tissues and spinal canal: No prevertebral fluid or swelling. No visible canal hematoma. Disc levels: Moderate to severe multilevel cervical disc degenerative disease,  worst at the lower cervical levels. Upper chest: Negative. Other: None. IMPRESSION: 1. No acute intracranial pathology. 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 2. No displaced fractures or dislocations of the facial bones. 3. No fracture or static subluxation of the cervical spine. 4. Moderate to severe multilevel cervical disc degenerative disease, worst at the lower cervical levels. Electronically Signed   By: Jearld Lesch M.D.   On: 07/18/2023 19:27   CT Maxillofacial Wo Contrast Result Date: 07/18/2023 CLINICAL DATA:  Trauma, fall from chair EXAM: CT HEAD WITHOUT CONTRAST CT MAXILLOFACIAL WITHOUT CONTRAST CT CERVICAL SPINE WITHOUT CONTRAST TECHNIQUE: Multidetector CT imaging of the head, cervical spine, and maxillofacial structures were performed using the standard protocol without intravenous contrast. Multiplanar CT image reconstructions of the cervical spine and maxillofacial structures were also generated. RADIATION DOSE  REDUCTION: This exam was performed according to the departmental dose-optimization program which includes automated exposure control, adjustment of the mA and/or kV according to patient size and/or use of iterative reconstruction technique. COMPARISON:  07/10/2023 FINDINGS: CT HEAD FINDINGS Brain: No evidence of acute infarction, hemorrhage, hydrocephalus, extra-axial collection or mass lesion/mass effect. Periventricular and deep white matter hypodensity. Vascular: No hyperdense vessel or unexpected calcification. CT FACIAL BONES FINDINGS Skull: Normal. Negative for fracture or focal lesion. Facial bones: No displaced fractures or dislocations. Sinuses/Orbits: No acute finding. Other: None. CT CERVICAL SPINE FINDINGS Alignment: Normal. Skull base and vertebrae: No acute fracture. No primary bone lesion or focal pathologic process. Soft tissues and spinal canal: No prevertebral fluid or swelling. No visible canal hematoma. Disc levels: Moderate to severe multilevel cervical disc degenerative disease, worst at the lower cervical levels. Upper chest: Negative. Other: None. IMPRESSION: 1. No acute intracranial pathology. 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 2. No displaced fractures or dislocations of the facial bones. 3. No fracture or static subluxation of the cervical spine. 4. Moderate to severe multilevel cervical disc degenerative disease, worst at the lower cervical levels. Electronically Signed   By: Jearld Lesch M.D.   On: 07/18/2023 19:27   CT Cervical Spine Wo Contrast Result Date: 07/18/2023 CLINICAL DATA:  Trauma, fall from chair EXAM: CT HEAD WITHOUT CONTRAST CT MAXILLOFACIAL WITHOUT CONTRAST CT CERVICAL SPINE WITHOUT CONTRAST TECHNIQUE: Multidetector CT imaging of the head, cervical spine, and maxillofacial structures were performed using the standard protocol without intravenous contrast. Multiplanar CT image reconstructions of the cervical spine and maxillofacial  structures were also generated. RADIATION DOSE REDUCTION: This exam was performed according to the departmental dose-optimization program which includes automated exposure control, adjustment of the mA and/or kV according to patient size and/or use of iterative reconstruction technique. COMPARISON:  07/10/2023 FINDINGS: CT HEAD FINDINGS Brain: No evidence of acute infarction, hemorrhage, hydrocephalus, extra-axial collection or mass lesion/mass effect. Periventricular and deep white matter hypodensity. Vascular: No hyperdense vessel or unexpected calcification. CT FACIAL BONES FINDINGS Skull: Normal. Negative for fracture or focal lesion. Facial bones: No displaced fractures or dislocations. Sinuses/Orbits: No acute finding. Other: None. CT CERVICAL SPINE FINDINGS Alignment: Normal. Skull base and vertebrae: No acute fracture. No primary bone lesion or focal pathologic process. Soft tissues and spinal canal: No prevertebral fluid or swelling. No visible canal hematoma. Disc levels: Moderate to severe multilevel cervical disc degenerative disease, worst at the lower cervical levels. Upper chest: Negative. Other: None. IMPRESSION: 1. No acute intracranial pathology. 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 2. No displaced fractures or dislocations of the facial bones. 3. No  fracture or static subluxation of the cervical spine. 4. Moderate to severe multilevel cervical disc degenerative disease, worst at the lower cervical levels. Electronically Signed   By: Jearld Lesch M.D.   On: 07/18/2023 19:27    Procedures Procedures    Medications Ordered in ED Medications  ondansetron (ZOFRAN-ODT) disintegrating tablet 4 mg (has no administration in time range)  ketorolac (TORADOL) 15 MG/ML injection 15 mg (15 mg Intramuscular Given 07/18/23 2019)    ED Course/ Medical Decision Making/ A&P                                 Medical Decision Making Amount and/or Complexity of Data  Reviewed Labs: ordered. Radiology: ordered. ECG/medicine tests: ordered.  Risk Prescription drug management.   89 yO F with a chief complaints of a fall.  Patient was reportedly trying to get out of a chair and fell to the ground.  Patient tells me that she just slid out but is complaining of pain all over her body.  She has trouble localizing this is pan positive on review of systems.  She has a history of dementia.  Reportedly from EMS and her daughter there has been an outbreak of norovirus and they think perhaps she has caught it as well.  Will obtain a CT of the head face and C-spine.  Plain film of the chest and the pelvis.  Patient was just here 8 days ago and had CT imaging of the abdomen and pelvis without obvious acute finding.  Patient with CT head face and C spine without acute finding.  Plain film of the chest without focal infiltrate or ptx on my independent interpretation.  Plain film of the pelvis without obvious fx on my independent interpretation.   8:58 PM:  I have discussed the diagnosis/risks/treatment options with the patient.  Evaluation and diagnostic testing in the emergency department does not suggest an emergent condition requiring admission or immediate intervention beyond what has been performed at this time.  They will follow up with PCP. We also discussed returning to the ED immediately if new or worsening sx occur. We discussed the sx which are most concerning (e.g., sudden worsening pain, fever, inability to tolerate by mouth) that necessitate immediate return. Medications administered to the patient during their visit and any new prescriptions provided to the patient are listed below.  Medications given during this visit Medications  ondansetron (ZOFRAN-ODT) disintegrating tablet 4 mg (has no administration in time range)  ketorolac (TORADOL) 15 MG/ML injection 15 mg (15 mg Intramuscular Given 07/18/23 2019)     The patient appears reasonably screen and/or  stabilized for discharge and I doubt any other medical condition or other [facility] requiring further screening, evaluation, or treatment in the ED at this time prior to discharge.          Final Clinical Impression(s) / ED Diagnoses Final diagnoses:  Nausea vomiting and diarrhea  Fall, initial encounter    Rx / DC Orders ED Discharge Orders     None         Melene Plan, [place] 07/18/23 2058

## 2023-07-18 NOTE — ED Triage Notes (Addendum)
 Patient arrived by EMS from [place] at [place] after falling out of her recliner chair. Denies any LOC, not on any blood thinners. Patient arrived to the ER with c-collar in place. Laceration to the left cheek and complaining of pain all over. Patient has dementia at baseline. Per nursing home has been having diarrhea and vomiting and was diagnosed with norovirus X 4 days.

## 2023-07-19 DIAGNOSIS — I82432 Acute embolism and thrombosis of left popliteal vein: Secondary | ICD-10-CM | POA: Diagnosis not present

## 2023-07-19 DIAGNOSIS — I959 Hypotension, unspecified: Secondary | ICD-10-CM | POA: Diagnosis not present

## 2023-07-19 DIAGNOSIS — Z743 Need for continuous supervision: Secondary | ICD-10-CM | POA: Diagnosis not present

## 2023-07-19 DIAGNOSIS — I82412 Acute embolism and thrombosis of left femoral vein: Secondary | ICD-10-CM | POA: Diagnosis not present

## 2023-07-19 DIAGNOSIS — S01412A Laceration without foreign body of left cheek and temporomandibular area, initial encounter: Secondary | ICD-10-CM | POA: Diagnosis not present

## 2023-07-19 DIAGNOSIS — R4182 Altered mental status, unspecified: Secondary | ICD-10-CM | POA: Diagnosis not present

## 2023-07-19 MED ORDER — ONDANSETRON 4 MG PO TBDP
4.0000 mg | ORAL_TABLET | Freq: Once | ORAL | Status: AC
  Administered 2023-07-19: 4 mg via ORAL
  Filled 2023-07-19: qty 1

## 2023-07-19 MED ORDER — ACETAMINOPHEN 500 MG PO TABS
1000.0000 mg | ORAL_TABLET | Freq: Once | ORAL | Status: DC
  Filled 2023-07-19: qty 2

## 2023-07-19 MED ORDER — KETOROLAC TROMETHAMINE 15 MG/ML IJ SOLN
15.0000 mg | Freq: Once | INTRAMUSCULAR | Status: AC
  Administered 2023-07-19: 15 mg via INTRAMUSCULAR
  Filled 2023-07-19: qty 1

## 2023-07-19 MED ORDER — ACETAMINOPHEN 10 MG/ML IV SOLN
1000.0000 mg | Freq: Four times a day (QID) | INTRAVENOUS | Status: DC
Start: 2023-07-19 — End: 2023-07-19

## 2023-07-19 NOTE — ED Notes (Signed)
 Attempted to give pt PO tylenol. Pt unable to swallow and started spitting up. Pt and family state she is not able to swallow pills at baseline.

## 2023-07-19 NOTE — ED Notes (Signed)
 Pt family and pt is upset and didn't understand why they were waiting for PTAR. Family and pt informed they are doing the best they can and that there is currently delayed. They asked if pt could be admitted instead and this nurse educated them that unfortunately that is not how it works and that there is a delay in getting a room upstairs at this time as well.

## 2023-07-19 NOTE — ED Notes (Signed)
 Pt brief changed. Pt repositioned. Pt has no other needs at this time. Family sitting with pt.

## 2023-07-20 DIAGNOSIS — I10 Essential (primary) hypertension: Secondary | ICD-10-CM | POA: Diagnosis not present

## 2023-07-20 DIAGNOSIS — M51369 Other intervertebral disc degeneration, lumbar region without mention of lumbar back pain or lower extremity pain: Secondary | ICD-10-CM | POA: Diagnosis not present

## 2023-07-20 DIAGNOSIS — S098XXA Other specified injuries of head, initial encounter: Secondary | ICD-10-CM | POA: Diagnosis not present

## 2023-07-20 DIAGNOSIS — M810 Age-related osteoporosis without current pathological fracture: Secondary | ICD-10-CM | POA: Diagnosis not present

## 2023-07-20 DIAGNOSIS — E43 Unspecified severe protein-calorie malnutrition: Secondary | ICD-10-CM | POA: Diagnosis not present

## 2023-07-20 DIAGNOSIS — R600 Localized edema: Secondary | ICD-10-CM | POA: Diagnosis not present

## 2023-07-20 DIAGNOSIS — Z8719 Personal history of other diseases of the digestive system: Secondary | ICD-10-CM | POA: Diagnosis not present

## 2023-07-20 DIAGNOSIS — F419 Anxiety disorder, unspecified: Secondary | ICD-10-CM | POA: Diagnosis not present

## 2023-07-20 DIAGNOSIS — F0394 Unspecified dementia, unspecified severity, with anxiety: Secondary | ICD-10-CM | POA: Diagnosis not present

## 2023-07-20 DIAGNOSIS — L03119 Cellulitis of unspecified part of limb: Secondary | ICD-10-CM | POA: Diagnosis not present

## 2023-07-20 DIAGNOSIS — I82403 Acute embolism and thrombosis of unspecified deep veins of lower extremity, bilateral: Secondary | ICD-10-CM | POA: Diagnosis not present

## 2023-07-22 DIAGNOSIS — I10 Essential (primary) hypertension: Secondary | ICD-10-CM | POA: Diagnosis not present

## 2023-07-22 DIAGNOSIS — M51369 Other intervertebral disc degeneration, lumbar region without mention of lumbar back pain or lower extremity pain: Secondary | ICD-10-CM | POA: Diagnosis not present

## 2023-07-22 DIAGNOSIS — M810 Age-related osteoporosis without current pathological fracture: Secondary | ICD-10-CM | POA: Diagnosis not present

## 2023-07-22 DIAGNOSIS — E43 Unspecified severe protein-calorie malnutrition: Secondary | ICD-10-CM | POA: Diagnosis not present

## 2023-07-22 DIAGNOSIS — F0394 Unspecified dementia, unspecified severity, with anxiety: Secondary | ICD-10-CM | POA: Diagnosis not present

## 2023-07-22 DIAGNOSIS — F419 Anxiety disorder, unspecified: Secondary | ICD-10-CM | POA: Diagnosis not present

## 2023-07-23 ENCOUNTER — Ambulatory Visit: Payer: Medicare Other | Admitting: Hematology & Oncology

## 2023-07-23 ENCOUNTER — Inpatient Hospital Stay: Payer: Medicare Other

## 2023-07-23 DIAGNOSIS — M51369 Other intervertebral disc degeneration, lumbar region without mention of lumbar back pain or lower extremity pain: Secondary | ICD-10-CM | POA: Diagnosis not present

## 2023-07-23 DIAGNOSIS — F419 Anxiety disorder, unspecified: Secondary | ICD-10-CM | POA: Diagnosis not present

## 2023-07-23 DIAGNOSIS — E43 Unspecified severe protein-calorie malnutrition: Secondary | ICD-10-CM | POA: Diagnosis not present

## 2023-07-23 DIAGNOSIS — F0394 Unspecified dementia, unspecified severity, with anxiety: Secondary | ICD-10-CM | POA: Diagnosis not present

## 2023-07-23 DIAGNOSIS — M810 Age-related osteoporosis without current pathological fracture: Secondary | ICD-10-CM | POA: Diagnosis not present

## 2023-07-23 DIAGNOSIS — I10 Essential (primary) hypertension: Secondary | ICD-10-CM | POA: Diagnosis not present

## 2023-07-27 DIAGNOSIS — Z872 Personal history of diseases of the skin and subcutaneous tissue: Secondary | ICD-10-CM | POA: Diagnosis not present

## 2023-07-27 DIAGNOSIS — Z9181 History of falling: Secondary | ICD-10-CM | POA: Diagnosis not present

## 2023-07-27 DIAGNOSIS — K449 Diaphragmatic hernia without obstruction or gangrene: Secondary | ICD-10-CM | POA: Diagnosis not present

## 2023-07-27 DIAGNOSIS — E43 Unspecified severe protein-calorie malnutrition: Secondary | ICD-10-CM | POA: Diagnosis not present

## 2023-07-27 DIAGNOSIS — Z681 Body mass index (BMI) 19 or less, adult: Secondary | ICD-10-CM | POA: Diagnosis not present

## 2023-07-27 DIAGNOSIS — M5126 Other intervertebral disc displacement, lumbar region: Secondary | ICD-10-CM | POA: Diagnosis not present

## 2023-07-27 DIAGNOSIS — Z853 Personal history of malignant neoplasm of breast: Secondary | ICD-10-CM | POA: Diagnosis not present

## 2023-07-27 DIAGNOSIS — S81802D Unspecified open wound, left lower leg, subsequent encounter: Secondary | ICD-10-CM | POA: Diagnosis not present

## 2023-07-27 DIAGNOSIS — R109 Unspecified abdominal pain: Secondary | ICD-10-CM | POA: Diagnosis not present

## 2023-07-27 DIAGNOSIS — B370 Candidal stomatitis: Secondary | ICD-10-CM | POA: Diagnosis not present

## 2023-07-27 DIAGNOSIS — M47816 Spondylosis without myelopathy or radiculopathy, lumbar region: Secondary | ICD-10-CM | POA: Diagnosis not present

## 2023-07-27 DIAGNOSIS — R630 Anorexia: Secondary | ICD-10-CM | POA: Diagnosis not present

## 2023-07-27 DIAGNOSIS — Z8616 Personal history of COVID-19: Secondary | ICD-10-CM | POA: Diagnosis not present

## 2023-07-27 DIAGNOSIS — Z9012 Acquired absence of left breast and nipple: Secondary | ICD-10-CM | POA: Diagnosis not present

## 2023-07-27 DIAGNOSIS — M810 Age-related osteoporosis without current pathological fracture: Secondary | ICD-10-CM | POA: Diagnosis not present

## 2023-07-27 DIAGNOSIS — R1310 Dysphagia, unspecified: Secondary | ICD-10-CM | POA: Diagnosis not present

## 2023-07-27 DIAGNOSIS — S81801D Unspecified open wound, right lower leg, subsequent encounter: Secondary | ICD-10-CM | POA: Diagnosis not present

## 2023-07-27 DIAGNOSIS — M48061 Spinal stenosis, lumbar region without neurogenic claudication: Secondary | ICD-10-CM | POA: Diagnosis not present

## 2023-07-27 DIAGNOSIS — E785 Hyperlipidemia, unspecified: Secondary | ICD-10-CM | POA: Diagnosis not present

## 2023-07-27 DIAGNOSIS — R600 Localized edema: Secondary | ICD-10-CM | POA: Diagnosis not present

## 2023-07-27 DIAGNOSIS — K2970 Gastritis, unspecified, without bleeding: Secondary | ICD-10-CM | POA: Diagnosis not present

## 2023-07-27 DIAGNOSIS — M2578 Osteophyte, vertebrae: Secondary | ICD-10-CM | POA: Diagnosis not present

## 2023-07-27 DIAGNOSIS — F0394 Unspecified dementia, unspecified severity, with anxiety: Secondary | ICD-10-CM | POA: Diagnosis not present

## 2023-07-27 DIAGNOSIS — Z7901 Long term (current) use of anticoagulants: Secondary | ICD-10-CM | POA: Diagnosis not present

## 2023-07-27 DIAGNOSIS — M1990 Unspecified osteoarthritis, unspecified site: Secondary | ICD-10-CM | POA: Diagnosis not present

## 2023-07-27 DIAGNOSIS — E876 Hypokalemia: Secondary | ICD-10-CM | POA: Diagnosis not present

## 2023-07-27 DIAGNOSIS — K219 Gastro-esophageal reflux disease without esophagitis: Secondary | ICD-10-CM | POA: Diagnosis not present

## 2023-07-27 DIAGNOSIS — I10 Essential (primary) hypertension: Secondary | ICD-10-CM | POA: Diagnosis not present

## 2023-07-27 DIAGNOSIS — I82403 Acute embolism and thrombosis of unspecified deep veins of lower extremity, bilateral: Secondary | ICD-10-CM | POA: Diagnosis not present

## 2023-07-27 DIAGNOSIS — S81809A Unspecified open wound, unspecified lower leg, initial encounter: Secondary | ICD-10-CM | POA: Diagnosis not present

## 2023-07-27 DIAGNOSIS — F419 Anxiety disorder, unspecified: Secondary | ICD-10-CM | POA: Diagnosis not present

## 2023-07-27 DIAGNOSIS — R339 Retention of urine, unspecified: Secondary | ICD-10-CM | POA: Diagnosis not present

## 2023-07-28 DIAGNOSIS — S81801D Unspecified open wound, right lower leg, subsequent encounter: Secondary | ICD-10-CM | POA: Diagnosis not present

## 2023-07-28 DIAGNOSIS — R1310 Dysphagia, unspecified: Secondary | ICD-10-CM | POA: Diagnosis not present

## 2023-07-28 DIAGNOSIS — I10 Essential (primary) hypertension: Secondary | ICD-10-CM | POA: Diagnosis not present

## 2023-07-28 DIAGNOSIS — S81802D Unspecified open wound, left lower leg, subsequent encounter: Secondary | ICD-10-CM | POA: Diagnosis not present

## 2023-07-28 DIAGNOSIS — F0394 Unspecified dementia, unspecified severity, with anxiety: Secondary | ICD-10-CM | POA: Diagnosis not present

## 2023-07-28 DIAGNOSIS — E43 Unspecified severe protein-calorie malnutrition: Secondary | ICD-10-CM | POA: Diagnosis not present

## 2023-07-30 DIAGNOSIS — S81801D Unspecified open wound, right lower leg, subsequent encounter: Secondary | ICD-10-CM | POA: Diagnosis not present

## 2023-07-30 DIAGNOSIS — R1310 Dysphagia, unspecified: Secondary | ICD-10-CM | POA: Diagnosis not present

## 2023-07-30 DIAGNOSIS — E43 Unspecified severe protein-calorie malnutrition: Secondary | ICD-10-CM | POA: Diagnosis not present

## 2023-07-30 DIAGNOSIS — I10 Essential (primary) hypertension: Secondary | ICD-10-CM | POA: Diagnosis not present

## 2023-07-30 DIAGNOSIS — S81802D Unspecified open wound, left lower leg, subsequent encounter: Secondary | ICD-10-CM | POA: Diagnosis not present

## 2023-07-30 DIAGNOSIS — F0394 Unspecified dementia, unspecified severity, with anxiety: Secondary | ICD-10-CM | POA: Diagnosis not present

## 2023-08-02 DIAGNOSIS — R1310 Dysphagia, unspecified: Secondary | ICD-10-CM | POA: Diagnosis not present

## 2023-08-02 DIAGNOSIS — E43 Unspecified severe protein-calorie malnutrition: Secondary | ICD-10-CM | POA: Diagnosis not present

## 2023-08-02 DIAGNOSIS — S81802D Unspecified open wound, left lower leg, subsequent encounter: Secondary | ICD-10-CM | POA: Diagnosis not present

## 2023-08-02 DIAGNOSIS — I10 Essential (primary) hypertension: Secondary | ICD-10-CM | POA: Diagnosis not present

## 2023-08-02 DIAGNOSIS — S81801D Unspecified open wound, right lower leg, subsequent encounter: Secondary | ICD-10-CM | POA: Diagnosis not present

## 2023-08-02 DIAGNOSIS — F0394 Unspecified dementia, unspecified severity, with anxiety: Secondary | ICD-10-CM | POA: Diagnosis not present

## 2023-08-03 DIAGNOSIS — F0394 Unspecified dementia, unspecified severity, with anxiety: Secondary | ICD-10-CM | POA: Diagnosis not present

## 2023-08-03 DIAGNOSIS — R1310 Dysphagia, unspecified: Secondary | ICD-10-CM | POA: Diagnosis not present

## 2023-08-03 DIAGNOSIS — E43 Unspecified severe protein-calorie malnutrition: Secondary | ICD-10-CM | POA: Diagnosis not present

## 2023-08-03 DIAGNOSIS — S81801D Unspecified open wound, right lower leg, subsequent encounter: Secondary | ICD-10-CM | POA: Diagnosis not present

## 2023-08-03 DIAGNOSIS — I10 Essential (primary) hypertension: Secondary | ICD-10-CM | POA: Diagnosis not present

## 2023-08-03 DIAGNOSIS — S81802D Unspecified open wound, left lower leg, subsequent encounter: Secondary | ICD-10-CM | POA: Diagnosis not present

## 2023-08-05 DIAGNOSIS — S81801D Unspecified open wound, right lower leg, subsequent encounter: Secondary | ICD-10-CM | POA: Diagnosis not present

## 2023-08-05 DIAGNOSIS — R195 Other fecal abnormalities: Secondary | ICD-10-CM | POA: Diagnosis not present

## 2023-08-05 DIAGNOSIS — E43 Unspecified severe protein-calorie malnutrition: Secondary | ICD-10-CM | POA: Diagnosis not present

## 2023-08-05 DIAGNOSIS — R1310 Dysphagia, unspecified: Secondary | ICD-10-CM | POA: Diagnosis not present

## 2023-08-05 DIAGNOSIS — R109 Unspecified abdominal pain: Secondary | ICD-10-CM | POA: Diagnosis not present

## 2023-08-05 DIAGNOSIS — F0394 Unspecified dementia, unspecified severity, with anxiety: Secondary | ICD-10-CM | POA: Diagnosis not present

## 2023-08-05 DIAGNOSIS — S81802D Unspecified open wound, left lower leg, subsequent encounter: Secondary | ICD-10-CM | POA: Diagnosis not present

## 2023-08-05 DIAGNOSIS — I10 Essential (primary) hypertension: Secondary | ICD-10-CM | POA: Diagnosis not present

## 2023-08-06 DIAGNOSIS — I10 Essential (primary) hypertension: Secondary | ICD-10-CM | POA: Diagnosis not present

## 2023-08-06 DIAGNOSIS — F0394 Unspecified dementia, unspecified severity, with anxiety: Secondary | ICD-10-CM | POA: Diagnosis not present

## 2023-08-06 DIAGNOSIS — S81802D Unspecified open wound, left lower leg, subsequent encounter: Secondary | ICD-10-CM | POA: Diagnosis not present

## 2023-08-06 DIAGNOSIS — R1310 Dysphagia, unspecified: Secondary | ICD-10-CM | POA: Diagnosis not present

## 2023-08-06 DIAGNOSIS — S81801D Unspecified open wound, right lower leg, subsequent encounter: Secondary | ICD-10-CM | POA: Diagnosis not present

## 2023-08-06 DIAGNOSIS — E43 Unspecified severe protein-calorie malnutrition: Secondary | ICD-10-CM | POA: Diagnosis not present

## 2023-08-10 DIAGNOSIS — F0394 Unspecified dementia, unspecified severity, with anxiety: Secondary | ICD-10-CM | POA: Diagnosis not present

## 2023-08-10 DIAGNOSIS — S81801D Unspecified open wound, right lower leg, subsequent encounter: Secondary | ICD-10-CM | POA: Diagnosis not present

## 2023-08-10 DIAGNOSIS — I10 Essential (primary) hypertension: Secondary | ICD-10-CM | POA: Diagnosis not present

## 2023-08-10 DIAGNOSIS — S81802D Unspecified open wound, left lower leg, subsequent encounter: Secondary | ICD-10-CM | POA: Diagnosis not present

## 2023-08-10 DIAGNOSIS — R1310 Dysphagia, unspecified: Secondary | ICD-10-CM | POA: Diagnosis not present

## 2023-08-10 DIAGNOSIS — E43 Unspecified severe protein-calorie malnutrition: Secondary | ICD-10-CM | POA: Diagnosis not present

## 2023-08-11 DIAGNOSIS — E43 Unspecified severe protein-calorie malnutrition: Secondary | ICD-10-CM | POA: Diagnosis not present

## 2023-08-11 DIAGNOSIS — R1310 Dysphagia, unspecified: Secondary | ICD-10-CM | POA: Diagnosis not present

## 2023-08-11 DIAGNOSIS — S81802D Unspecified open wound, left lower leg, subsequent encounter: Secondary | ICD-10-CM | POA: Diagnosis not present

## 2023-08-11 DIAGNOSIS — S81801D Unspecified open wound, right lower leg, subsequent encounter: Secondary | ICD-10-CM | POA: Diagnosis not present

## 2023-08-11 DIAGNOSIS — I10 Essential (primary) hypertension: Secondary | ICD-10-CM | POA: Diagnosis not present

## 2023-08-11 DIAGNOSIS — F0394 Unspecified dementia, unspecified severity, with anxiety: Secondary | ICD-10-CM | POA: Diagnosis not present

## 2023-08-12 DIAGNOSIS — R1310 Dysphagia, unspecified: Secondary | ICD-10-CM | POA: Diagnosis not present

## 2023-08-12 DIAGNOSIS — S81802D Unspecified open wound, left lower leg, subsequent encounter: Secondary | ICD-10-CM | POA: Diagnosis not present

## 2023-08-12 DIAGNOSIS — I10 Essential (primary) hypertension: Secondary | ICD-10-CM | POA: Diagnosis not present

## 2023-08-12 DIAGNOSIS — F0394 Unspecified dementia, unspecified severity, with anxiety: Secondary | ICD-10-CM | POA: Diagnosis not present

## 2023-08-12 DIAGNOSIS — E43 Unspecified severe protein-calorie malnutrition: Secondary | ICD-10-CM | POA: Diagnosis not present

## 2023-08-12 DIAGNOSIS — S81801D Unspecified open wound, right lower leg, subsequent encounter: Secondary | ICD-10-CM | POA: Diagnosis not present

## 2023-08-13 DIAGNOSIS — S81801D Unspecified open wound, right lower leg, subsequent encounter: Secondary | ICD-10-CM | POA: Diagnosis not present

## 2023-08-13 DIAGNOSIS — I10 Essential (primary) hypertension: Secondary | ICD-10-CM | POA: Diagnosis not present

## 2023-08-13 DIAGNOSIS — E43 Unspecified severe protein-calorie malnutrition: Secondary | ICD-10-CM | POA: Diagnosis not present

## 2023-08-13 DIAGNOSIS — R1310 Dysphagia, unspecified: Secondary | ICD-10-CM | POA: Diagnosis not present

## 2023-08-13 DIAGNOSIS — F0394 Unspecified dementia, unspecified severity, with anxiety: Secondary | ICD-10-CM | POA: Diagnosis not present

## 2023-08-13 DIAGNOSIS — S81802D Unspecified open wound, left lower leg, subsequent encounter: Secondary | ICD-10-CM | POA: Diagnosis not present

## 2023-08-14 ENCOUNTER — Other Ambulatory Visit: Payer: Self-pay

## 2023-08-14 ENCOUNTER — Emergency Department (HOSPITAL_COMMUNITY)

## 2023-08-14 ENCOUNTER — Emergency Department (HOSPITAL_COMMUNITY)
Admission: EM | Admit: 2023-08-14 | Discharge: 2023-08-15 | Disposition: A | Discharge status: Home / Self Care | Attending: Emergency Medicine | Admitting: Emergency Medicine

## 2023-08-14 DIAGNOSIS — S62352A Nondisplaced fracture of shaft of third metacarpal bone, right hand, initial encounter for closed fracture: Secondary | ICD-10-CM | POA: Diagnosis not present

## 2023-08-14 DIAGNOSIS — S0990XA Unspecified injury of head, initial encounter: Secondary | ICD-10-CM | POA: Insufficient documentation

## 2023-08-14 DIAGNOSIS — M542 Cervicalgia: Secondary | ICD-10-CM | POA: Diagnosis not present

## 2023-08-14 DIAGNOSIS — W19XXXA Unspecified fall, initial encounter: Secondary | ICD-10-CM

## 2023-08-14 DIAGNOSIS — Y92129 Unspecified place in nursing home as the place of occurrence of the external cause: Secondary | ICD-10-CM | POA: Insufficient documentation

## 2023-08-14 DIAGNOSIS — S51012A Laceration without foreign body of left elbow, initial encounter: Secondary | ICD-10-CM | POA: Diagnosis not present

## 2023-08-14 DIAGNOSIS — I10 Essential (primary) hypertension: Secondary | ICD-10-CM | POA: Diagnosis not present

## 2023-08-14 DIAGNOSIS — S62315A Displaced fracture of base of fourth metacarpal bone, left hand, initial encounter for closed fracture: Secondary | ICD-10-CM | POA: Diagnosis not present

## 2023-08-14 DIAGNOSIS — Z7901 Long term (current) use of anticoagulants: Secondary | ICD-10-CM | POA: Diagnosis not present

## 2023-08-14 DIAGNOSIS — R911 Solitary pulmonary nodule: Secondary | ICD-10-CM

## 2023-08-14 DIAGNOSIS — I672 Cerebral atherosclerosis: Secondary | ICD-10-CM | POA: Diagnosis not present

## 2023-08-14 DIAGNOSIS — I700 Atherosclerosis of aorta: Secondary | ICD-10-CM | POA: Diagnosis not present

## 2023-08-14 DIAGNOSIS — F0390 Unspecified dementia without behavioral disturbance: Secondary | ICD-10-CM | POA: Insufficient documentation

## 2023-08-14 DIAGNOSIS — M5030 Other cervical disc degeneration, unspecified cervical region: Secondary | ICD-10-CM | POA: Diagnosis not present

## 2023-08-14 DIAGNOSIS — S22030A Wedge compression fracture of third thoracic vertebra, initial encounter for closed fracture: Secondary | ICD-10-CM

## 2023-08-14 DIAGNOSIS — S62305A Unspecified fracture of fourth metacarpal bone, left hand, initial encounter for closed fracture: Secondary | ICD-10-CM

## 2023-08-14 DIAGNOSIS — M25552 Pain in left hip: Secondary | ICD-10-CM | POA: Diagnosis not present

## 2023-08-14 DIAGNOSIS — S79912A Unspecified injury of left hip, initial encounter: Secondary | ICD-10-CM | POA: Diagnosis not present

## 2023-08-14 DIAGNOSIS — S6992XA Unspecified injury of left wrist, hand and finger(s), initial encounter: Secondary | ICD-10-CM | POA: Diagnosis present

## 2023-08-14 DIAGNOSIS — S299XXA Unspecified injury of thorax, initial encounter: Secondary | ICD-10-CM | POA: Diagnosis not present

## 2023-08-14 DIAGNOSIS — K449 Diaphragmatic hernia without obstruction or gangrene: Secondary | ICD-10-CM | POA: Diagnosis not present

## 2023-08-14 DIAGNOSIS — M25512 Pain in left shoulder: Secondary | ICD-10-CM | POA: Insufficient documentation

## 2023-08-14 DIAGNOSIS — S199XXA Unspecified injury of neck, initial encounter: Secondary | ICD-10-CM | POA: Diagnosis not present

## 2023-08-14 DIAGNOSIS — M4312 Spondylolisthesis, cervical region: Secondary | ICD-10-CM | POA: Diagnosis not present

## 2023-08-14 LAB — CBC WITH DIFFERENTIAL/PLATELET
Abs Immature Granulocytes: 0.04 10*3/uL (ref 0.00–0.07)
Basophils Absolute: 0 10*3/uL (ref 0.0–0.1)
Basophils Relative: 0 %
Eosinophils Absolute: 0.3 10*3/uL (ref 0.0–0.5)
Eosinophils Relative: 4 %
HCT: 38.2 % (ref 36.0–46.0)
Hemoglobin: 12.4 g/dL (ref 12.0–15.0)
Immature Granulocytes: 1 %
Lymphocytes Relative: 21 %
Lymphs Abs: 1.6 10*3/uL (ref 0.7–4.0)
MCH: 28.8 pg (ref 26.0–34.0)
MCHC: 32.5 g/dL (ref 30.0–36.0)
MCV: 88.6 fL (ref 80.0–100.0)
Monocytes Absolute: 0.7 10*3/uL (ref 0.1–1.0)
Monocytes Relative: 10 %
Neutro Abs: 4.7 10*3/uL (ref 1.7–7.7)
Neutrophils Relative %: 64 %
Platelets: 218 10*3/uL (ref 150–400)
RBC: 4.31 MIL/uL (ref 3.87–5.11)
RDW: 13.3 % (ref 11.5–15.5)
WBC: 7.3 10*3/uL (ref 4.0–10.5)
nRBC: 0 % (ref 0.0–0.2)

## 2023-08-14 LAB — BASIC METABOLIC PANEL WITH GFR
Anion gap: 12 (ref 5–15)
BUN: 13 mg/dL (ref 8–23)
CO2: 22 mmol/L (ref 22–32)
Calcium: 8.4 mg/dL — ABNORMAL LOW (ref 8.9–10.3)
Chloride: 108 mmol/L (ref 98–111)
Creatinine, Ser: 0.91 mg/dL (ref 0.44–1.00)
GFR, Estimated: 58 mL/min — ABNORMAL LOW (ref >60)
Glucose, Bld: 120 mg/dL — ABNORMAL HIGH (ref 70–99)
Potassium: 4 mmol/L (ref 3.5–5.1)
Sodium: 142 mmol/L (ref 135–145)

## 2023-08-14 LAB — I-STAT CHEM 8, ED
BUN: 14 mg/dL (ref 8–23)
Calcium, Ion: 1.12 mmol/L — ABNORMAL LOW (ref 1.15–1.40)
Chloride: 106 mmol/L (ref 98–111)
Creatinine, Ser: 0.9 mg/dL (ref 0.44–1.00)
Glucose, Bld: 118 mg/dL — ABNORMAL HIGH (ref 70–99)
HCT: 37 % (ref 36.0–46.0)
Hemoglobin: 12.6 g/dL (ref 12.0–15.0)
Potassium: 4 mmol/L (ref 3.5–5.1)
Sodium: 142 mmol/L (ref 135–145)
TCO2: 23 mmol/L (ref 22–32)

## 2023-08-14 MED ORDER — OXYCODONE HCL 5 MG PO TABS: 2.5000 mg | ORAL_TABLET | ORAL | 0 refills | Status: DC | PRN

## 2023-08-14 MED ORDER — FENTANYL CITRATE PF 50 MCG/ML IJ SOSY
50.0000 ug | PREFILLED_SYRINGE | Freq: Once | INTRAMUSCULAR | Status: AC
  Administered 2023-08-14: 50 ug via INTRAVENOUS
  Filled 2023-08-14: qty 1

## 2023-08-14 NOTE — Progress Notes (Signed)
 Orthopedic Tech Progress Note Patient Details:  Nancy Bell 12/03/1924 [id number]  Patient ID: Nancy Bell, female   DOB: Oct 28, 1924, 89 y.o.   MRN: [id number] I attended trauma page. Trinna Post 08/14/2023, 8:08 PM

## 2023-08-14 NOTE — ED Notes (Signed)
 Pt to CT at this time with this RN to monitor.

## 2023-08-14 NOTE — Discharge Instructions (Signed)
 You were seen for your fall in the emergency department.  You broke your left hand.  You also have a T3 fracture of your thoracic spine.   At home, please take tylenol for your pain.    Check your MyChart online for the results of any tests that had not resulted by the time you left the emergency department.   Follow-up with your primary doctor in 2-3 days regarding your visit.  Follow-up with hand surgery about your broken hand. Follow-up with the spine doctors about your spine fracture if you are still having pain.   Return immediately to the emergency department if you experience any of the following: severe pain, or any other concerning symptoms.    Thank you for visiting our Emergency Department. It was a pleasure taking care of you today.

## 2023-08-14 NOTE — Progress Notes (Signed)
 Chaplain responds to Level 2 trauma, fall on thinners, and provides compassionate presence as medical staff care for pt.No family is present and she tells nurse to delay calling her family as she doesn't want them to hassle her for falling.

## 2023-08-14 NOTE — ED Provider Notes (Signed)
 [place] EMERGENCY DEPARTMENT AT [facility] Provider Note   CSN: [id number] Arrival date & time: 08/14/23  1938     History {Add pertinent medical, surgical, social history, OB history to HPI:1} No chief complaint on file.   Nancy Bell is a 89 y.o. female.  89 year old female with a history of hypertension, hyperlipidemia, dementia, and Eliquis (unclear indication) who presents emergency department after a fall.  History obtained per [facility] staff.  Patient attempted to walk without assistance and had an unwitnessed fall.  They found her on the ground shortly afterwards and heard her fall.  Has been complaining of pain in multiple locations since then including her left neck, shoulder, and hip on the left side.  Patient unable to provide additional history.  Last dose of Eliquis today a.m. this morning.       Home Medications Prior to Admission medications   Medication Sig Start Date End Date Taking? Authorizing Provider  ALPRAZolam (XANAX) 0.25 MG tablet Take 0.125 mg by mouth 2 (two) times daily. 01/23/15   [provider]  Coenzyme Q10 (CO Q 10 PO) Take 1 capsule by mouth daily.    [provider]  loperamide (IMODIUM A-D) 2 MG tablet Take 1 tablet (2 mg total) by mouth 3 (three) times daily as needed for diarrhea or loose stools. 10/02/22   Pokhrel, Rebekah Chesterfield, MD  lovastatin (MEVACOR) 40 MG tablet Take 40 mg by mouth daily. 01/31/15   [provider]  naproxen sodium (ALEVE) 220 MG tablet Take 220 mg by mouth daily as needed (pain).    [provider]  omeprazole (PRILOSEC) 20 MG capsule Take 20 mg by mouth daily.    [provider]  Potassium Chloride ER 20 MEQ TBCR Take 20 mEq by mouth 2 (two) times daily. 03/10/23   [provider]      Allergies    Augmentin [amoxicillin-pot clavulanate], Vicodin [hydrocodone-acetaminophen], Oxycontin [oxycodone hcl], Tylenol [acetaminophen], and Ultram [tramadol hcl]     Review of Systems   Review of Systems  Physical Exam Updated Vital Signs There were no vitals taken for this visit. Physical Exam Constitutional:      General: She is not in acute distress.    Appearance: Normal appearance. She is not ill-appearing.  HENT:     Head: Normocephalic and atraumatic.     Right Ear: External ear normal.     Left Ear: External ear normal.     Mouth/Throat:     Mouth: Mucous membranes are moist.     Pharynx: Oropharynx is clear.  Eyes:     Extraocular Movements: Extraocular movements intact.     Conjunctiva/sclera: Conjunctivae normal.     Pupils: Pupils are equal, round, and reactive to light.  Neck:     Comments: No C-spine midline tenderness to palpation Cardiovascular:     Rate and Rhythm: Normal rate and regular rhythm.     Pulses: Normal pulses.     Heart sounds: Normal heart sounds.  Pulmonary:     Effort: Pulmonary effort is normal. No respiratory distress.     Breath sounds: Normal breath sounds.  Abdominal:     General: Abdomen is flat. There is no distension.     Palpations: Abdomen is soft. There is no mass.     Tenderness: There is no abdominal tenderness. There is no guarding.  Musculoskeletal:        General: No deformity. Normal range of motion.     Cervical back: No  rigidity or tenderness.     Comments: No tenderness to palpation of midline thoracic or lumbar spine.  No step-offs palpated.  No tenderness to palpation of chest wall.  No bruising noted.  No tenderness to palpation of bilateral clavicles.  No tenderness to palpation, bruising, or deformities noted of bilateral wrists, knees, or ankles.  Tenderness palpation of left shoulder, left wrist, and left hip.  Small skin tear to left elbow.  Neurological:     General: No focal deficit present.     Mental Status: She is alert. Mental status is at baseline.     Cranial Nerves: No cranial nerve deficit.     Sensory: No sensory deficit.     Motor: No weakness.     ED  Results / Procedures / Treatments   Labs (all labs ordered are listed, but only abnormal results are displayed) Labs Reviewed  CBC WITH DIFFERENTIAL/PLATELET  BASIC METABOLIC PANEL WITH GFR    EKG None  Radiology No results found.  Procedures Procedures  {Document cardiac monitor, telemetry assessment procedure when appropriate:1}  Medications Ordered in ED Medications - No data to display  ED Course/ Medical Decision Making/ A&P   {   Click here for ABCD2, HEART and other calculatorsREFRESH Note before signing :1}                              Medical Decision Making Amount and/or Complexity of Data Reviewed Labs: ordered. Radiology: ordered.  Risk Prescription drug management.   ***  {Document critical care time when appropriate:1} {Document review of labs and clinical decision tools ie heart score, Chads2Vasc2 etc:1}  {Document your independent review of radiology images, and any outside records:1} {Document your discussion with family members, caretakers, and with consultants:1} {Document social determinants of health affecting pt's care:1} {Document your decision making why or why not admission, treatments were needed:1} Final Clinical Impression(s) / ED Diagnoses Final diagnoses:  None    Rx / DC Orders ED Discharge Orders     None

## 2023-08-14 NOTE — ED Triage Notes (Addendum)
 Pt BIB [facility] coming from [facility]. Pt was found lying on her L side with presumed unwitnessed fall. C/o L neck, L shoulder, L hand, L hip, and back pain. Denies LOC. Pt is oriented to her baseline, disoriented to time. Pt is on Elloquis 5mg  daily. Documentation stating pt last had Elloquis at 08:00 am today.  EMS Vitals  130/70 SpO2 99 HR 94 CBG 159

## 2023-08-14 NOTE — ED Notes (Addendum)
 Pt's daughter, Elnita Maxwell, notified that pt was in the ED.

## 2023-08-14 NOTE — ED Notes (Signed)
 C collar removed, ok with Dr.Paterson,

## 2023-08-15 DIAGNOSIS — S62315A Displaced fracture of base of fourth metacarpal bone, left hand, initial encounter for closed fracture: Secondary | ICD-10-CM | POA: Diagnosis not present

## 2023-08-15 DIAGNOSIS — Z743 Need for continuous supervision: Secondary | ICD-10-CM | POA: Diagnosis not present

## 2023-08-15 DIAGNOSIS — R4182 Altered mental status, unspecified: Secondary | ICD-10-CM | POA: Diagnosis not present

## 2023-08-15 MED ORDER — IBUPROFEN 400 MG PO TABS
400.0000 mg | ORAL_TABLET | Freq: Once | ORAL | Status: AC
  Administered 2023-08-15: 400 mg via ORAL
  Filled 2023-08-15: qty 1

## 2023-08-15 MED ORDER — ACETAMINOPHEN 500 MG PO TABS
1000.0000 mg | ORAL_TABLET | Freq: Once | ORAL | Status: AC
  Administered 2023-08-15: 1000 mg via ORAL
  Filled 2023-08-15: qty 2

## 2023-08-15 NOTE — Progress Notes (Signed)
 Orthopedic Tech Progress Note Patient Details:  Nancy Bell [birth year] [id number]  Ortho Devices Type of Ortho Device: Ulna gutter splint Ortho Device/Splint Location: lue Ortho Device/Splint Interventions: Ordered, Application, Adjustment   Post Interventions Patient Tolerated: Well Instructions Provided: Care of device, Adjustment of device  Trinna Post 08/15/2023, 12:03 AM

## 2023-08-16 DIAGNOSIS — R1310 Dysphagia, unspecified: Secondary | ICD-10-CM | POA: Diagnosis not present

## 2023-08-16 DIAGNOSIS — I10 Essential (primary) hypertension: Secondary | ICD-10-CM | POA: Diagnosis not present

## 2023-08-16 DIAGNOSIS — S81801D Unspecified open wound, right lower leg, subsequent encounter: Secondary | ICD-10-CM | POA: Diagnosis not present

## 2023-08-16 DIAGNOSIS — S81802D Unspecified open wound, left lower leg, subsequent encounter: Secondary | ICD-10-CM | POA: Diagnosis not present

## 2023-08-16 DIAGNOSIS — E43 Unspecified severe protein-calorie malnutrition: Secondary | ICD-10-CM | POA: Diagnosis not present

## 2023-08-16 DIAGNOSIS — F0394 Unspecified dementia, unspecified severity, with anxiety: Secondary | ICD-10-CM | POA: Diagnosis not present

## 2023-08-17 DIAGNOSIS — S62309D Unspecified fracture of unspecified metacarpal bone, subsequent encounter for fracture with routine healing: Secondary | ICD-10-CM | POA: Diagnosis not present

## 2023-08-17 DIAGNOSIS — R630 Anorexia: Secondary | ICD-10-CM | POA: Diagnosis not present

## 2023-08-17 DIAGNOSIS — I10 Essential (primary) hypertension: Secondary | ICD-10-CM | POA: Diagnosis not present

## 2023-08-17 DIAGNOSIS — R1310 Dysphagia, unspecified: Secondary | ICD-10-CM | POA: Diagnosis not present

## 2023-08-17 DIAGNOSIS — S81802D Unspecified open wound, left lower leg, subsequent encounter: Secondary | ICD-10-CM | POA: Diagnosis not present

## 2023-08-17 DIAGNOSIS — Z7901 Long term (current) use of anticoagulants: Secondary | ICD-10-CM | POA: Diagnosis not present

## 2023-08-17 DIAGNOSIS — M79605 Pain in left leg: Secondary | ICD-10-CM | POA: Diagnosis not present

## 2023-08-17 DIAGNOSIS — M79604 Pain in right leg: Secondary | ICD-10-CM | POA: Diagnosis not present

## 2023-08-17 DIAGNOSIS — S22039D Unspecified fracture of third thoracic vertebra, subsequent encounter for fracture with routine healing: Secondary | ICD-10-CM | POA: Diagnosis not present

## 2023-08-17 DIAGNOSIS — F329 Major depressive disorder, single episode, unspecified: Secondary | ICD-10-CM | POA: Diagnosis not present

## 2023-08-17 DIAGNOSIS — S81801D Unspecified open wound, right lower leg, subsequent encounter: Secondary | ICD-10-CM | POA: Diagnosis not present

## 2023-08-17 DIAGNOSIS — Z8619 Personal history of other infectious and parasitic diseases: Secondary | ICD-10-CM | POA: Diagnosis not present

## 2023-08-17 DIAGNOSIS — E43 Unspecified severe protein-calorie malnutrition: Secondary | ICD-10-CM | POA: Diagnosis not present

## 2023-08-17 DIAGNOSIS — F0394 Unspecified dementia, unspecified severity, with anxiety: Secondary | ICD-10-CM | POA: Diagnosis not present

## 2023-08-17 DIAGNOSIS — I82411 Acute embolism and thrombosis of right femoral vein: Secondary | ICD-10-CM | POA: Diagnosis not present

## 2023-08-17 DIAGNOSIS — I82432 Acute embolism and thrombosis of left popliteal vein: Secondary | ICD-10-CM | POA: Diagnosis not present

## 2023-08-17 DIAGNOSIS — K2970 Gastritis, unspecified, without bleeding: Secondary | ICD-10-CM | POA: Diagnosis not present

## 2023-08-17 DIAGNOSIS — Z681 Body mass index (BMI) 19 or less, adult: Secondary | ICD-10-CM | POA: Diagnosis not present

## 2023-08-18 DIAGNOSIS — I739 Peripheral vascular disease, unspecified: Secondary | ICD-10-CM | POA: Diagnosis not present

## 2023-08-18 DIAGNOSIS — B351 Tinea unguium: Secondary | ICD-10-CM | POA: Diagnosis not present

## 2023-08-18 DIAGNOSIS — M2012 Hallux valgus (acquired), left foot: Secondary | ICD-10-CM | POA: Diagnosis not present

## 2023-08-18 DIAGNOSIS — M79675 Pain in left toe(s): Secondary | ICD-10-CM | POA: Diagnosis not present

## 2023-08-19 DIAGNOSIS — F0394 Unspecified dementia, unspecified severity, with anxiety: Secondary | ICD-10-CM | POA: Diagnosis not present

## 2023-08-19 DIAGNOSIS — E43 Unspecified severe protein-calorie malnutrition: Secondary | ICD-10-CM | POA: Diagnosis not present

## 2023-08-19 DIAGNOSIS — S81801D Unspecified open wound, right lower leg, subsequent encounter: Secondary | ICD-10-CM | POA: Diagnosis not present

## 2023-08-19 DIAGNOSIS — S81802D Unspecified open wound, left lower leg, subsequent encounter: Secondary | ICD-10-CM | POA: Diagnosis not present

## 2023-08-19 DIAGNOSIS — I10 Essential (primary) hypertension: Secondary | ICD-10-CM | POA: Diagnosis not present

## 2023-08-19 DIAGNOSIS — R1310 Dysphagia, unspecified: Secondary | ICD-10-CM | POA: Diagnosis not present

## 2023-08-24 DIAGNOSIS — S81802D Unspecified open wound, left lower leg, subsequent encounter: Secondary | ICD-10-CM | POA: Diagnosis not present

## 2023-08-24 DIAGNOSIS — S81801D Unspecified open wound, right lower leg, subsequent encounter: Secondary | ICD-10-CM | POA: Diagnosis not present

## 2023-08-24 DIAGNOSIS — F0394 Unspecified dementia, unspecified severity, with anxiety: Secondary | ICD-10-CM | POA: Diagnosis not present

## 2023-08-24 DIAGNOSIS — E43 Unspecified severe protein-calorie malnutrition: Secondary | ICD-10-CM | POA: Diagnosis not present

## 2023-08-24 DIAGNOSIS — R1310 Dysphagia, unspecified: Secondary | ICD-10-CM | POA: Diagnosis not present

## 2023-08-24 DIAGNOSIS — I10 Essential (primary) hypertension: Secondary | ICD-10-CM | POA: Diagnosis not present

## 2023-08-25 DIAGNOSIS — S81802D Unspecified open wound, left lower leg, subsequent encounter: Secondary | ICD-10-CM | POA: Diagnosis not present

## 2023-08-25 DIAGNOSIS — F0394 Unspecified dementia, unspecified severity, with anxiety: Secondary | ICD-10-CM | POA: Diagnosis not present

## 2023-08-25 DIAGNOSIS — E43 Unspecified severe protein-calorie malnutrition: Secondary | ICD-10-CM | POA: Diagnosis not present

## 2023-08-25 DIAGNOSIS — R1310 Dysphagia, unspecified: Secondary | ICD-10-CM | POA: Diagnosis not present

## 2023-08-25 DIAGNOSIS — I10 Essential (primary) hypertension: Secondary | ICD-10-CM | POA: Diagnosis not present

## 2023-08-25 DIAGNOSIS — S81801D Unspecified open wound, right lower leg, subsequent encounter: Secondary | ICD-10-CM | POA: Diagnosis not present

## 2023-08-26 DIAGNOSIS — Z9181 History of falling: Secondary | ICD-10-CM | POA: Diagnosis not present

## 2023-08-26 DIAGNOSIS — M5126 Other intervertebral disc displacement, lumbar region: Secondary | ICD-10-CM | POA: Diagnosis not present

## 2023-08-26 DIAGNOSIS — R1310 Dysphagia, unspecified: Secondary | ICD-10-CM | POA: Diagnosis not present

## 2023-08-26 DIAGNOSIS — F0394 Unspecified dementia, unspecified severity, with anxiety: Secondary | ICD-10-CM | POA: Diagnosis not present

## 2023-08-26 DIAGNOSIS — M1990 Unspecified osteoarthritis, unspecified site: Secondary | ICD-10-CM | POA: Diagnosis not present

## 2023-08-26 DIAGNOSIS — S81801D Unspecified open wound, right lower leg, subsequent encounter: Secondary | ICD-10-CM | POA: Diagnosis not present

## 2023-08-26 DIAGNOSIS — F419 Anxiety disorder, unspecified: Secondary | ICD-10-CM | POA: Diagnosis not present

## 2023-08-26 DIAGNOSIS — M48061 Spinal stenosis, lumbar region without neurogenic claudication: Secondary | ICD-10-CM | POA: Diagnosis not present

## 2023-08-26 DIAGNOSIS — Z8616 Personal history of COVID-19: Secondary | ICD-10-CM | POA: Diagnosis not present

## 2023-08-26 DIAGNOSIS — I10 Essential (primary) hypertension: Secondary | ICD-10-CM | POA: Diagnosis not present

## 2023-08-26 DIAGNOSIS — E785 Hyperlipidemia, unspecified: Secondary | ICD-10-CM | POA: Diagnosis not present

## 2023-08-26 DIAGNOSIS — K449 Diaphragmatic hernia without obstruction or gangrene: Secondary | ICD-10-CM | POA: Diagnosis not present

## 2023-08-26 DIAGNOSIS — K219 Gastro-esophageal reflux disease without esophagitis: Secondary | ICD-10-CM | POA: Diagnosis not present

## 2023-08-26 DIAGNOSIS — M47816 Spondylosis without myelopathy or radiculopathy, lumbar region: Secondary | ICD-10-CM | POA: Diagnosis not present

## 2023-08-26 DIAGNOSIS — E43 Unspecified severe protein-calorie malnutrition: Secondary | ICD-10-CM | POA: Diagnosis not present

## 2023-08-26 DIAGNOSIS — R339 Retention of urine, unspecified: Secondary | ICD-10-CM | POA: Diagnosis not present

## 2023-08-26 DIAGNOSIS — M2578 Osteophyte, vertebrae: Secondary | ICD-10-CM | POA: Diagnosis not present

## 2023-08-26 DIAGNOSIS — M810 Age-related osteoporosis without current pathological fracture: Secondary | ICD-10-CM | POA: Diagnosis not present

## 2023-08-26 DIAGNOSIS — Z9012 Acquired absence of left breast and nipple: Secondary | ICD-10-CM | POA: Diagnosis not present

## 2023-08-26 DIAGNOSIS — Z853 Personal history of malignant neoplasm of breast: Secondary | ICD-10-CM | POA: Diagnosis not present

## 2023-08-26 DIAGNOSIS — S81802D Unspecified open wound, left lower leg, subsequent encounter: Secondary | ICD-10-CM | POA: Diagnosis not present

## 2023-08-27 DIAGNOSIS — S81801D Unspecified open wound, right lower leg, subsequent encounter: Secondary | ICD-10-CM | POA: Diagnosis not present

## 2023-08-27 DIAGNOSIS — E43 Unspecified severe protein-calorie malnutrition: Secondary | ICD-10-CM | POA: Diagnosis not present

## 2023-08-27 DIAGNOSIS — I10 Essential (primary) hypertension: Secondary | ICD-10-CM | POA: Diagnosis not present

## 2023-08-27 DIAGNOSIS — S81802D Unspecified open wound, left lower leg, subsequent encounter: Secondary | ICD-10-CM | POA: Diagnosis not present

## 2023-08-27 DIAGNOSIS — R1310 Dysphagia, unspecified: Secondary | ICD-10-CM | POA: Diagnosis not present

## 2023-08-27 DIAGNOSIS — F0394 Unspecified dementia, unspecified severity, with anxiety: Secondary | ICD-10-CM | POA: Diagnosis not present

## 2023-08-31 DIAGNOSIS — E43 Unspecified severe protein-calorie malnutrition: Secondary | ICD-10-CM | POA: Diagnosis not present

## 2023-08-31 DIAGNOSIS — R1310 Dysphagia, unspecified: Secondary | ICD-10-CM | POA: Diagnosis not present

## 2023-08-31 DIAGNOSIS — F0394 Unspecified dementia, unspecified severity, with anxiety: Secondary | ICD-10-CM | POA: Diagnosis not present

## 2023-08-31 DIAGNOSIS — S81801D Unspecified open wound, right lower leg, subsequent encounter: Secondary | ICD-10-CM | POA: Diagnosis not present

## 2023-08-31 DIAGNOSIS — I10 Essential (primary) hypertension: Secondary | ICD-10-CM | POA: Diagnosis not present

## 2023-08-31 DIAGNOSIS — S81802D Unspecified open wound, left lower leg, subsequent encounter: Secondary | ICD-10-CM | POA: Diagnosis not present

## 2023-09-01 DIAGNOSIS — S62345A Nondisplaced fracture of base of fourth metacarpal bone, left hand, initial encounter for closed fracture: Secondary | ICD-10-CM | POA: Diagnosis not present

## 2023-09-06 DIAGNOSIS — S81801D Unspecified open wound, right lower leg, subsequent encounter: Secondary | ICD-10-CM | POA: Diagnosis not present

## 2023-09-06 DIAGNOSIS — F0394 Unspecified dementia, unspecified severity, with anxiety: Secondary | ICD-10-CM | POA: Diagnosis not present

## 2023-09-06 DIAGNOSIS — R1310 Dysphagia, unspecified: Secondary | ICD-10-CM | POA: Diagnosis not present

## 2023-09-06 DIAGNOSIS — S81802D Unspecified open wound, left lower leg, subsequent encounter: Secondary | ICD-10-CM | POA: Diagnosis not present

## 2023-09-06 DIAGNOSIS — I10 Essential (primary) hypertension: Secondary | ICD-10-CM | POA: Diagnosis not present

## 2023-09-06 DIAGNOSIS — E43 Unspecified severe protein-calorie malnutrition: Secondary | ICD-10-CM | POA: Diagnosis not present

## 2023-09-09 DIAGNOSIS — S81802D Unspecified open wound, left lower leg, subsequent encounter: Secondary | ICD-10-CM | POA: Diagnosis not present

## 2023-09-09 DIAGNOSIS — E43 Unspecified severe protein-calorie malnutrition: Secondary | ICD-10-CM | POA: Diagnosis not present

## 2023-09-09 DIAGNOSIS — F0394 Unspecified dementia, unspecified severity, with anxiety: Secondary | ICD-10-CM | POA: Diagnosis not present

## 2023-09-09 DIAGNOSIS — R1310 Dysphagia, unspecified: Secondary | ICD-10-CM | POA: Diagnosis not present

## 2023-09-09 DIAGNOSIS — S81801D Unspecified open wound, right lower leg, subsequent encounter: Secondary | ICD-10-CM | POA: Diagnosis not present

## 2023-09-09 DIAGNOSIS — I10 Essential (primary) hypertension: Secondary | ICD-10-CM | POA: Diagnosis not present

## 2023-09-13 DIAGNOSIS — F0394 Unspecified dementia, unspecified severity, with anxiety: Secondary | ICD-10-CM | POA: Diagnosis not present

## 2023-09-13 DIAGNOSIS — I10 Essential (primary) hypertension: Secondary | ICD-10-CM | POA: Diagnosis not present

## 2023-09-13 DIAGNOSIS — R1310 Dysphagia, unspecified: Secondary | ICD-10-CM | POA: Diagnosis not present

## 2023-09-13 DIAGNOSIS — S81801D Unspecified open wound, right lower leg, subsequent encounter: Secondary | ICD-10-CM | POA: Diagnosis not present

## 2023-09-13 DIAGNOSIS — S81802D Unspecified open wound, left lower leg, subsequent encounter: Secondary | ICD-10-CM | POA: Diagnosis not present

## 2023-09-13 DIAGNOSIS — E43 Unspecified severe protein-calorie malnutrition: Secondary | ICD-10-CM | POA: Diagnosis not present

## 2023-09-14 DIAGNOSIS — F0394 Unspecified dementia, unspecified severity, with anxiety: Secondary | ICD-10-CM | POA: Diagnosis not present

## 2023-09-14 DIAGNOSIS — E43 Unspecified severe protein-calorie malnutrition: Secondary | ICD-10-CM | POA: Diagnosis not present

## 2023-09-14 DIAGNOSIS — S81802D Unspecified open wound, left lower leg, subsequent encounter: Secondary | ICD-10-CM | POA: Diagnosis not present

## 2023-09-14 DIAGNOSIS — I10 Essential (primary) hypertension: Secondary | ICD-10-CM | POA: Diagnosis not present

## 2023-09-14 DIAGNOSIS — R1310 Dysphagia, unspecified: Secondary | ICD-10-CM | POA: Diagnosis not present

## 2023-09-14 DIAGNOSIS — S81801D Unspecified open wound, right lower leg, subsequent encounter: Secondary | ICD-10-CM | POA: Diagnosis not present

## 2023-09-15 DIAGNOSIS — E43 Unspecified severe protein-calorie malnutrition: Secondary | ICD-10-CM | POA: Diagnosis not present

## 2023-09-15 DIAGNOSIS — R1310 Dysphagia, unspecified: Secondary | ICD-10-CM | POA: Diagnosis not present

## 2023-09-15 DIAGNOSIS — S81802D Unspecified open wound, left lower leg, subsequent encounter: Secondary | ICD-10-CM | POA: Diagnosis not present

## 2023-09-15 DIAGNOSIS — F0394 Unspecified dementia, unspecified severity, with anxiety: Secondary | ICD-10-CM | POA: Diagnosis not present

## 2023-09-15 DIAGNOSIS — I10 Essential (primary) hypertension: Secondary | ICD-10-CM | POA: Diagnosis not present

## 2023-09-15 DIAGNOSIS — S81801D Unspecified open wound, right lower leg, subsequent encounter: Secondary | ICD-10-CM | POA: Diagnosis not present

## 2023-09-21 DIAGNOSIS — S62345A Nondisplaced fracture of base of fourth metacarpal bone, left hand, initial encounter for closed fracture: Secondary | ICD-10-CM | POA: Diagnosis not present

## 2023-09-21 DIAGNOSIS — I10 Essential (primary) hypertension: Secondary | ICD-10-CM | POA: Diagnosis not present

## 2023-09-21 DIAGNOSIS — F0394 Unspecified dementia, unspecified severity, with anxiety: Secondary | ICD-10-CM | POA: Diagnosis not present

## 2023-09-21 DIAGNOSIS — E43 Unspecified severe protein-calorie malnutrition: Secondary | ICD-10-CM | POA: Diagnosis not present

## 2023-09-21 DIAGNOSIS — S81802D Unspecified open wound, left lower leg, subsequent encounter: Secondary | ICD-10-CM | POA: Diagnosis not present

## 2023-09-21 DIAGNOSIS — S81801D Unspecified open wound, right lower leg, subsequent encounter: Secondary | ICD-10-CM | POA: Diagnosis not present

## 2023-09-21 DIAGNOSIS — R1310 Dysphagia, unspecified: Secondary | ICD-10-CM | POA: Diagnosis not present

## 2023-09-22 DIAGNOSIS — E43 Unspecified severe protein-calorie malnutrition: Secondary | ICD-10-CM | POA: Diagnosis not present

## 2023-09-22 DIAGNOSIS — R1310 Dysphagia, unspecified: Secondary | ICD-10-CM | POA: Diagnosis not present

## 2023-09-22 DIAGNOSIS — S81801D Unspecified open wound, right lower leg, subsequent encounter: Secondary | ICD-10-CM | POA: Diagnosis not present

## 2023-09-22 DIAGNOSIS — S81802D Unspecified open wound, left lower leg, subsequent encounter: Secondary | ICD-10-CM | POA: Diagnosis not present

## 2023-09-22 DIAGNOSIS — F0394 Unspecified dementia, unspecified severity, with anxiety: Secondary | ICD-10-CM | POA: Diagnosis not present

## 2023-09-22 DIAGNOSIS — I10 Essential (primary) hypertension: Secondary | ICD-10-CM | POA: Diagnosis not present

## 2023-09-25 DIAGNOSIS — M80042D Age-related osteoporosis with current pathological fracture, left hand, subsequent encounter for fracture with routine healing: Secondary | ICD-10-CM | POA: Diagnosis not present

## 2023-09-25 DIAGNOSIS — F419 Anxiety disorder, unspecified: Secondary | ICD-10-CM | POA: Diagnosis not present

## 2023-09-25 DIAGNOSIS — I10 Essential (primary) hypertension: Secondary | ICD-10-CM | POA: Diagnosis not present

## 2023-09-25 DIAGNOSIS — M48061 Spinal stenosis, lumbar region without neurogenic claudication: Secondary | ICD-10-CM | POA: Diagnosis not present

## 2023-09-25 DIAGNOSIS — M2578 Osteophyte, vertebrae: Secondary | ICD-10-CM | POA: Diagnosis not present

## 2023-09-25 DIAGNOSIS — Z7901 Long term (current) use of anticoagulants: Secondary | ICD-10-CM | POA: Diagnosis not present

## 2023-09-25 DIAGNOSIS — R1310 Dysphagia, unspecified: Secondary | ICD-10-CM | POA: Diagnosis not present

## 2023-09-25 DIAGNOSIS — Z9012 Acquired absence of left breast and nipple: Secondary | ICD-10-CM | POA: Diagnosis not present

## 2023-09-25 DIAGNOSIS — E785 Hyperlipidemia, unspecified: Secondary | ICD-10-CM | POA: Diagnosis not present

## 2023-09-25 DIAGNOSIS — K219 Gastro-esophageal reflux disease without esophagitis: Secondary | ICD-10-CM | POA: Diagnosis not present

## 2023-09-25 DIAGNOSIS — M1990 Unspecified osteoarthritis, unspecified site: Secondary | ICD-10-CM | POA: Diagnosis not present

## 2023-09-25 DIAGNOSIS — Z853 Personal history of malignant neoplasm of breast: Secondary | ICD-10-CM | POA: Diagnosis not present

## 2023-09-25 DIAGNOSIS — E43 Unspecified severe protein-calorie malnutrition: Secondary | ICD-10-CM | POA: Diagnosis not present

## 2023-09-25 DIAGNOSIS — Z9181 History of falling: Secondary | ICD-10-CM | POA: Diagnosis not present

## 2023-09-25 DIAGNOSIS — K449 Diaphragmatic hernia without obstruction or gangrene: Secondary | ICD-10-CM | POA: Diagnosis not present

## 2023-09-25 DIAGNOSIS — M5126 Other intervertebral disc displacement, lumbar region: Secondary | ICD-10-CM | POA: Diagnosis not present

## 2023-09-25 DIAGNOSIS — F0394 Unspecified dementia, unspecified severity, with anxiety: Secondary | ICD-10-CM | POA: Diagnosis not present

## 2023-09-25 DIAGNOSIS — Z8616 Personal history of COVID-19: Secondary | ICD-10-CM | POA: Diagnosis not present

## 2023-09-25 DIAGNOSIS — M47816 Spondylosis without myelopathy or radiculopathy, lumbar region: Secondary | ICD-10-CM | POA: Diagnosis not present

## 2023-09-25 DIAGNOSIS — R339 Retention of urine, unspecified: Secondary | ICD-10-CM | POA: Diagnosis not present

## 2023-09-27 DIAGNOSIS — R1310 Dysphagia, unspecified: Secondary | ICD-10-CM | POA: Diagnosis not present

## 2023-09-27 DIAGNOSIS — M80042D Age-related osteoporosis with current pathological fracture, left hand, subsequent encounter for fracture with routine healing: Secondary | ICD-10-CM | POA: Diagnosis not present

## 2023-09-27 DIAGNOSIS — F0394 Unspecified dementia, unspecified severity, with anxiety: Secondary | ICD-10-CM | POA: Diagnosis not present

## 2023-09-27 DIAGNOSIS — E43 Unspecified severe protein-calorie malnutrition: Secondary | ICD-10-CM | POA: Diagnosis not present

## 2023-09-27 DIAGNOSIS — F419 Anxiety disorder, unspecified: Secondary | ICD-10-CM | POA: Diagnosis not present

## 2023-09-27 DIAGNOSIS — I10 Essential (primary) hypertension: Secondary | ICD-10-CM | POA: Diagnosis not present

## 2023-09-28 DIAGNOSIS — R1310 Dysphagia, unspecified: Secondary | ICD-10-CM | POA: Diagnosis not present

## 2023-09-28 DIAGNOSIS — E43 Unspecified severe protein-calorie malnutrition: Secondary | ICD-10-CM | POA: Diagnosis not present

## 2023-09-28 DIAGNOSIS — F0394 Unspecified dementia, unspecified severity, with anxiety: Secondary | ICD-10-CM | POA: Diagnosis not present

## 2023-09-28 DIAGNOSIS — M80042D Age-related osteoporosis with current pathological fracture, left hand, subsequent encounter for fracture with routine healing: Secondary | ICD-10-CM | POA: Diagnosis not present

## 2023-09-28 DIAGNOSIS — F419 Anxiety disorder, unspecified: Secondary | ICD-10-CM | POA: Diagnosis not present

## 2023-09-28 DIAGNOSIS — I10 Essential (primary) hypertension: Secondary | ICD-10-CM | POA: Diagnosis not present

## 2023-10-01 DIAGNOSIS — F0394 Unspecified dementia, unspecified severity, with anxiety: Secondary | ICD-10-CM | POA: Diagnosis not present

## 2023-10-01 DIAGNOSIS — M80042D Age-related osteoporosis with current pathological fracture, left hand, subsequent encounter for fracture with routine healing: Secondary | ICD-10-CM | POA: Diagnosis not present

## 2023-10-01 DIAGNOSIS — E43 Unspecified severe protein-calorie malnutrition: Secondary | ICD-10-CM | POA: Diagnosis not present

## 2023-10-01 DIAGNOSIS — R1310 Dysphagia, unspecified: Secondary | ICD-10-CM | POA: Diagnosis not present

## 2023-10-01 DIAGNOSIS — F419 Anxiety disorder, unspecified: Secondary | ICD-10-CM | POA: Diagnosis not present

## 2023-10-01 DIAGNOSIS — I10 Essential (primary) hypertension: Secondary | ICD-10-CM | POA: Diagnosis not present

## 2023-10-05 DIAGNOSIS — F419 Anxiety disorder, unspecified: Secondary | ICD-10-CM | POA: Diagnosis not present

## 2023-10-05 DIAGNOSIS — R1310 Dysphagia, unspecified: Secondary | ICD-10-CM | POA: Diagnosis not present

## 2023-10-05 DIAGNOSIS — E43 Unspecified severe protein-calorie malnutrition: Secondary | ICD-10-CM | POA: Diagnosis not present

## 2023-10-05 DIAGNOSIS — M80042D Age-related osteoporosis with current pathological fracture, left hand, subsequent encounter for fracture with routine healing: Secondary | ICD-10-CM | POA: Diagnosis not present

## 2023-10-05 DIAGNOSIS — F0394 Unspecified dementia, unspecified severity, with anxiety: Secondary | ICD-10-CM | POA: Diagnosis not present

## 2023-10-05 DIAGNOSIS — I10 Essential (primary) hypertension: Secondary | ICD-10-CM | POA: Diagnosis not present

## 2023-10-07 DIAGNOSIS — E43 Unspecified severe protein-calorie malnutrition: Secondary | ICD-10-CM | POA: Diagnosis not present

## 2023-10-07 DIAGNOSIS — R1310 Dysphagia, unspecified: Secondary | ICD-10-CM | POA: Diagnosis not present

## 2023-10-07 DIAGNOSIS — M80042D Age-related osteoporosis with current pathological fracture, left hand, subsequent encounter for fracture with routine healing: Secondary | ICD-10-CM | POA: Diagnosis not present

## 2023-10-07 DIAGNOSIS — F0394 Unspecified dementia, unspecified severity, with anxiety: Secondary | ICD-10-CM | POA: Diagnosis not present

## 2023-10-07 DIAGNOSIS — I10 Essential (primary) hypertension: Secondary | ICD-10-CM | POA: Diagnosis not present

## 2023-10-07 DIAGNOSIS — F419 Anxiety disorder, unspecified: Secondary | ICD-10-CM | POA: Diagnosis not present

## 2023-10-08 DIAGNOSIS — R1310 Dysphagia, unspecified: Secondary | ICD-10-CM | POA: Diagnosis not present

## 2023-10-08 DIAGNOSIS — M80042D Age-related osteoporosis with current pathological fracture, left hand, subsequent encounter for fracture with routine healing: Secondary | ICD-10-CM | POA: Diagnosis not present

## 2023-10-08 DIAGNOSIS — E43 Unspecified severe protein-calorie malnutrition: Secondary | ICD-10-CM | POA: Diagnosis not present

## 2023-10-08 DIAGNOSIS — F419 Anxiety disorder, unspecified: Secondary | ICD-10-CM | POA: Diagnosis not present

## 2023-10-08 DIAGNOSIS — F0394 Unspecified dementia, unspecified severity, with anxiety: Secondary | ICD-10-CM | POA: Diagnosis not present

## 2023-10-08 DIAGNOSIS — I10 Essential (primary) hypertension: Secondary | ICD-10-CM | POA: Diagnosis not present

## 2023-10-11 ENCOUNTER — Emergency Department (HOSPITAL_COMMUNITY)
Admission: EM | Admit: 2023-10-11 | Discharge: 2023-10-12 | Disposition: A | Discharge status: Home / Self Care | Attending: Emergency Medicine | Admitting: Emergency Medicine

## 2023-10-11 ENCOUNTER — Emergency Department (HOSPITAL_COMMUNITY)

## 2023-10-11 DIAGNOSIS — R29818 Other symptoms and signs involving the nervous system: Secondary | ICD-10-CM | POA: Diagnosis not present

## 2023-10-11 DIAGNOSIS — Z79899 Other long term (current) drug therapy: Secondary | ICD-10-CM | POA: Diagnosis not present

## 2023-10-11 DIAGNOSIS — R9389 Abnormal findings on diagnostic imaging of other specified body structures: Secondary | ICD-10-CM | POA: Diagnosis not present

## 2023-10-11 DIAGNOSIS — R464 Slowness and poor responsiveness: Secondary | ICD-10-CM | POA: Insufficient documentation

## 2023-10-11 DIAGNOSIS — Z853 Personal history of malignant neoplasm of breast: Secondary | ICD-10-CM | POA: Diagnosis not present

## 2023-10-11 DIAGNOSIS — R4182 Altered mental status, unspecified: Secondary | ICD-10-CM | POA: Diagnosis not present

## 2023-10-11 DIAGNOSIS — F0390 Unspecified dementia without behavioral disturbance: Secondary | ICD-10-CM | POA: Diagnosis not present

## 2023-10-11 DIAGNOSIS — R0902 Hypoxemia: Secondary | ICD-10-CM | POA: Diagnosis not present

## 2023-10-11 DIAGNOSIS — R55 Syncope and collapse: Secondary | ICD-10-CM | POA: Diagnosis not present

## 2023-10-11 DIAGNOSIS — I10 Essential (primary) hypertension: Secondary | ICD-10-CM | POA: Insufficient documentation

## 2023-10-11 DIAGNOSIS — E7220 Disorder of urea cycle metabolism, unspecified: Secondary | ICD-10-CM | POA: Insufficient documentation

## 2023-10-11 DIAGNOSIS — R531 Weakness: Secondary | ICD-10-CM | POA: Diagnosis not present

## 2023-10-11 DIAGNOSIS — I959 Hypotension, unspecified: Secondary | ICD-10-CM | POA: Diagnosis not present

## 2023-10-11 DIAGNOSIS — I700 Atherosclerosis of aorta: Secondary | ICD-10-CM | POA: Diagnosis not present

## 2023-10-11 DIAGNOSIS — Z7901 Long term (current) use of anticoagulants: Secondary | ICD-10-CM | POA: Insufficient documentation

## 2023-10-11 DIAGNOSIS — G319 Degenerative disease of nervous system, unspecified: Secondary | ICD-10-CM | POA: Diagnosis not present

## 2023-10-11 DIAGNOSIS — I771 Stricture of artery: Secondary | ICD-10-CM | POA: Diagnosis not present

## 2023-10-11 DIAGNOSIS — I639 Cerebral infarction, unspecified: Secondary | ICD-10-CM | POA: Diagnosis not present

## 2023-10-11 DIAGNOSIS — R404 Transient alteration of awareness: Secondary | ICD-10-CM

## 2023-10-11 DIAGNOSIS — I6782 Cerebral ischemia: Secondary | ICD-10-CM | POA: Diagnosis not present

## 2023-10-11 LAB — I-STAT CHEM 8, ED
BUN: 14 mg/dL (ref 8–23)
Calcium, Ion: 1.18 mmol/L (ref 1.15–1.40)
Chloride: 103 mmol/L (ref 98–111)
Creatinine, Ser: 0.8 mg/dL (ref 0.44–1.00)
Glucose, Bld: 96 mg/dL (ref 70–99)
HCT: 39 % (ref 36.0–46.0)
Hemoglobin: 13.3 g/dL (ref 12.0–15.0)
Potassium: 4.1 mmol/L (ref 3.5–5.1)
Sodium: 140 mmol/L (ref 135–145)
TCO2: 25 mmol/L (ref 22–32)

## 2023-10-11 LAB — DIFFERENTIAL
Abs Immature Granulocytes: 0.04 10*3/uL (ref 0.00–0.07)
Basophils Absolute: 0.1 10*3/uL (ref 0.0–0.1)
Basophils Relative: 1 %
Eosinophils Absolute: 0.2 10*3/uL (ref 0.0–0.5)
Eosinophils Relative: 2 %
Immature Granulocytes: 0 %
Lymphocytes Relative: 16 %
Lymphs Abs: 1.6 10*3/uL (ref 0.7–4.0)
Monocytes Absolute: 0.7 10*3/uL (ref 0.1–1.0)
Monocytes Relative: 8 %
Neutro Abs: 6.9 10*3/uL (ref 1.7–7.7)
Neutrophils Relative %: 73 %

## 2023-10-11 LAB — CBC
HCT: 40.7 % (ref 36.0–46.0)
Hemoglobin: 12.9 g/dL (ref 12.0–15.0)
MCH: 29.1 pg (ref 26.0–34.0)
MCHC: 31.7 g/dL (ref 30.0–36.0)
MCV: 91.7 fL (ref 80.0–100.0)
Platelets: 221 10*3/uL (ref 150–400)
RBC: 4.44 MIL/uL (ref 3.87–5.11)
RDW: 13.9 % (ref 11.5–15.5)
WBC: 9.5 10*3/uL (ref 4.0–10.5)
nRBC: 0 % (ref 0.0–0.2)

## 2023-10-11 LAB — URINALYSIS, W/ REFLEX TO CULTURE (INFECTION SUSPECTED)
Bacteria, UA: NONE SEEN
Bilirubin Urine: NEGATIVE
Glucose, UA: NEGATIVE mg/dL
Hgb urine dipstick: NEGATIVE
Ketones, ur: NEGATIVE mg/dL
Leukocytes,Ua: NEGATIVE
Nitrite: NEGATIVE
Protein, ur: NEGATIVE mg/dL
Specific Gravity, Urine: 1.014 (ref 1.005–1.030)
pH: 6 (ref 5.0–8.0)

## 2023-10-11 LAB — COMPREHENSIVE METABOLIC PANEL WITH GFR
ALT: 9 U/L (ref 0–44)
AST: 13 U/L — ABNORMAL LOW (ref 15–41)
Albumin: 3.1 g/dL — ABNORMAL LOW (ref 3.5–5.0)
Alkaline Phosphatase: 84 U/L (ref 38–126)
Anion gap: 8 (ref 5–15)
BUN: 12 mg/dL (ref 8–23)
CO2: 27 mmol/L (ref 22–32)
Calcium: 8.6 mg/dL — ABNORMAL LOW (ref 8.9–10.3)
Chloride: 105 mmol/L (ref 98–111)
Creatinine, Ser: 0.74 mg/dL (ref 0.44–1.00)
GFR, Estimated: >60 mL/min (ref >60)
Glucose, Bld: 96 mg/dL (ref 70–99)
Potassium: 4.1 mmol/L (ref 3.5–5.1)
Sodium: 140 mmol/L (ref 135–145)
Total Bilirubin: 0.4 mg/dL (ref 0.0–1.2)
Total Protein: 5.8 g/dL — ABNORMAL LOW (ref 6.5–8.1)

## 2023-10-11 LAB — D-DIMER, QUANTITATIVE: D-Dimer, Quant: 0.51 ug{FEU}/mL — ABNORMAL HIGH (ref 0.00–0.50)

## 2023-10-11 LAB — PROTIME-INR
INR: 1.2 (ref 0.8–1.2)
Prothrombin Time: 15.2 s (ref 11.4–15.2)

## 2023-10-11 LAB — TROPONIN I (HIGH SENSITIVITY)
Troponin I (High Sensitivity): 5 ng/L (ref <18)
Troponin I (High Sensitivity): 4 ng/L (ref <18)

## 2023-10-11 LAB — APTT: aPTT: 32 s (ref 24–36)

## 2023-10-11 LAB — ETHANOL: Alcohol, Ethyl (B): <15 mg/dL (ref <15)

## 2023-10-11 LAB — CBG MONITORING, ED: Glucose-Capillary: 101 mg/dL — ABNORMAL HIGH (ref 70–99)

## 2023-10-11 MED ORDER — SODIUM CHLORIDE 0.9% FLUSH
3.0000 mL | Freq: Once | INTRAVENOUS | Status: AC
  Administered 2023-10-11: 3 mL via INTRAVENOUS

## 2023-10-11 NOTE — ED Notes (Signed)
 Pt back from MRI

## 2023-10-11 NOTE — ED Notes (Signed)
 Pt to MRI

## 2023-10-11 NOTE — ED Provider Notes (Signed)
 [place] EMERGENCY DEPARTMENT AT [facility] Provider Note   CSN: [id number] Arrival date & time: 10/11/23  1614  An emergency department physician performed an initial assessment on this suspected stroke patient at 1617.  History  Chief Complaint  Patient presents with   Code Stroke    Nancy Bell is a 89 y.o. female.  HPI     89yo female with history of hypertension, hyperlipidemia, right sided breast cancer, history of DVT bilateral, fall in April with wedge compression T3 and hand fracture, dementia, who presents as a Code Stroke with concern for syncopal or unresponsive episode.   Per daughter, she found her slumped over in the wheelchair Is at [facility], used to be on eliquis but is not now and not sure why ?frequent falls. Family reports she does not have great appetite at baseline, does not eat a lot.    Today daughter went to see her and found her around 3PM slumped in her chair-not sure how long she was like that before she arrived, she attempted to wake her and was not able to and called for help. She remained out of it until after the ambulance arrived. EMS thought she may have focal deficit and called code stroke however no noted deficits on arrival with neurology.  She has not had other chest pain, dyspnea, cough, abdominal pain, headaches or other concerns.  She seems at baseline now per family at bedside.  She denies any concerns but history is limited from her based on her dementia.      Past Medical History:  Diagnosis Date   Anxiety    Cancer (HCC)    right breast   History of hiatal hernia    Hyperlipidemia    Hypertension      Home Medications Prior to Admission medications   Medication Sig Start Date End Date Taking? Authorizing Provider  dextromethorphan (DELSYM) 30 MG/5ML liquid TAKE 10MLS EVERY 12HRS FOR 7 DAYS 05/29/23  Yes [provider]  divalproex (DEPAKOTE SPRINKLE) 125 MG capsule Take 125 mg by mouth 3 (three)  times daily. 10/03/23  Yes [provider]  DULoxetine (CYMBALTA) 60 MG capsule Take 60 mg by mouth daily. 09/28/23  Yes [provider]  ELIQUIS 5 MG TABS tablet Take 5 mg by mouth 2 (two) times daily. 09/24/23  Yes [provider]  escitalopram (LEXAPRO) 5 MG tablet Take 5 mg by mouth daily. 07/30/23  Yes [provider]  ibuprofen  (ADVIL ) 100 MG/5ML suspension Take by mouth. 08/02/23  Yes [provider]  loperamide  (IMODIUM ) 2 MG capsule Take by mouth. 05/14/23  Yes [provider]  mirtazapine (REMERON) 7.5 MG tablet Take 7.5 mg by mouth at bedtime. 09/28/23  Yes [provider]  nystatin (MYCOSTATIN) 100000 UNIT/ML suspension  07/31/23  Yes [provider]  ondansetron  (ZOFRAN -ODT) 4 MG disintegrating tablet Take by mouth. 08/03/23  Yes [provider]  oxyCODONE  (OXY IR/ROXICODONE ) 5 MG immediate release tablet Take by mouth. 08/15/23  Yes [provider]  potassium chloride  SA (KLOR-CON  M) 20 MEQ tablet Take 20 mEq by mouth 2 (two) times daily. 09/28/23  Yes [provider]  tamsulosin  (FLOMAX ) 0.4 MG CAPS capsule Take by mouth. 09/28/23  Yes [provider]  torsemide (DEMADEX) 10 MG tablet Take 10 mg by mouth daily. 07/21/23  Yes [provider]  triamcinolone cream (KENALOG) 0.5 % Apply topically. 06/12/23  Yes [provider]  ALPRAZolam  (XANAX ) 0.25 MG tablet Take 0.125 mg  by mouth 2 (two) times daily. 01/23/15   [provider]  Coenzyme Q10 (CO Q 10 PO) Take 1 capsule by mouth daily.    [provider]  lovastatin (MEVACOR) 40 MG tablet Take 40 mg by mouth daily. 01/31/15   [provider]  naproxen sodium (ALEVE) 220 MG tablet Take 220 mg by mouth daily as needed (pain).    [provider]  omeprazole (PRILOSEC) 20 MG capsule Take 20 mg by mouth daily.    [provider]  Potassium Chloride  ER 20 MEQ TBCR Take 20 mEq by mouth 2  (two) times daily. 03/10/23   [provider]      Allergies    Augmentin [amoxicillin-pot clavulanate], Vicodin [hydrocodone-acetaminophen ], Oxycontin  [oxycodone  hcl], Tylenol  [acetaminophen ], and Ultram  [tramadol  hcl]    Review of Systems   Review of Systems  Physical Exam Updated Vital Signs BP 121/82   Pulse 85   Temp 97.9 F (36.6 C)   Resp (!) 25   Wt 53.4 kg   SpO2 100%   BMI 21.53 kg/m  Physical Exam Vitals and nursing note reviewed.  Constitutional:      General: She is not in acute distress.    Appearance: She is well-developed. She is not diaphoretic.  HENT:     Head: Normocephalic and atraumatic.  Eyes:     Conjunctiva/sclera: Conjunctivae normal.  Cardiovascular:     Rate and Rhythm: Normal rate and regular rhythm.     Heart sounds: Normal heart sounds. No murmur heard.    No friction rub. No gallop.  Pulmonary:     Effort: Pulmonary effort is normal. No respiratory distress.     Breath sounds: Normal breath sounds. No wheezing or rales.  Abdominal:     General: There is no distension.     Palpations: Abdomen is soft.     Tenderness: There is no abdominal tenderness. There is no guarding.  Musculoskeletal:        General: No tenderness.     Cervical back: Normal range of motion.  Skin:    General: Skin is warm and dry.     Findings: No erythema or rash.     Comments: Skin tears x2 RLE, older, no sign of infection, skin tear left lower leg 1cm  Neurological:     Mental Status: She is alert.     Cranial Nerves: No cranial nerve deficit.     Sensory: No sensory deficit.     Motor: No weakness.     Comments: No clear CN deficits, exam limited somewhat by noted dementia, following commands for visual fields, finger to nose would touch nose repeatedly, or touch finger, was able to name thumb, repeat no ifs ands or buts, normal EOM, symmetric facies, strength appears symmetric bilateral UE Knows in hospital not exact location, knows name, not date      ED Results / Procedures / Treatments   Labs (all labs ordered are listed, but only abnormal results are displayed) Labs Reviewed  COMPREHENSIVE METABOLIC PANEL WITH GFR - Abnormal; Notable for the following components:      Result Value   Calcium 8.6 (*)    Total Protein 5.8 (*)    Albumin 3.1 (*)    AST 13 (*)    All other components within normal limits  D-DIMER, QUANTITATIVE - Abnormal; Notable for the following components:   D-Dimer, Quant 0.51 (*)    All other components within normal limits  CBG MONITORING, ED - Abnormal; Notable  for the following components:   Glucose-Capillary 101 (*)    All other components within normal limits  PROTIME-INR  APTT  CBC  DIFFERENTIAL  ETHANOL  URINALYSIS, W/ REFLEX TO CULTURE (INFECTION SUSPECTED)  I-STAT CHEM 8, ED  TROPONIN I (HIGH SENSITIVITY)  TROPONIN I (HIGH SENSITIVITY)    EKG None  Radiology MR BRAIN WO CONTRAST Result Date: 10/11/2023 CLINICAL DATA:  Stroke, follow up EXAM: MRI HEAD WITHOUT CONTRAST TECHNIQUE: Multiplanar, multiecho pulse sequences of the brain and surrounding structures were obtained without intravenous contrast. COMPARISON:  CT head from earlier today. FINDINGS: Brain: No acute infarction, hemorrhage, hydrocephalus, extra-axial collection or mass lesion. Patchy T2/FLAIR hyperintensity white matter, compatible with chronic microvascular disease. Cerebral atrophy. Vascular: Major arterial flow voids are maintained at the skull base. T2 hyperintensity in the left sigmoid sinus, most likely slow flow. Skull and upper cervical spine: Normal marrow signal. Sinuses/Orbits: Clear sinuses.  No acute findings. IMPRESSION: No evidence of acute intracranial abnormality. Electronically Signed   By: Stevenson Elbe M.D.   On: 10/11/2023 23:24   DG Chest Portable 1 View Result Date: 10/11/2023 CLINICAL DATA:  syncope EXAM: PORTABLE CHEST - 1 VIEW COMPARISON:  August 14, 2023 FINDINGS: Elevation of the right hemidiaphragm.  No focal airspace consolidation, pleural effusion, or pneumothorax. No cardiomegaly. Tortuous aorta with aortic atherosclerosis. No acute fracture or destructive lesion. Multilevel thoracic osteophytosis. Osteopenia. Left axillary/breast surgical clips again noted. IMPRESSION: No acute cardiopulmonary abnormality. Electronically Signed   By: Rance Burrows M.D.   On: 10/11/2023 18:20   CT HEAD CODE STROKE WO CONTRAST Result Date: 10/11/2023 CLINICAL DATA:  Code stroke.  Neuro deficit, concern for stroke. EXAM: CT HEAD WITHOUT CONTRAST TECHNIQUE: Contiguous axial images were obtained from the base of the skull through the vertex without intravenous contrast. RADIATION DOSE REDUCTION: This exam was performed according to the departmental dose-optimization program which includes automated exposure control, adjustment of the mA and/or kV according to patient size and/or use of iterative reconstruction technique. COMPARISON:  08/14/2023 FINDINGS: Brain: No acute intracranial hemorrhage. No CT evidence of acute infarct. Nonspecific hypoattenuation in the periventricular and subcortical white matter favored to reflect chronic microvascular ischemic changes. Mild parenchymal volume loss. No edema, mass effect, or midline shift. The basilar cisterns are patent. Ventricles: The ventricles are normal. Vascular: Atherosclerotic calcifications of the carotid siphons. No hyperdense vessel. Skull: No acute or aggressive finding. Orbits: Orbits are symmetric. Sinuses: Mucosal thickening in the right maxillary sinus. Other: Mastoid air cells are clear. ASPECTS [facility] Stroke Program Early CT Score) - Ganglionic level infarction (caudate, lentiform nuclei, internal capsule, insula, M1-M3 cortex): 7 - Supraganglionic infarction (M4-M6 cortex): 3 Total score (0-10 with 10 being normal): 10 IMPRESSION: 1. No CT evidence of acute intracranial abnormality. 2. Chronic microvascular ischemic changes. Mild parenchymal volume loss. 3.  ASPECTS is 10 These results were communicated to Dr. Doretta Gant At 4:43 pm on 10/11/2023 by text page via the [facility] messaging system. Electronically Signed   By: Denny Flack M.D.   On: 10/11/2023 16:43    Procedures Procedures    Medications Ordered in ED Medications  sodium chloride  flush (NS) 0.9 % injection 3 mL (3 mLs Intravenous Given 10/11/23 2306)    ED Course/ Medical Decision Making/ A&P                                   89yo female with history of hypertension, hyperlipidemia, right sided  breast cancer, history of DVT bilateral, fall in April with wedge compression T3 and hand fracture, dementia, who presents as a Code Stroke with concern for syncopal or unresponsive episode.   DDx includes anemia, electrolyte abnormalities, seizure with post-ictal state, encephalopathy secondary to infection, medication, CVA, cardiac arrhythmia, PE, ACS.   ECG was evaluated with normal sinus rhythm although is not visible in the computer.  Telemetry monitoring during time in ED also shows sinus rhythm with normal rate.  CXR without pneumonia nor pulmonary edema.  Labs completed and show normal troponin, doubt ACS, ddimer is age-adjusted WNL and low suspicion for PE, negative etoh, no clinically significant electrolyte abnormalities, anemia or leukocytosis, INR ordered as a part of code stroke order set is also WNL, UA without infection.  She remains at baseline during ED stay--doubt hyperammonemia.  CT head CODE STROKE was evaluated by neurology and radiology showed no evidence of acute abnormalities.  MRI ordered given question of focal deficits en route evaluated by me and radiology shows no evidence of CVA.   She remains hemodynamically stable and at baseline in the ED. Discussed could consider admission for syncope observation but it is not unreasonable to follow up as an outpatient.  She will follow up outpatient, continue to encourage po. Patient discharged in stable condition with  understanding of reasons to return.        Final Clinical Impression(s) / ED Diagnoses Final diagnoses:  Unresponsive episode    Rx / DC Orders ED Discharge Orders     None         Scarlette Currier, MD 10/12/23 1055

## 2023-10-11 NOTE — Code Documentation (Signed)
 Stroke Response Nurse Documentation Code Documentation  Nancy Bell is a 89 y.o. female arriving to [facility]  via [facility] on 6/2 with past medical hx of htn, hld, breast CA, DVT, dementia. On No antithrombotic. Code stroke was activated by EMS.   Patient from [facility] where she was LKW at 1330 and was then found slumped over in her wheelchair minimally responsive.    Stroke team at the bedside on patient arrival. Labs drawn and patient cleared for CT by EDP. Patient to CT with team. NIHSS 4, see documentation for details and code stroke times. Patient with bilateral leg weakness on exam. The following imaging was completed:  CT Head. Patient is not a candidate for IV Thrombolytic due to stroke not suspected. Patient is not a candidate for IR due to LVO not suspected.   Care Plan: q2 NIHSS and vitals (patient back to baseline).   Bedside handoff with ED RN Odilia Bennett.    Marlon Simpson  Stroke Response RN

## 2023-10-11 NOTE — Consult Note (Signed)
 NEUROLOGY CONSULT NOTE   Date of service: October 11, 2023 Patient Name: Nancy Bell MRN:  [id number] DOB:  28-Mar-1925 Chief Complaint: "CODE STROKE" Requesting Provider: Scarlette Currier, MD  History of Present Illness  Nancy Bell is a 89 y.o. female with hx of dementia, HTN, HLD, Breast Cancer who was BIB EMS as a CODE STROKE due to aphasia and right-sided weakness.  Per EMS, patient was in her normal state after lunch at 1330 when checked on, oer facility staff. At 1500, she was seen in her wheelchair slumped over and not responding. I spoke with Bartholomew Light, the patient's daughter, and she stated that the patient's oxygen saturation was in the 80s at the time, her HR was increased.  When patient arrived at bridge, EMS stated that she was much more interactive than she was with them. She had no aphasia or right-sided weakness present on neurological exam at bridge. Patient back to her baseline with bilateral leg weakness present.  CTH negative for acute process.   LKW: 1330 Modified rankin score: 4-5 IV Thrombolysis:no, back to baseline  EVT: No, No LVO suspected  NIHSS components Score: Comment  1a Level of Conscious 0[x]  1[]  2[]  3[]      1b LOC Questions 0[x]  1[]  2[]       1c LOC Commands 0[x]  1[]  2[]       2 Best Gaze 0[x]  1[]  2[]       3 Visual 0[x]  1[]  2[]  3[]      4 Facial Palsy 0[x]  1[]  2[]  3[]      5a Motor Arm - left 0[x]  1[]  2[]  3[]  4[]  UN[]    5b Motor Arm - Right 0[x]  1[]  2[]  3[]  4[]  UN[]    6a Motor Leg - Left 0[]  1[]  2[x]  3[]  4[]  UN[]   Wc bound at baseline  6b Motor Leg - Right 0[]  1[]  2[x]  3[]  4[]  UN[]   Wc bound at baseline  7 Limb Ataxia 0[x]  1[]  2[]  UN[]     Unable to perform HKS  8 Sensory 0[x]  1[]  2[]  UN[]      9 Best Language 0[x]  1[]  2[]  3[]      10 Dysarthria 0[x]  1[]  2[]  UN[]      11 Extinct. and Inattention 0[x]  1[]  2[]       TOTAL:   2      ROS  Comprehensive ROS performed and pertinent positives documented in HPI   Past History   Past Medical History:   Diagnosis Date   Anxiety    Cancer (HCC)    right breast   History of hiatal hernia    Hyperlipidemia    Hypertension     Past Surgical History:  Procedure Laterality Date   BREAST BIOPSY  2 weeks ago   BREAST LUMPECTOMY  01/29/2010   Right- Dr Linell Rhymes   TOOTH EXTRACTION     TOTAL MASTECTOMY Left 09/30/2020   Procedure: LEFT TOTAL MASTECTOMY;  Surgeon: Caralyn Chandler, MD;  Location: [facility] OR;  Service: General;  Laterality: Left;    Family History: Family History  Problem Relation Age of Onset   Cancer Father        kidney   Cancer Son 9       testicular    Social History  reports that she has never smoked. She has never used smokeless tobacco. She reports that she does not drink alcohol and does not use drugs.  Allergies  Allergen Reactions   Augmentin [Amoxicillin-Pot Clavulanate] Other (See Comments)    Develops C Diff   Vicodin [Hydrocodone-Acetaminophen ] Other (See Comments)  Insomnia    Oxycontin  [Oxycodone  Hcl] Other (See Comments)    Unknown reaction   Tylenol  [Acetaminophen ] Nausea And Vomiting    Chills    Ultram  [Tramadol  Hcl] Other (See Comments)    Unknown reaction    Medications   Current Facility-Administered Medications:    sodium chloride  flush (NS) 0.9 % injection 3 mL, 3 mL, Intravenous, Once, Scarlette Currier, MD  Current Outpatient Medications:    ALPRAZolam  (XANAX ) 0.25 MG tablet, Take 0.125 mg by mouth 2 (two) times daily., Disp: , Rfl: 2   Coenzyme Q10 (CO Q 10 PO), Take 1 capsule by mouth daily., Disp: , Rfl:    loperamide  (IMODIUM  A-D) 2 MG tablet, Take 1 tablet (2 mg total) by mouth 3 (three) times daily as needed for diarrhea or loose stools., Disp: 10 tablet, Rfl: 0   lovastatin (MEVACOR) 40 MG tablet, Take 40 mg by mouth daily., Disp: , Rfl:    naproxen sodium (ALEVE) 220 MG tablet, Take 220 mg by mouth daily as needed (pain)., Disp: , Rfl:    omeprazole (PRILOSEC) 20 MG capsule, Take 20 mg by mouth daily., Disp: , Rfl:     Potassium Chloride  ER 20 MEQ TBCR, Take 20 mEq by mouth 2 (two) times daily., Disp: , Rfl:   Vitals   Vitals:   10/11/23 1600 10/11/23 1623  BP:  128/63  Pulse:  87  Resp:  16  Temp:  98.5 F (36.9 C)  TempSrc:  Oral  SpO2:  99%  Weight: 53.4 kg     Body mass index is 21.53 kg/m.  Physical Exam   Constitutional: Appears elderly Cardiovascular: Normal rate and regular rhythm.  Respiratory: Effort normal, non-labored breathing.   Neurologic Examination   Neuro: Mental Status: Patient is awake, alert, oriented to person, age and month No signs of dysarthria, aphasia or neglect Cranial Nerves: II: Visual Fields are full. Pupils are equal, round, and reactive to light.   III,IV, VI: EOMI without ptosis or diploplia.  V: Facial sensation is symmetric to light touch VII: Facial movement is symmetric.  VIII: hearing is intact to voice X: No dysarthria XI: Shoulder shrug is symmetric. XII: tongue is midline  Motor: Tone is normal. Bulk is normal.  BLE: 3/5, wheelchair bound at baseline. Able to bend at knee and hold there for a few seconds.  Sensory: Sensation is symmetric to light touch in the arms and legs. Cerebellar: FNF intact bilaterally   Labs/Imaging/Neurodiagnostic studies   CBC: No results for input(s): "WBC", "NEUTROABS", "HGB", "HCT", "MCV", "PLT" in the last 168 hours. Basic Metabolic Panel:  Lab Results  Component Value Date   NA 142 08/14/2023   K 4.0 08/14/2023   CO2 22 08/14/2023   GLUCOSE 120 (H) 08/14/2023   BUN 13 08/14/2023   CREATININE 0.91 08/14/2023   CALCIUM 8.4 (L) 08/14/2023   GFRNONAA 58 (L) 08/14/2023   GFRAA 57 (L) 02/15/2018   Lipid Panel: No results found for: "LDLCALC" HgbA1c: No results found for: "HGBA1C" Urine Drug Screen: No results found for: "LABOPIA", "COCAINSCRNUR", "LABBENZ", "AMPHETMU", "THCU", "LABBARB"  Alcohol Level No results found for: "ETH" INR No results found for: "INR" APTT No results found for:  "APTT" AED levels: No results found for: "PHENYTOIN", "ZONISAMIDE", "LAMOTRIGINE", "LEVETIRACETA"  CT Head without contrast(Personally reviewed): No CT evidence of acute intracranial abnormality. Chronic microvascular ischemic changes. Mild parenchymal volume loss. ASPECTS is 10   ASSESSMENT   LAKEDRA WASHINGTON is a 89 y.o. female with hx of dementia,  HTN, HLD, Breast Cancer who was BIB EMS as a CODE STROKE due to aphasia and right-sided weakness.  Per EMS, patient was in her normal state after lunch at 1330 when checked on, oer facility staff. At 1500, she was seen in her wheelchair slumped over and not responding, patient's oxygen saturation was in the 80s at the time, her HR was increased.  When patient arrived at bridge, she had no aphasia or right-sided weakness present on neurological exam at bridge. Patient back to her baseline with bilateral leg weakness present.  CTH negative for acute process.    RECOMMENDATIONS   - Q2H NIH - STAT CT with acute neuro change - MRI brain wo contrast, if (-) for stroke no further neurologic workup - Further workup per EDP/Primary. Suspect episode 2/2 hypoxic event ______________________________________________________________________    Cherl Corner, NP [facility]    Attending Neurohospitalist Addendum Patient seen and examined with APP/Resident. Agree with the history and physical as documented above. Agree with the plan as documented, which I helped formulate. I have edited the note above to reflect my full findings and recommendations. I have independently reviewed the chart, obtained history, review of systems and examined the patient.I have personally reviewed pertinent head/neck/spine imaging (CT/MRI). Please feel free to call with any questions.  -- Greg Leaks, MD [facility] [phone]  If 7pm- 7am, please page neurology on call as listed in AMION.

## 2023-10-11 NOTE — ED Triage Notes (Signed)
 Pt to ED via [facility] from [facility], CODE STROKE. Apparently pt was seen by staff at 1330, and taken to the family room, at 3pm, pt daughter found pt unresponsive and slumped over in [facility].  Pt A&O with EMS, HX of dementia. Pt interacting appropriately with this RN.  Last known well 1330, unclear is pt is taking Eliquis.  #18LAC.  Last VS: 152/74, hr90, 95%RA, CBG 108

## 2023-10-12 DIAGNOSIS — F0394 Unspecified dementia, unspecified severity, with anxiety: Secondary | ICD-10-CM | POA: Diagnosis not present

## 2023-10-12 DIAGNOSIS — G459 Transient cerebral ischemic attack, unspecified: Secondary | ICD-10-CM | POA: Diagnosis not present

## 2023-10-12 DIAGNOSIS — R1310 Dysphagia, unspecified: Secondary | ICD-10-CM | POA: Diagnosis not present

## 2023-10-12 DIAGNOSIS — M80042D Age-related osteoporosis with current pathological fracture, left hand, subsequent encounter for fracture with routine healing: Secondary | ICD-10-CM | POA: Diagnosis not present

## 2023-10-12 DIAGNOSIS — F419 Anxiety disorder, unspecified: Secondary | ICD-10-CM | POA: Diagnosis not present

## 2023-10-12 DIAGNOSIS — I10 Essential (primary) hypertension: Secondary | ICD-10-CM | POA: Diagnosis not present

## 2023-10-12 DIAGNOSIS — E43 Unspecified severe protein-calorie malnutrition: Secondary | ICD-10-CM | POA: Diagnosis not present

## 2023-10-12 DIAGNOSIS — Z743 Need for continuous supervision: Secondary | ICD-10-CM | POA: Diagnosis not present

## 2023-10-12 NOTE — ED Notes (Signed)
 [facility] called for transport to [facility]

## 2023-10-15 DIAGNOSIS — F419 Anxiety disorder, unspecified: Secondary | ICD-10-CM | POA: Diagnosis not present

## 2023-10-15 DIAGNOSIS — R1310 Dysphagia, unspecified: Secondary | ICD-10-CM | POA: Diagnosis not present

## 2023-10-15 DIAGNOSIS — M80042D Age-related osteoporosis with current pathological fracture, left hand, subsequent encounter for fracture with routine healing: Secondary | ICD-10-CM | POA: Diagnosis not present

## 2023-10-15 DIAGNOSIS — F0394 Unspecified dementia, unspecified severity, with anxiety: Secondary | ICD-10-CM | POA: Diagnosis not present

## 2023-10-15 DIAGNOSIS — E43 Unspecified severe protein-calorie malnutrition: Secondary | ICD-10-CM | POA: Diagnosis not present

## 2023-10-15 DIAGNOSIS — I10 Essential (primary) hypertension: Secondary | ICD-10-CM | POA: Diagnosis not present

## 2023-10-19 DIAGNOSIS — E876 Hypokalemia: Secondary | ICD-10-CM | POA: Diagnosis not present

## 2023-10-19 DIAGNOSIS — S62309D Unspecified fracture of unspecified metacarpal bone, subsequent encounter for fracture with routine healing: Secondary | ICD-10-CM | POA: Diagnosis not present

## 2023-10-19 DIAGNOSIS — I82411 Acute embolism and thrombosis of right femoral vein: Secondary | ICD-10-CM | POA: Diagnosis not present

## 2023-10-19 DIAGNOSIS — G252 Other specified forms of tremor: Secondary | ICD-10-CM | POA: Diagnosis not present

## 2023-10-19 DIAGNOSIS — R1310 Dysphagia, unspecified: Secondary | ICD-10-CM | POA: Diagnosis not present

## 2023-10-19 DIAGNOSIS — F419 Anxiety disorder, unspecified: Secondary | ICD-10-CM | POA: Diagnosis not present

## 2023-10-19 DIAGNOSIS — R630 Anorexia: Secondary | ICD-10-CM | POA: Diagnosis not present

## 2023-10-19 DIAGNOSIS — E43 Unspecified severe protein-calorie malnutrition: Secondary | ICD-10-CM | POA: Diagnosis not present

## 2023-10-19 DIAGNOSIS — M80042D Age-related osteoporosis with current pathological fracture, left hand, subsequent encounter for fracture with routine healing: Secondary | ICD-10-CM | POA: Diagnosis not present

## 2023-10-19 DIAGNOSIS — I82403 Acute embolism and thrombosis of unspecified deep veins of lower extremity, bilateral: Secondary | ICD-10-CM | POA: Diagnosis not present

## 2023-10-19 DIAGNOSIS — Z7901 Long term (current) use of anticoagulants: Secondary | ICD-10-CM | POA: Diagnosis not present

## 2023-10-19 DIAGNOSIS — F0394 Unspecified dementia, unspecified severity, with anxiety: Secondary | ICD-10-CM | POA: Diagnosis not present

## 2023-10-19 DIAGNOSIS — I82432 Acute embolism and thrombosis of left popliteal vein: Secondary | ICD-10-CM | POA: Diagnosis not present

## 2023-10-19 DIAGNOSIS — S81809A Unspecified open wound, unspecified lower leg, initial encounter: Secondary | ICD-10-CM | POA: Diagnosis not present

## 2023-10-19 DIAGNOSIS — I10 Essential (primary) hypertension: Secondary | ICD-10-CM | POA: Diagnosis not present

## 2023-10-19 DIAGNOSIS — R55 Syncope and collapse: Secondary | ICD-10-CM | POA: Diagnosis not present

## 2023-10-19 DIAGNOSIS — Z6820 Body mass index (BMI) 20.0-20.9, adult: Secondary | ICD-10-CM | POA: Diagnosis not present

## 2023-10-22 DIAGNOSIS — I10 Essential (primary) hypertension: Secondary | ICD-10-CM | POA: Diagnosis not present

## 2023-10-22 DIAGNOSIS — E43 Unspecified severe protein-calorie malnutrition: Secondary | ICD-10-CM | POA: Diagnosis not present

## 2023-10-22 DIAGNOSIS — F0394 Unspecified dementia, unspecified severity, with anxiety: Secondary | ICD-10-CM | POA: Diagnosis not present

## 2023-10-22 DIAGNOSIS — M80042D Age-related osteoporosis with current pathological fracture, left hand, subsequent encounter for fracture with routine healing: Secondary | ICD-10-CM | POA: Diagnosis not present

## 2023-10-22 DIAGNOSIS — F419 Anxiety disorder, unspecified: Secondary | ICD-10-CM | POA: Diagnosis not present

## 2023-10-22 DIAGNOSIS — R1310 Dysphagia, unspecified: Secondary | ICD-10-CM | POA: Diagnosis not present

## 2023-10-23 DIAGNOSIS — I10 Essential (primary) hypertension: Secondary | ICD-10-CM | POA: Diagnosis not present

## 2023-10-23 DIAGNOSIS — E43 Unspecified severe protein-calorie malnutrition: Secondary | ICD-10-CM | POA: Diagnosis not present

## 2023-10-23 DIAGNOSIS — R1310 Dysphagia, unspecified: Secondary | ICD-10-CM | POA: Diagnosis not present

## 2023-10-23 DIAGNOSIS — M80042D Age-related osteoporosis with current pathological fracture, left hand, subsequent encounter for fracture with routine healing: Secondary | ICD-10-CM | POA: Diagnosis not present

## 2023-10-23 DIAGNOSIS — F0394 Unspecified dementia, unspecified severity, with anxiety: Secondary | ICD-10-CM | POA: Diagnosis not present

## 2023-10-23 DIAGNOSIS — F419 Anxiety disorder, unspecified: Secondary | ICD-10-CM | POA: Diagnosis not present

## 2023-10-25 DIAGNOSIS — Z7901 Long term (current) use of anticoagulants: Secondary | ICD-10-CM | POA: Diagnosis not present

## 2023-10-25 DIAGNOSIS — Z853 Personal history of malignant neoplasm of breast: Secondary | ICD-10-CM | POA: Diagnosis not present

## 2023-10-25 DIAGNOSIS — E785 Hyperlipidemia, unspecified: Secondary | ICD-10-CM | POA: Diagnosis not present

## 2023-10-25 DIAGNOSIS — Z9181 History of falling: Secondary | ICD-10-CM | POA: Diagnosis not present

## 2023-10-25 DIAGNOSIS — K219 Gastro-esophageal reflux disease without esophagitis: Secondary | ICD-10-CM | POA: Diagnosis not present

## 2023-10-25 DIAGNOSIS — F0394 Unspecified dementia, unspecified severity, with anxiety: Secondary | ICD-10-CM | POA: Diagnosis not present

## 2023-10-25 DIAGNOSIS — K449 Diaphragmatic hernia without obstruction or gangrene: Secondary | ICD-10-CM | POA: Diagnosis not present

## 2023-10-25 DIAGNOSIS — E43 Unspecified severe protein-calorie malnutrition: Secondary | ICD-10-CM | POA: Diagnosis not present

## 2023-10-25 DIAGNOSIS — M5126 Other intervertebral disc displacement, lumbar region: Secondary | ICD-10-CM | POA: Diagnosis not present

## 2023-10-25 DIAGNOSIS — I10 Essential (primary) hypertension: Secondary | ICD-10-CM | POA: Diagnosis not present

## 2023-10-25 DIAGNOSIS — Z8616 Personal history of COVID-19: Secondary | ICD-10-CM | POA: Diagnosis not present

## 2023-10-25 DIAGNOSIS — R339 Retention of urine, unspecified: Secondary | ICD-10-CM | POA: Diagnosis not present

## 2023-10-25 DIAGNOSIS — F419 Anxiety disorder, unspecified: Secondary | ICD-10-CM | POA: Diagnosis not present

## 2023-10-25 DIAGNOSIS — M1990 Unspecified osteoarthritis, unspecified site: Secondary | ICD-10-CM | POA: Diagnosis not present

## 2023-10-25 DIAGNOSIS — M80042D Age-related osteoporosis with current pathological fracture, left hand, subsequent encounter for fracture with routine healing: Secondary | ICD-10-CM | POA: Diagnosis not present

## 2023-10-25 DIAGNOSIS — M47816 Spondylosis without myelopathy or radiculopathy, lumbar region: Secondary | ICD-10-CM | POA: Diagnosis not present

## 2023-10-25 DIAGNOSIS — M48061 Spinal stenosis, lumbar region without neurogenic claudication: Secondary | ICD-10-CM | POA: Diagnosis not present

## 2023-10-25 DIAGNOSIS — R1310 Dysphagia, unspecified: Secondary | ICD-10-CM | POA: Diagnosis not present

## 2023-10-25 DIAGNOSIS — Z9012 Acquired absence of left breast and nipple: Secondary | ICD-10-CM | POA: Diagnosis not present

## 2023-10-25 DIAGNOSIS — M2578 Osteophyte, vertebrae: Secondary | ICD-10-CM | POA: Diagnosis not present

## 2023-10-26 DIAGNOSIS — M80042D Age-related osteoporosis with current pathological fracture, left hand, subsequent encounter for fracture with routine healing: Secondary | ICD-10-CM | POA: Diagnosis not present

## 2023-10-26 DIAGNOSIS — F419 Anxiety disorder, unspecified: Secondary | ICD-10-CM | POA: Diagnosis not present

## 2023-10-26 DIAGNOSIS — E43 Unspecified severe protein-calorie malnutrition: Secondary | ICD-10-CM | POA: Diagnosis not present

## 2023-10-26 DIAGNOSIS — F0394 Unspecified dementia, unspecified severity, with anxiety: Secondary | ICD-10-CM | POA: Diagnosis not present

## 2023-10-26 DIAGNOSIS — I10 Essential (primary) hypertension: Secondary | ICD-10-CM | POA: Diagnosis not present

## 2023-10-26 DIAGNOSIS — R1310 Dysphagia, unspecified: Secondary | ICD-10-CM | POA: Diagnosis not present

## 2023-10-27 DIAGNOSIS — F419 Anxiety disorder, unspecified: Secondary | ICD-10-CM | POA: Diagnosis not present

## 2023-10-27 DIAGNOSIS — F0394 Unspecified dementia, unspecified severity, with anxiety: Secondary | ICD-10-CM | POA: Diagnosis not present

## 2023-10-27 DIAGNOSIS — E43 Unspecified severe protein-calorie malnutrition: Secondary | ICD-10-CM | POA: Diagnosis not present

## 2023-10-27 DIAGNOSIS — R1310 Dysphagia, unspecified: Secondary | ICD-10-CM | POA: Diagnosis not present

## 2023-10-27 DIAGNOSIS — I10 Essential (primary) hypertension: Secondary | ICD-10-CM | POA: Diagnosis not present

## 2023-10-27 DIAGNOSIS — M80042D Age-related osteoporosis with current pathological fracture, left hand, subsequent encounter for fracture with routine healing: Secondary | ICD-10-CM | POA: Diagnosis not present

## 2023-10-29 DIAGNOSIS — I10 Essential (primary) hypertension: Secondary | ICD-10-CM | POA: Diagnosis not present

## 2023-10-29 DIAGNOSIS — R1310 Dysphagia, unspecified: Secondary | ICD-10-CM | POA: Diagnosis not present

## 2023-10-29 DIAGNOSIS — F0394 Unspecified dementia, unspecified severity, with anxiety: Secondary | ICD-10-CM | POA: Diagnosis not present

## 2023-10-29 DIAGNOSIS — E43 Unspecified severe protein-calorie malnutrition: Secondary | ICD-10-CM | POA: Diagnosis not present

## 2023-10-29 DIAGNOSIS — M80042D Age-related osteoporosis with current pathological fracture, left hand, subsequent encounter for fracture with routine healing: Secondary | ICD-10-CM | POA: Diagnosis not present

## 2023-10-29 DIAGNOSIS — F419 Anxiety disorder, unspecified: Secondary | ICD-10-CM | POA: Diagnosis not present

## 2023-11-02 DIAGNOSIS — M80042D Age-related osteoporosis with current pathological fracture, left hand, subsequent encounter for fracture with routine healing: Secondary | ICD-10-CM | POA: Diagnosis not present

## 2023-11-02 DIAGNOSIS — I82432 Acute embolism and thrombosis of left popliteal vein: Secondary | ICD-10-CM | POA: Diagnosis not present

## 2023-11-02 DIAGNOSIS — E43 Unspecified severe protein-calorie malnutrition: Secondary | ICD-10-CM | POA: Diagnosis not present

## 2023-11-02 DIAGNOSIS — I10 Essential (primary) hypertension: Secondary | ICD-10-CM | POA: Diagnosis not present

## 2023-11-02 DIAGNOSIS — R300 Dysuria: Secondary | ICD-10-CM | POA: Diagnosis not present

## 2023-11-02 DIAGNOSIS — R52 Pain, unspecified: Secondary | ICD-10-CM | POA: Diagnosis not present

## 2023-11-02 DIAGNOSIS — R55 Syncope and collapse: Secondary | ICD-10-CM | POA: Diagnosis not present

## 2023-11-02 DIAGNOSIS — E876 Hypokalemia: Secondary | ICD-10-CM | POA: Diagnosis not present

## 2023-11-02 DIAGNOSIS — R32 Unspecified urinary incontinence: Secondary | ICD-10-CM | POA: Diagnosis not present

## 2023-11-02 DIAGNOSIS — I82411 Acute embolism and thrombosis of right femoral vein: Secondary | ICD-10-CM | POA: Diagnosis not present

## 2023-11-02 DIAGNOSIS — F0394 Unspecified dementia, unspecified severity, with anxiety: Secondary | ICD-10-CM | POA: Diagnosis not present

## 2023-11-02 DIAGNOSIS — F419 Anxiety disorder, unspecified: Secondary | ICD-10-CM | POA: Diagnosis not present

## 2023-11-02 DIAGNOSIS — G252 Other specified forms of tremor: Secondary | ICD-10-CM | POA: Diagnosis not present

## 2023-11-02 DIAGNOSIS — R630 Anorexia: Secondary | ICD-10-CM | POA: Diagnosis not present

## 2023-11-02 DIAGNOSIS — R410 Disorientation, unspecified: Secondary | ICD-10-CM | POA: Diagnosis not present

## 2023-11-02 DIAGNOSIS — F329 Major depressive disorder, single episode, unspecified: Secondary | ICD-10-CM | POA: Diagnosis not present

## 2023-11-02 DIAGNOSIS — R1310 Dysphagia, unspecified: Secondary | ICD-10-CM | POA: Diagnosis not present

## 2023-11-05 DIAGNOSIS — I10 Essential (primary) hypertension: Secondary | ICD-10-CM | POA: Diagnosis not present

## 2023-11-05 DIAGNOSIS — E43 Unspecified severe protein-calorie malnutrition: Secondary | ICD-10-CM | POA: Diagnosis not present

## 2023-11-05 DIAGNOSIS — F0394 Unspecified dementia, unspecified severity, with anxiety: Secondary | ICD-10-CM | POA: Diagnosis not present

## 2023-11-05 DIAGNOSIS — M80042D Age-related osteoporosis with current pathological fracture, left hand, subsequent encounter for fracture with routine healing: Secondary | ICD-10-CM | POA: Diagnosis not present

## 2023-11-05 DIAGNOSIS — F419 Anxiety disorder, unspecified: Secondary | ICD-10-CM | POA: Diagnosis not present

## 2023-11-05 DIAGNOSIS — R1310 Dysphagia, unspecified: Secondary | ICD-10-CM | POA: Diagnosis not present

## 2023-11-09 DIAGNOSIS — I10 Essential (primary) hypertension: Secondary | ICD-10-CM | POA: Diagnosis not present

## 2023-11-09 DIAGNOSIS — E43 Unspecified severe protein-calorie malnutrition: Secondary | ICD-10-CM | POA: Diagnosis not present

## 2023-11-09 DIAGNOSIS — F419 Anxiety disorder, unspecified: Secondary | ICD-10-CM | POA: Diagnosis not present

## 2023-11-09 DIAGNOSIS — M80042D Age-related osteoporosis with current pathological fracture, left hand, subsequent encounter for fracture with routine healing: Secondary | ICD-10-CM | POA: Diagnosis not present

## 2023-11-09 DIAGNOSIS — E876 Hypokalemia: Secondary | ICD-10-CM | POA: Diagnosis not present

## 2023-11-09 DIAGNOSIS — R1310 Dysphagia, unspecified: Secondary | ICD-10-CM | POA: Diagnosis not present

## 2023-11-09 DIAGNOSIS — E871 Hypo-osmolality and hyponatremia: Secondary | ICD-10-CM | POA: Diagnosis not present

## 2023-11-09 DIAGNOSIS — F0394 Unspecified dementia, unspecified severity, with anxiety: Secondary | ICD-10-CM | POA: Diagnosis not present

## 2023-11-09 DIAGNOSIS — E559 Vitamin D deficiency, unspecified: Secondary | ICD-10-CM | POA: Diagnosis not present

## 2023-11-09 DIAGNOSIS — E039 Hypothyroidism, unspecified: Secondary | ICD-10-CM | POA: Diagnosis not present

## 2023-11-11 DIAGNOSIS — S81809A Unspecified open wound, unspecified lower leg, initial encounter: Secondary | ICD-10-CM | POA: Diagnosis not present

## 2023-11-11 DIAGNOSIS — E43 Unspecified severe protein-calorie malnutrition: Secondary | ICD-10-CM | POA: Diagnosis not present

## 2023-11-11 DIAGNOSIS — E538 Deficiency of other specified B group vitamins: Secondary | ICD-10-CM | POA: Diagnosis not present

## 2023-11-11 DIAGNOSIS — G252 Other specified forms of tremor: Secondary | ICD-10-CM | POA: Diagnosis not present

## 2023-11-11 DIAGNOSIS — F0394 Unspecified dementia, unspecified severity, with anxiety: Secondary | ICD-10-CM | POA: Diagnosis not present

## 2023-11-11 DIAGNOSIS — I10 Essential (primary) hypertension: Secondary | ICD-10-CM | POA: Diagnosis not present

## 2023-11-11 DIAGNOSIS — R630 Anorexia: Secondary | ICD-10-CM | POA: Diagnosis not present

## 2023-11-11 DIAGNOSIS — S81801A Unspecified open wound, right lower leg, initial encounter: Secondary | ICD-10-CM | POA: Diagnosis not present

## 2023-11-11 DIAGNOSIS — E876 Hypokalemia: Secondary | ICD-10-CM | POA: Diagnosis not present

## 2023-11-11 DIAGNOSIS — M80042D Age-related osteoporosis with current pathological fracture, left hand, subsequent encounter for fracture with routine healing: Secondary | ICD-10-CM | POA: Diagnosis not present

## 2023-11-11 DIAGNOSIS — F419 Anxiety disorder, unspecified: Secondary | ICD-10-CM | POA: Diagnosis not present

## 2023-11-11 DIAGNOSIS — R55 Syncope and collapse: Secondary | ICD-10-CM | POA: Diagnosis not present

## 2023-11-11 DIAGNOSIS — F02B0 Dementia in other diseases classified elsewhere, moderate, without behavioral disturbance, psychotic disturbance, mood disturbance, and anxiety: Secondary | ICD-10-CM | POA: Diagnosis not present

## 2023-11-11 DIAGNOSIS — S5400XA Injury of ulnar nerve at forearm level, unspecified arm, initial encounter: Secondary | ICD-10-CM | POA: Diagnosis not present

## 2023-11-11 DIAGNOSIS — R1310 Dysphagia, unspecified: Secondary | ICD-10-CM | POA: Diagnosis not present

## 2023-11-16 DIAGNOSIS — R1310 Dysphagia, unspecified: Secondary | ICD-10-CM | POA: Diagnosis not present

## 2023-11-16 DIAGNOSIS — F419 Anxiety disorder, unspecified: Secondary | ICD-10-CM | POA: Diagnosis not present

## 2023-11-16 DIAGNOSIS — I10 Essential (primary) hypertension: Secondary | ICD-10-CM | POA: Diagnosis not present

## 2023-11-16 DIAGNOSIS — F0394 Unspecified dementia, unspecified severity, with anxiety: Secondary | ICD-10-CM | POA: Diagnosis not present

## 2023-11-16 DIAGNOSIS — E43 Unspecified severe protein-calorie malnutrition: Secondary | ICD-10-CM | POA: Diagnosis not present

## 2023-11-16 DIAGNOSIS — M80042D Age-related osteoporosis with current pathological fracture, left hand, subsequent encounter for fracture with routine healing: Secondary | ICD-10-CM | POA: Diagnosis not present

## 2023-11-20 DIAGNOSIS — I10 Essential (primary) hypertension: Secondary | ICD-10-CM | POA: Diagnosis not present

## 2023-11-20 DIAGNOSIS — M80042D Age-related osteoporosis with current pathological fracture, left hand, subsequent encounter for fracture with routine healing: Secondary | ICD-10-CM | POA: Diagnosis not present

## 2023-11-20 DIAGNOSIS — R1310 Dysphagia, unspecified: Secondary | ICD-10-CM | POA: Diagnosis not present

## 2023-11-20 DIAGNOSIS — F0394 Unspecified dementia, unspecified severity, with anxiety: Secondary | ICD-10-CM | POA: Diagnosis not present

## 2023-11-20 DIAGNOSIS — E43 Unspecified severe protein-calorie malnutrition: Secondary | ICD-10-CM | POA: Diagnosis not present

## 2023-11-20 DIAGNOSIS — F419 Anxiety disorder, unspecified: Secondary | ICD-10-CM | POA: Diagnosis not present

## 2023-11-23 DIAGNOSIS — E43 Unspecified severe protein-calorie malnutrition: Secondary | ICD-10-CM | POA: Diagnosis not present

## 2023-11-23 DIAGNOSIS — I10 Essential (primary) hypertension: Secondary | ICD-10-CM | POA: Diagnosis not present

## 2023-11-23 DIAGNOSIS — F0394 Unspecified dementia, unspecified severity, with anxiety: Secondary | ICD-10-CM | POA: Diagnosis not present

## 2023-11-23 DIAGNOSIS — F419 Anxiety disorder, unspecified: Secondary | ICD-10-CM | POA: Diagnosis not present

## 2023-11-23 DIAGNOSIS — R1310 Dysphagia, unspecified: Secondary | ICD-10-CM | POA: Diagnosis not present

## 2023-11-23 DIAGNOSIS — M80042D Age-related osteoporosis with current pathological fracture, left hand, subsequent encounter for fracture with routine healing: Secondary | ICD-10-CM | POA: Diagnosis not present

## 2023-11-24 DIAGNOSIS — M48061 Spinal stenosis, lumbar region without neurogenic claudication: Secondary | ICD-10-CM | POA: Diagnosis not present

## 2023-11-24 DIAGNOSIS — E43 Unspecified severe protein-calorie malnutrition: Secondary | ICD-10-CM | POA: Diagnosis not present

## 2023-11-24 DIAGNOSIS — R339 Retention of urine, unspecified: Secondary | ICD-10-CM | POA: Diagnosis not present

## 2023-11-24 DIAGNOSIS — M80042D Age-related osteoporosis with current pathological fracture, left hand, subsequent encounter for fracture with routine healing: Secondary | ICD-10-CM | POA: Diagnosis not present

## 2023-11-24 DIAGNOSIS — M5126 Other intervertebral disc displacement, lumbar region: Secondary | ICD-10-CM | POA: Diagnosis not present

## 2023-11-24 DIAGNOSIS — Z853 Personal history of malignant neoplasm of breast: Secondary | ICD-10-CM | POA: Diagnosis not present

## 2023-11-24 DIAGNOSIS — M1990 Unspecified osteoarthritis, unspecified site: Secondary | ICD-10-CM | POA: Diagnosis not present

## 2023-11-24 DIAGNOSIS — R1310 Dysphagia, unspecified: Secondary | ICD-10-CM | POA: Diagnosis not present

## 2023-11-24 DIAGNOSIS — Z8616 Personal history of COVID-19: Secondary | ICD-10-CM | POA: Diagnosis not present

## 2023-11-24 DIAGNOSIS — E785 Hyperlipidemia, unspecified: Secondary | ICD-10-CM | POA: Diagnosis not present

## 2023-11-24 DIAGNOSIS — Z9181 History of falling: Secondary | ICD-10-CM | POA: Diagnosis not present

## 2023-11-24 DIAGNOSIS — Z9012 Acquired absence of left breast and nipple: Secondary | ICD-10-CM | POA: Diagnosis not present

## 2023-11-24 DIAGNOSIS — I10 Essential (primary) hypertension: Secondary | ICD-10-CM | POA: Diagnosis not present

## 2023-11-24 DIAGNOSIS — S81801D Unspecified open wound, right lower leg, subsequent encounter: Secondary | ICD-10-CM | POA: Diagnosis not present

## 2023-11-24 DIAGNOSIS — K449 Diaphragmatic hernia without obstruction or gangrene: Secondary | ICD-10-CM | POA: Diagnosis not present

## 2023-11-24 DIAGNOSIS — M2578 Osteophyte, vertebrae: Secondary | ICD-10-CM | POA: Diagnosis not present

## 2023-11-24 DIAGNOSIS — K219 Gastro-esophageal reflux disease without esophagitis: Secondary | ICD-10-CM | POA: Diagnosis not present

## 2023-11-24 DIAGNOSIS — F419 Anxiety disorder, unspecified: Secondary | ICD-10-CM | POA: Diagnosis not present

## 2023-11-24 DIAGNOSIS — M47816 Spondylosis without myelopathy or radiculopathy, lumbar region: Secondary | ICD-10-CM | POA: Diagnosis not present

## 2023-11-24 DIAGNOSIS — Z7901 Long term (current) use of anticoagulants: Secondary | ICD-10-CM | POA: Diagnosis not present

## 2023-11-24 DIAGNOSIS — F0394 Unspecified dementia, unspecified severity, with anxiety: Secondary | ICD-10-CM | POA: Diagnosis not present

## 2023-11-26 DIAGNOSIS — Z515 Encounter for palliative care: Secondary | ICD-10-CM | POA: Diagnosis not present

## 2023-11-26 DIAGNOSIS — F0394 Unspecified dementia, unspecified severity, with anxiety: Secondary | ICD-10-CM | POA: Diagnosis not present

## 2023-11-26 DIAGNOSIS — M80042D Age-related osteoporosis with current pathological fracture, left hand, subsequent encounter for fracture with routine healing: Secondary | ICD-10-CM | POA: Diagnosis not present

## 2023-11-26 DIAGNOSIS — R1310 Dysphagia, unspecified: Secondary | ICD-10-CM | POA: Diagnosis not present

## 2023-11-26 DIAGNOSIS — S81801D Unspecified open wound, right lower leg, subsequent encounter: Secondary | ICD-10-CM | POA: Diagnosis not present

## 2023-11-26 DIAGNOSIS — I10 Essential (primary) hypertension: Secondary | ICD-10-CM | POA: Diagnosis not present

## 2023-11-26 DIAGNOSIS — E43 Unspecified severe protein-calorie malnutrition: Secondary | ICD-10-CM | POA: Diagnosis not present

## 2023-11-30 DIAGNOSIS — M80042D Age-related osteoporosis with current pathological fracture, left hand, subsequent encounter for fracture with routine healing: Secondary | ICD-10-CM | POA: Diagnosis not present

## 2023-11-30 DIAGNOSIS — R1310 Dysphagia, unspecified: Secondary | ICD-10-CM | POA: Diagnosis not present

## 2023-11-30 DIAGNOSIS — S81801D Unspecified open wound, right lower leg, subsequent encounter: Secondary | ICD-10-CM | POA: Diagnosis not present

## 2023-11-30 DIAGNOSIS — E43 Unspecified severe protein-calorie malnutrition: Secondary | ICD-10-CM | POA: Diagnosis not present

## 2023-11-30 DIAGNOSIS — I10 Essential (primary) hypertension: Secondary | ICD-10-CM | POA: Diagnosis not present

## 2023-11-30 DIAGNOSIS — F0394 Unspecified dementia, unspecified severity, with anxiety: Secondary | ICD-10-CM | POA: Diagnosis not present

## 2023-12-03 DIAGNOSIS — R1310 Dysphagia, unspecified: Secondary | ICD-10-CM | POA: Diagnosis not present

## 2023-12-03 DIAGNOSIS — S81801D Unspecified open wound, right lower leg, subsequent encounter: Secondary | ICD-10-CM | POA: Diagnosis not present

## 2023-12-03 DIAGNOSIS — F0394 Unspecified dementia, unspecified severity, with anxiety: Secondary | ICD-10-CM | POA: Diagnosis not present

## 2023-12-03 DIAGNOSIS — E43 Unspecified severe protein-calorie malnutrition: Secondary | ICD-10-CM | POA: Diagnosis not present

## 2023-12-03 DIAGNOSIS — I10 Essential (primary) hypertension: Secondary | ICD-10-CM | POA: Diagnosis not present

## 2023-12-03 DIAGNOSIS — M80042D Age-related osteoporosis with current pathological fracture, left hand, subsequent encounter for fracture with routine healing: Secondary | ICD-10-CM | POA: Diagnosis not present

## 2023-12-11 DIAGNOSIS — I82411 Acute embolism and thrombosis of right femoral vein: Secondary | ICD-10-CM | POA: Diagnosis not present

## 2023-12-11 DIAGNOSIS — Z79899 Other long term (current) drug therapy: Secondary | ICD-10-CM | POA: Diagnosis not present

## 2023-12-11 DIAGNOSIS — I82432 Acute embolism and thrombosis of left popliteal vein: Secondary | ICD-10-CM | POA: Diagnosis not present

## 2023-12-11 DIAGNOSIS — E538 Deficiency of other specified B group vitamins: Secondary | ICD-10-CM | POA: Diagnosis not present

## 2023-12-11 DIAGNOSIS — R7989 Other specified abnormal findings of blood chemistry: Secondary | ICD-10-CM | POA: Diagnosis not present

## 2023-12-11 DIAGNOSIS — R630 Anorexia: Secondary | ICD-10-CM | POA: Diagnosis not present

## 2023-12-11 DIAGNOSIS — E876 Hypokalemia: Secondary | ICD-10-CM | POA: Diagnosis not present

## 2023-12-28 DIAGNOSIS — G930 Cerebral cysts: Secondary | ICD-10-CM | POA: Diagnosis not present

## 2023-12-28 DIAGNOSIS — E782 Mixed hyperlipidemia: Secondary | ICD-10-CM | POA: Diagnosis not present

## 2023-12-28 DIAGNOSIS — E876 Hypokalemia: Secondary | ICD-10-CM | POA: Diagnosis not present

## 2024-01-12 DIAGNOSIS — F03C4 Unspecified dementia, severe, with anxiety: Secondary | ICD-10-CM | POA: Diagnosis not present

## 2024-01-12 DIAGNOSIS — Z7189 Other specified counseling: Secondary | ICD-10-CM | POA: Diagnosis not present

## 2024-01-12 DIAGNOSIS — E43 Unspecified severe protein-calorie malnutrition: Secondary | ICD-10-CM | POA: Diagnosis not present

## 2024-01-15 DIAGNOSIS — R7989 Other specified abnormal findings of blood chemistry: Secondary | ICD-10-CM | POA: Diagnosis not present

## 2024-01-15 DIAGNOSIS — L853 Xerosis cutis: Secondary | ICD-10-CM | POA: Diagnosis not present

## 2024-01-15 DIAGNOSIS — G252 Other specified forms of tremor: Secondary | ICD-10-CM | POA: Diagnosis not present

## 2024-01-15 DIAGNOSIS — I10 Essential (primary) hypertension: Secondary | ICD-10-CM | POA: Diagnosis not present

## 2024-01-15 DIAGNOSIS — F0394 Unspecified dementia, unspecified severity, with anxiety: Secondary | ICD-10-CM | POA: Diagnosis not present

## 2024-01-20 DIAGNOSIS — R531 Weakness: Secondary | ICD-10-CM | POA: Diagnosis not present

## 2024-01-20 DIAGNOSIS — E876 Hypokalemia: Secondary | ICD-10-CM | POA: Diagnosis not present

## 2024-01-20 DIAGNOSIS — E785 Hyperlipidemia, unspecified: Secondary | ICD-10-CM | POA: Diagnosis not present

## 2024-01-20 DIAGNOSIS — F419 Anxiety disorder, unspecified: Secondary | ICD-10-CM | POA: Diagnosis not present

## 2024-02-02 DIAGNOSIS — Z23 Encounter for immunization: Secondary | ICD-10-CM | POA: Diagnosis not present

## 2024-02-03 DIAGNOSIS — G252 Other specified forms of tremor: Secondary | ICD-10-CM | POA: Diagnosis not present

## 2024-02-03 DIAGNOSIS — Z86718 Personal history of other venous thrombosis and embolism: Secondary | ICD-10-CM | POA: Diagnosis not present

## 2024-02-03 DIAGNOSIS — E538 Deficiency of other specified B group vitamins: Secondary | ICD-10-CM | POA: Diagnosis not present

## 2024-02-03 DIAGNOSIS — F419 Anxiety disorder, unspecified: Secondary | ICD-10-CM | POA: Diagnosis not present

## 2024-02-03 DIAGNOSIS — F0394 Unspecified dementia, unspecified severity, with anxiety: Secondary | ICD-10-CM | POA: Diagnosis not present

## 2024-02-03 DIAGNOSIS — Z681 Body mass index (BMI) 19 or less, adult: Secondary | ICD-10-CM | POA: Diagnosis not present

## 2024-02-03 DIAGNOSIS — E46 Unspecified protein-calorie malnutrition: Secondary | ICD-10-CM | POA: Diagnosis not present

## 2024-02-03 DIAGNOSIS — F0393 Unspecified dementia, unspecified severity, with mood disturbance: Secondary | ICD-10-CM | POA: Diagnosis not present

## 2024-02-03 DIAGNOSIS — R55 Syncope and collapse: Secondary | ICD-10-CM | POA: Diagnosis not present

## 2024-02-03 DIAGNOSIS — R630 Anorexia: Secondary | ICD-10-CM | POA: Diagnosis not present

## 2024-02-03 DIAGNOSIS — F329 Major depressive disorder, single episode, unspecified: Secondary | ICD-10-CM | POA: Diagnosis not present

## 2024-02-07 DIAGNOSIS — R7309 Other abnormal glucose: Secondary | ICD-10-CM | POA: Diagnosis not present

## 2024-02-07 DIAGNOSIS — E038 Other specified hypothyroidism: Secondary | ICD-10-CM | POA: Diagnosis not present

## 2024-02-07 DIAGNOSIS — D519 Vitamin B12 deficiency anemia, unspecified: Secondary | ICD-10-CM | POA: Diagnosis not present

## 2024-02-07 DIAGNOSIS — Z79899 Other long term (current) drug therapy: Secondary | ICD-10-CM | POA: Diagnosis not present

## 2024-02-07 DIAGNOSIS — E559 Vitamin D deficiency, unspecified: Secondary | ICD-10-CM | POA: Diagnosis not present

## 2024-02-07 DIAGNOSIS — E782 Mixed hyperlipidemia: Secondary | ICD-10-CM | POA: Diagnosis not present

## 2024-02-17 DIAGNOSIS — G9340 Encephalopathy, unspecified: Secondary | ICD-10-CM | POA: Diagnosis not present

## 2024-02-17 DIAGNOSIS — I10 Essential (primary) hypertension: Secondary | ICD-10-CM | POA: Diagnosis not present

## 2024-02-17 DIAGNOSIS — E785 Hyperlipidemia, unspecified: Secondary | ICD-10-CM | POA: Diagnosis not present

## 2024-02-17 DIAGNOSIS — E876 Hypokalemia: Secondary | ICD-10-CM | POA: Diagnosis not present

## 2024-03-08 DIAGNOSIS — F5105 Insomnia due to other mental disorder: Secondary | ICD-10-CM | POA: Diagnosis not present

## 2024-03-08 DIAGNOSIS — F0390 Unspecified dementia without behavioral disturbance: Secondary | ICD-10-CM | POA: Diagnosis not present

## 2024-03-08 DIAGNOSIS — F413 Other mixed anxiety disorders: Secondary | ICD-10-CM | POA: Diagnosis not present

## 2024-03-09 DIAGNOSIS — I10 Essential (primary) hypertension: Secondary | ICD-10-CM | POA: Diagnosis not present

## 2024-03-09 DIAGNOSIS — E559 Vitamin D deficiency, unspecified: Secondary | ICD-10-CM | POA: Diagnosis not present

## 2024-03-09 DIAGNOSIS — E038 Other specified hypothyroidism: Secondary | ICD-10-CM | POA: Diagnosis not present

## 2024-03-09 DIAGNOSIS — E785 Hyperlipidemia, unspecified: Secondary | ICD-10-CM | POA: Diagnosis not present

## 2024-03-09 DIAGNOSIS — D519 Vitamin B12 deficiency anemia, unspecified: Secondary | ICD-10-CM | POA: Diagnosis not present

## 2024-03-09 DIAGNOSIS — E876 Hypokalemia: Secondary | ICD-10-CM | POA: Diagnosis not present

## 2024-03-09 DIAGNOSIS — G9340 Encephalopathy, unspecified: Secondary | ICD-10-CM | POA: Diagnosis not present

## 2024-03-09 DIAGNOSIS — E782 Mixed hyperlipidemia: Secondary | ICD-10-CM | POA: Diagnosis not present

## 2024-03-17 DIAGNOSIS — R011 Cardiac murmur, unspecified: Secondary | ICD-10-CM | POA: Diagnosis not present

## 2024-03-17 DIAGNOSIS — R012 Other cardiac sounds: Secondary | ICD-10-CM | POA: Diagnosis not present

## 2024-03-23 DIAGNOSIS — E785 Hyperlipidemia, unspecified: Secondary | ICD-10-CM | POA: Diagnosis not present

## 2024-03-23 DIAGNOSIS — E876 Hypokalemia: Secondary | ICD-10-CM | POA: Diagnosis not present

## 2024-03-23 DIAGNOSIS — R531 Weakness: Secondary | ICD-10-CM | POA: Diagnosis not present

## 2024-03-23 DIAGNOSIS — G9340 Encephalopathy, unspecified: Secondary | ICD-10-CM | POA: Diagnosis not present

## 2024-03-24 DIAGNOSIS — N390 Urinary tract infection, site not specified: Secondary | ICD-10-CM | POA: Diagnosis not present

## 2024-03-28 DIAGNOSIS — D519 Vitamin B12 deficiency anemia, unspecified: Secondary | ICD-10-CM | POA: Diagnosis not present

## 2024-03-28 DIAGNOSIS — E559 Vitamin D deficiency, unspecified: Secondary | ICD-10-CM | POA: Diagnosis not present

## 2024-03-28 DIAGNOSIS — E038 Other specified hypothyroidism: Secondary | ICD-10-CM | POA: Diagnosis not present

## 2024-03-28 DIAGNOSIS — E782 Mixed hyperlipidemia: Secondary | ICD-10-CM | POA: Diagnosis not present

## 2024-04-14 DIAGNOSIS — E876 Hypokalemia: Secondary | ICD-10-CM | POA: Diagnosis not present

## 2024-04-14 DIAGNOSIS — G9340 Encephalopathy, unspecified: Secondary | ICD-10-CM | POA: Diagnosis not present

## 2024-04-14 DIAGNOSIS — E785 Hyperlipidemia, unspecified: Secondary | ICD-10-CM | POA: Diagnosis not present

## 2024-04-14 DIAGNOSIS — I10 Essential (primary) hypertension: Secondary | ICD-10-CM | POA: Diagnosis not present

## 2024-05-13 ENCOUNTER — Emergency Department (HOSPITAL_COMMUNITY)

## 2024-05-13 ENCOUNTER — Inpatient Hospital Stay (HOSPITAL_COMMUNITY)
Admission: EM | Admit: 2024-05-13 | Discharge: 2024-05-22 | DRG: 200 | Disposition: A | Discharge status: Hospice | Source: Skilled Nursing Facility | Attending: Internal Medicine | Admitting: Internal Medicine

## 2024-05-13 DIAGNOSIS — Z79899 Other long term (current) drug therapy: Secondary | ICD-10-CM

## 2024-05-13 DIAGNOSIS — G250 Essential tremor: Secondary | ICD-10-CM | POA: Diagnosis present

## 2024-05-13 DIAGNOSIS — Z886 Allergy status to analgesic agent status: Secondary | ICD-10-CM

## 2024-05-13 DIAGNOSIS — M25562 Pain in left knee: Secondary | ICD-10-CM | POA: Diagnosis present

## 2024-05-13 DIAGNOSIS — K219 Gastro-esophageal reflux disease without esophagitis: Secondary | ICD-10-CM | POA: Diagnosis present

## 2024-05-13 DIAGNOSIS — Z9012 Acquired absence of left breast and nipple: Secondary | ICD-10-CM

## 2024-05-13 DIAGNOSIS — E876 Hypokalemia: Secondary | ICD-10-CM | POA: Diagnosis not present

## 2024-05-13 DIAGNOSIS — E785 Hyperlipidemia, unspecified: Secondary | ICD-10-CM | POA: Diagnosis present

## 2024-05-13 DIAGNOSIS — W06XXXA Fall from bed, initial encounter: Secondary | ICD-10-CM

## 2024-05-13 DIAGNOSIS — F0394 Unspecified dementia, unspecified severity, with anxiety: Secondary | ICD-10-CM | POA: Diagnosis present

## 2024-05-13 DIAGNOSIS — Z7901 Long term (current) use of anticoagulants: Secondary | ICD-10-CM

## 2024-05-13 DIAGNOSIS — R32 Unspecified urinary incontinence: Secondary | ICD-10-CM | POA: Diagnosis not present

## 2024-05-13 DIAGNOSIS — D72829 Elevated white blood cell count, unspecified: Secondary | ICD-10-CM | POA: Diagnosis present

## 2024-05-13 DIAGNOSIS — Y92129 Unspecified place in nursing home as the place of occurrence of the external cause: Secondary | ICD-10-CM

## 2024-05-13 DIAGNOSIS — M549 Dorsalgia, unspecified: Secondary | ICD-10-CM | POA: Diagnosis present

## 2024-05-13 DIAGNOSIS — F05 Delirium due to known physiological condition: Secondary | ICD-10-CM | POA: Diagnosis present

## 2024-05-13 DIAGNOSIS — Z6820 Body mass index (BMI) 20.0-20.9, adult: Secondary | ICD-10-CM

## 2024-05-13 DIAGNOSIS — Z880 Allergy status to penicillin: Secondary | ICD-10-CM

## 2024-05-13 DIAGNOSIS — F419 Anxiety disorder, unspecified: Secondary | ICD-10-CM | POA: Diagnosis present

## 2024-05-13 DIAGNOSIS — Z515 Encounter for palliative care: Secondary | ICD-10-CM

## 2024-05-13 DIAGNOSIS — Z66 Do not resuscitate: Secondary | ICD-10-CM | POA: Diagnosis present

## 2024-05-13 DIAGNOSIS — J939 Pneumothorax, unspecified: Secondary | ICD-10-CM | POA: Diagnosis present

## 2024-05-13 DIAGNOSIS — Z7189 Other specified counseling: Secondary | ICD-10-CM

## 2024-05-13 DIAGNOSIS — I10 Essential (primary) hypertension: Secondary | ICD-10-CM | POA: Diagnosis present

## 2024-05-13 DIAGNOSIS — F03918 Unspecified dementia, unspecified severity, with other behavioral disturbance: Secondary | ICD-10-CM | POA: Diagnosis present

## 2024-05-13 DIAGNOSIS — Z9181 History of falling: Secondary | ICD-10-CM

## 2024-05-13 DIAGNOSIS — S270XXA Traumatic pneumothorax, initial encounter: Principal | ICD-10-CM | POA: Diagnosis present

## 2024-05-13 DIAGNOSIS — Z853 Personal history of malignant neoplasm of breast: Secondary | ICD-10-CM

## 2024-05-13 DIAGNOSIS — F03911 Unspecified dementia, unspecified severity, with agitation: Secondary | ICD-10-CM | POA: Diagnosis present

## 2024-05-13 DIAGNOSIS — R627 Adult failure to thrive: Secondary | ICD-10-CM | POA: Diagnosis present

## 2024-05-13 DIAGNOSIS — Z885 Allergy status to narcotic agent status: Secondary | ICD-10-CM

## 2024-05-13 DIAGNOSIS — W19XXXA Unspecified fall, initial encounter: Principal | ICD-10-CM | POA: Diagnosis present

## 2024-05-13 DIAGNOSIS — S42032A Displaced fracture of lateral end of left clavicle, initial encounter for closed fracture: Secondary | ICD-10-CM | POA: Diagnosis present

## 2024-05-13 LAB — BASIC METABOLIC PANEL WITH GFR
Anion gap: 10 (ref 5–15)
BUN: 14 mg/dL (ref 8–23)
CO2: 25 mmol/L (ref 22–32)
Calcium: 9.3 mg/dL (ref 8.9–10.3)
Chloride: 108 mmol/L (ref 98–111)
Creatinine, Ser: 0.83 mg/dL (ref 0.44–1.00)
GFR, Estimated: >60 mL/min
Glucose, Bld: 127 mg/dL — ABNORMAL HIGH (ref 70–99)
Potassium: 4.1 mmol/L (ref 3.5–5.1)
Sodium: 143 mmol/L (ref 135–145)

## 2024-05-13 LAB — CBC WITH DIFFERENTIAL/PLATELET
Abs Immature Granulocytes: 0.1 K/uL — ABNORMAL HIGH (ref 0.00–0.07)
Basophils Absolute: 0.1 K/uL (ref 0.0–0.1)
Basophils Relative: 0 %
Eosinophils Absolute: 0 K/uL (ref 0.0–0.5)
Eosinophils Relative: 0 %
HCT: 46.4 % — ABNORMAL HIGH (ref 36.0–46.0)
Hemoglobin: 15 g/dL (ref 12.0–15.0)
Immature Granulocytes: 1 %
Lymphocytes Relative: 8 %
Lymphs Abs: 1.2 K/uL (ref 0.7–4.0)
MCH: 28.7 pg (ref 26.0–34.0)
MCHC: 32.3 g/dL (ref 30.0–36.0)
MCV: 88.7 fL (ref 80.0–100.0)
Monocytes Absolute: 1.1 K/uL — ABNORMAL HIGH (ref 0.1–1.0)
Monocytes Relative: 8 %
Neutro Abs: 12.1 K/uL — ABNORMAL HIGH (ref 1.7–7.7)
Neutrophils Relative %: 83 %
Platelets: 255 K/uL (ref 150–400)
RBC: 5.23 MIL/uL — ABNORMAL HIGH (ref 3.87–5.11)
RDW: 13.2 % (ref 11.5–15.5)
WBC: 14.5 K/uL — ABNORMAL HIGH (ref 4.0–10.5)
nRBC: 0 % (ref 0.0–0.2)

## 2024-05-13 LAB — CK: Total CK: 95 U/L (ref 38–234)

## 2024-05-13 MED ORDER — LORAZEPAM 0.5 MG PO TABS
0.5000 mg | ORAL_TABLET | Freq: Two times a day (BID) | ORAL | Status: DC | PRN
  Administered 2024-05-14 – 2024-05-22 (×11): 0.5 mg via ORAL
  Filled 2024-05-13 (×13): qty 1

## 2024-05-13 MED ORDER — SODIUM CHLORIDE 0.9 % IV BOLUS
500.0000 mL | Freq: Once | INTRAVENOUS | Status: AC
  Administered 2024-05-13: 500 mL via INTRAVENOUS

## 2024-05-13 MED ORDER — PROPRANOLOL HCL 20 MG PO TABS
40.0000 mg | ORAL_TABLET | Freq: Two times a day (BID) | ORAL | Status: DC
  Administered 2024-05-13 – 2024-05-22 (×17): 40 mg via ORAL
  Filled 2024-05-13 (×18): qty 2

## 2024-05-13 MED ORDER — ENOXAPARIN SODIUM 40 MG/0.4ML IJ SOSY
40.0000 mg | PREFILLED_SYRINGE | INTRAMUSCULAR | Status: DC
  Administered 2024-05-13 – 2024-05-21 (×9): 40 mg via SUBCUTANEOUS
  Filled 2024-05-13 (×9): qty 0.4

## 2024-05-13 MED ORDER — ONDANSETRON HCL 4 MG PO TABS: 4.0000 mg | ORAL_TABLET | Freq: Four times a day (QID) | ORAL | Status: DC | PRN

## 2024-05-13 MED ORDER — ONDANSETRON HCL 4 MG/2ML IJ SOLN: 4.0000 mg | Freq: Four times a day (QID) | INTRAMUSCULAR | Status: DC | PRN

## 2024-05-13 MED ORDER — ALBUTEROL SULFATE (2.5 MG/3ML) 0.083% IN NEBU: 2.5000 mg | INHALATION_SOLUTION | RESPIRATORY_TRACT | Status: DC | PRN

## 2024-05-13 MED ORDER — TAMSULOSIN HCL 0.4 MG PO CAPS
0.4000 mg | ORAL_CAPSULE | Freq: Every day | ORAL | Status: DC
  Administered 2024-05-13 – 2024-05-22 (×9): 0.4 mg via ORAL
  Filled 2024-05-13 (×10): qty 1

## 2024-05-13 MED ORDER — FENTANYL CITRATE (PF) 50 MCG/ML IJ SOSY
50.0000 ug | PREFILLED_SYRINGE | Freq: Once | INTRAMUSCULAR | Status: DC
  Filled 2024-05-13: qty 1

## 2024-05-13 MED ORDER — IBUPROFEN 200 MG PO TABS: 400.0000 mg | ORAL_TABLET | Freq: Four times a day (QID) | ORAL | Status: DC | PRN

## 2024-05-13 MED ORDER — PANTOPRAZOLE SODIUM 40 MG PO TBEC
40.0000 mg | DELAYED_RELEASE_TABLET | Freq: Every day | ORAL | Status: DC
  Administered 2024-05-13 – 2024-05-22 (×10): 40 mg via ORAL
  Filled 2024-05-13 (×10): qty 1

## 2024-05-13 MED ORDER — FENTANYL CITRATE (PF) 50 MCG/ML IJ SOSY
PREFILLED_SYRINGE | INTRAMUSCULAR | Status: AC
  Administered 2024-05-13: 50 ug
  Filled 2024-05-13: qty 1

## 2024-05-13 MED ORDER — TRAZODONE HCL 50 MG PO TABS: 50.0000 mg | ORAL_TABLET | Freq: Every evening | ORAL | Status: DC | PRN

## 2024-05-13 MED ORDER — MIRTAZAPINE 15 MG PO TABS
15.0000 mg | ORAL_TABLET | Freq: Every day | ORAL | Status: DC
  Administered 2024-05-13 – 2024-05-21 (×9): 15 mg via ORAL
  Filled 2024-05-13 (×4): qty 1
  Filled 2024-05-13: qty 2
  Filled 2024-05-13 (×4): qty 1
  Filled 2024-05-13: qty 2

## 2024-05-13 MED ORDER — ONDANSETRON HCL 4 MG/2ML IJ SOLN
4.0000 mg | Freq: Once | INTRAMUSCULAR | Status: AC
  Administered 2024-05-13: 4 mg via INTRAVENOUS
  Filled 2024-05-13: qty 2

## 2024-05-13 MED ORDER — LIDOCAINE-EPINEPHRINE (PF) 2 %-1:200000 IJ SOLN
20.0000 mL | Freq: Once | INTRAMUSCULAR | Status: AC
  Administered 2024-05-13: 20 mL
  Filled 2024-05-13: qty 20

## 2024-05-13 MED ORDER — DULOXETINE HCL 30 MG PO CPEP
30.0000 mg | ORAL_CAPSULE | Freq: Every day | ORAL | Status: DC
  Administered 2024-05-15 – 2024-05-16 (×2): 30 mg via ORAL
  Filled 2024-05-13 (×4): qty 1

## 2024-05-13 MED ORDER — FENTANYL CITRATE (PF) 50 MCG/ML IJ SOSY
25.0000 ug | PREFILLED_SYRINGE | INTRAMUSCULAR | Status: DC | PRN
  Administered 2024-05-13 – 2024-05-14 (×8): 25 ug via INTRAVENOUS
  Filled 2024-05-13 (×8): qty 1

## 2024-05-13 MED ORDER — FENTANYL CITRATE (PF) 50 MCG/ML IJ SOSY
50.0000 ug | PREFILLED_SYRINGE | Freq: Once | INTRAMUSCULAR | Status: AC
  Administered 2024-05-13: 50 ug via INTRAVENOUS
  Filled 2024-05-13: qty 1

## 2024-05-13 NOTE — ED Notes (Signed)
 Minimal serosanguinous drainage noted at connection site of chest tube upon transfer from hospital bed to CT machine. Nurse inspected connection site, (no disconnection noted)and re taped. DO notified.

## 2024-05-13 NOTE — ED Provider Notes (Signed)
" °  Physical Exam  BP (!) 144/94   Pulse 78   Temp (!) 97 F (36.1 C) (Axillary)   Resp 18   Ht 5' 3 (1.6 m)   Wt 53.4 kg   SpO2 93%   BMI 20.85 kg/m   Physical Exam  Procedures  Procedures  ED Course / MDM   Clinical Course as of 05/13/24 1732  Sat May 13, 2024  1602 Patient comes from nursing home with mechanical fall.  Found to have traumatic pneumothorax.  Left-sided.  Dr. Elnor to put in a pigtail catheter.  I will consult pulmonary team to manage the pigtail chest tube.  [AN]  1731 Dr. Shelah, pulmonary critical care will see the patient for chest tube management.  I have independently interpreted patient's chest x-ray, lung looks to be expanded.  Patient is stable for medicine admission. [AN]    Clinical Course User Index [AN] Charlyn Sora, MD   Medical Decision Making Amount and/or Complexity of Data Reviewed Labs: ordered. Radiology: ordered.  Risk Prescription drug management. Decision regarding hospitalization.   Traumatic pneumothorax, left-sided status post pigtail chest tube placement.       Charlyn Sora, MD 05/13/24 1732  "

## 2024-05-13 NOTE — ED Notes (Signed)
 Pt taken off NRB and placed on 4LNC, tolerating well at this time

## 2024-05-13 NOTE — Consult Note (Signed)
 "  NAME:  Nancy Bell, MRN:  [id number], DOB:  25-Sep-1924, LOS: 0 ADMISSION DATE:  05/13/2024, CONSULTATION DATE: 05/13/2024 REFERRING MD: Dr. Charlyn, CHIEF COMPLAINT:  pneumothorax   History of Present Illness:  89 year old woman with a history of breast cancer, dementia and memory loss residing in a memory care assisted living.  She apparently experienced a fall on 1/2, does not remember the details.  Began to complain of back pain on 1/3 as well as left shoulder pain and tenderness.  Chest x-ray in the ED revealed a large left pneumothorax.  Pigtail catheter being placed currently.  Pertinent  Medical History   Past Medical History:  Diagnosis Date   Anxiety    Cancer (HCC)    right breast   History of hiatal hernia    Hyperlipidemia    Hypertension      Significant Hospital Events: Including procedures, antibiotic start and stop dates in addition to other pertinent events   1/3 pelvis XR >> normal 1/3 L knee XR > no evidence fracture or dislocation 1/3 head CT >  1/3 C-spine CT >  1/3 L pigtail chest tube placed in ED  Interim History / Subjective:  As per HPI  Objective    Blood pressure (!) 171/125, pulse 79, temperature 98.4 F (36.9 C), temperature source Axillary, resp. rate (!) 25, height 5' 3 (1.6 m), weight 53.4 kg, SpO2 90%.        Intake/Output Summary (Last 24 hours) at 05/13/2024 1829 Last data filed at 05/13/2024 1645 Gross per 24 hour  Intake 500 ml  Output --  Net 500 ml   Filed Weights   05/13/24 1223  Weight: 53.4 kg    Examination: General: Thin ill-appearing elderly woman uncomfortable, moaning HENT: Oropharynx clear, dry Lungs: Scattered rhonchi bilaterally.  Left chest tube in place.  No airleak on -20 cm suction Cardiovascular: Tachycardic 110, regular Abdomen: Soft, nondistended positive bowel sounds Extremities: No edema Neuro: Awake, very uncomfortable, moaning, oriented to self.  Able to answer simple questions GU:  Deferred  Resolved problem list   Assessment and Plan   Traumatic left pneumothorax following mechanical fall - Pigtail chest tube in place - Suction -20 cm water -Need to ensure adequate pain control - Follow chest x-ray for resolution on suction. - When airleak resolves will consider placing to waterseal, assess for possible pigtail catheter removal.   Labs   CBC: Recent Labs  Lab 05/13/24 1522  WBC 14.5*  NEUTROABS 12.1*  HGB 15.0  HCT 46.4*  MCV 88.7  PLT 255    Basic Metabolic Panel: Recent Labs  Lab 05/13/24 1522  NA 143  K 4.1  CL 108  CO2 25  GLUCOSE 127*  BUN 14  CREATININE 0.83  CALCIUM 9.3   GFR: Estimated Creatinine Clearance: 32 mL/min (by C-G formula based on SCr of 0.83 mg/dL). Recent Labs  Lab 05/13/24 1522  WBC 14.5*    Liver Function Tests: No results for input(s): AST, ALT, ALKPHOS, BILITOT, PROT, ALBUMIN in the last 168 hours. No results for input(s): LIPASE, AMYLASE in the last 168 hours. No results for input(s): AMMONIA in the last 168 hours.  ABG    Component Value Date/Time   TCO2 25 10/11/2023 1640     Coagulation Profile: No results for input(s): INR, PROTIME in the last 168 hours.  Cardiac Enzymes: Recent Labs  Lab 05/13/24 1522  CKTOTAL 95    HbA1C: No results found for: HGBA1C  CBG: No results for input(s):  GLUCAP in the last 168 hours.  Review of Systems:   L back and shoulder pain  Past Medical History:  She,  has a past medical history of Anxiety, Cancer (HCC), History of hiatal hernia, Hyperlipidemia, and Hypertension.   Surgical History:   Past Surgical History:  Procedure Laterality Date   BREAST BIOPSY  2 weeks ago   BREAST LUMPECTOMY  01/29/2010   Right- Dr Merrilyn   TOOTH EXTRACTION     TOTAL MASTECTOMY Left 09/30/2020   Procedure: LEFT TOTAL MASTECTOMY;  Surgeon: Curvin Deward MOULD, MD;  Location: [facility] OR;  Service: General;  Laterality: Left;     Social History:    reports that she has never smoked. She has never used smokeless tobacco. She reports that she does not drink alcohol and does not use drugs.   Family History:  Her family history includes Cancer in her father; Cancer (age of onset: 44) in her son.   Allergies Allergies[1]   Home Medications  Prior to Admission medications  Medication Sig Start Date End Date Taking? Authorizing Provider  ALPRAZolam  (XANAX ) 0.25 MG tablet Take 0.125 mg by mouth 2 (two) times daily. 01/23/15   [provider]  Coenzyme Q10 (CO Q 10 PO) Take 1 capsule by mouth daily.    [provider]  dextromethorphan (DELSYM) 30 MG/5ML liquid TAKE 10MLS EVERY 12HRS FOR 7 DAYS 05/29/23   [provider]  divalproex (DEPAKOTE SPRINKLE) 125 MG capsule Take 125 mg by mouth 3 (three) times daily. 10/03/23   [provider]  DULoxetine  (CYMBALTA ) 60 MG capsule Take 60 mg by mouth daily. 09/28/23   [provider]  ELIQUIS 5 MG TABS tablet Take 5 mg by mouth 2 (two) times daily. 09/24/23   [provider]  escitalopram (LEXAPRO) 5 MG tablet Take 5 mg by mouth daily. 07/30/23   [provider]  ibuprofen  (ADVIL ) 100 MG/5ML suspension Take by mouth. 08/02/23   [provider]  loperamide  (IMODIUM ) 2 MG capsule Take by mouth. 05/14/23   [provider]  lovastatin (MEVACOR) 40 MG tablet Take 40 mg by mouth daily. 01/31/15   [provider]  mirtazapine  (REMERON ) 7.5 MG tablet Take 7.5 mg by mouth at bedtime. 09/28/23   [provider]  naproxen sodium (ALEVE) 220 MG tablet Take 220 mg by mouth daily as needed (pain).    [provider]  nystatin (MYCOSTATIN) 100000 UNIT/ML suspension  07/31/23   [provider]  omeprazole (PRILOSEC) 20 MG capsule Take 20 mg by mouth daily.    [provider]  ondansetron  (ZOFRAN -ODT) 4 MG disintegrating tablet Take by mouth. 08/03/23   [provider]  oxyCODONE  (OXY  IR/ROXICODONE ) 5 MG immediate release tablet Take by mouth. 08/15/23   [provider]  Potassium Chloride  ER 20 MEQ TBCR Take 20 mEq by mouth 2 (two) times daily. 03/10/23   [provider]  potassium chloride  SA (KLOR-CON  M) 20 MEQ tablet Take 20 mEq by mouth 2 (two) times daily. 09/28/23   [provider]  tamsulosin  (FLOMAX ) 0.4 MG CAPS capsule Take by mouth. 09/28/23   [provider]  torsemide (DEMADEX) 10 MG tablet Take 10 mg by mouth daily. 07/21/23   [provider]  triamcinolone cream (KENALOG) 0.5 % Apply topically. 06/12/23   [provider]     Critical care time: N/A       Lamar Chris, MD, PhD 05/13/2024, 6:29 PM [facility] [phone] or if  no answer before 7:00PM call [phone] For any issues after 7:00PM please call eLink [phone]        [1]  Allergies Allergen Reactions   Augmentin [Amoxicillin-Pot Clavulanate] Other (See Comments)    Develops C Diff   Vicodin [Hydrocodone-Acetaminophen ] Other (See Comments)    Insomnia    Oxycontin  [Oxycodone  Hcl] Other (See Comments)    Unknown reaction   Tylenol  [Acetaminophen ] Nausea And Vomiting    Chills    Ultram  [Tramadol  Hcl] Other (See Comments)    Unknown reaction   "

## 2024-05-13 NOTE — ED Triage Notes (Signed)
 Patient bib EMS from memory care unit Unwitnessed fall last night Per EMS patient did not remember fall Patient began c/o back pain today Oriented to self only at baseline per EMS C/o left shoulder pain and tenderness  50 mcg fentanyl  en route, 20G right forearm Vs WNL w/ EMS

## 2024-05-13 NOTE — H&P (Signed)
 " History and Physical  Nancy Bell FMW:[id number] DOB: 05-31-1924 DOA: 05/13/2024  PCP: Pia Kerney SQUIBB, MD   Chief Complaint: Fall, left-sided chest pain  HPI: Nancy Bell is a 89 y.o. female with medical history significant for advanced dementia, hypertension and anxiety being admitted to the hospital with traumatic left-sided pneumothorax.  Patient is a resident of memory care, her 2 daughters are at the bedside and state that she has recently been more combative and difficult to control.  The last several days, she has been trying to get out of bed, this morning they were called and told that the patient was found on the ground out of bed without any obvious injury.  For unclear reasons, she was brought to the emergency department about 8 hours later, complaining of left-sided chest/shoulder discomfort.  Workup as detailed below shows evidence of a large left pneumothorax, chest tube has been placed and hospitalist admission was requested.  Review of Systems: Please see HPI for pertinent positives and negatives. A complete 10 system review of systems are otherwise negative.  Past Medical History:  Diagnosis Date   Anxiety    Cancer [facility])    right breast   History of hiatal hernia    Hyperlipidemia    Hypertension    Past Surgical History:  Procedure Laterality Date   BREAST BIOPSY  2 weeks ago   BREAST LUMPECTOMY  01/29/2010   Right- Dr Merrilyn   TOOTH EXTRACTION     TOTAL MASTECTOMY Left 09/30/2020   Procedure: LEFT TOTAL MASTECTOMY;  Surgeon: Curvin Deward MOULD, MD;  Location: [facility] OR;  Service: General;  Laterality: Left;   Social History:  reports that she has never smoked. She has never used smokeless tobacco. She reports that she does not drink alcohol and does not use drugs.  Allergies[1]  Family History  Problem Relation Age of Onset   Cancer Father        kidney   Cancer Son 60       testicular     Prior to Admission medications  Medication Sig Start Date End  Date Taking? Authorizing Provider  ALPRAZolam  (XANAX ) 0.25 MG tablet Take 0.125 mg by mouth 2 (two) times daily. 01/23/15   [provider]  Coenzyme Q10 (CO Q 10 PO) Take 1 capsule by mouth daily.    [provider]  dextromethorphan (DELSYM) 30 MG/5ML liquid TAKE 10MLS EVERY 12HRS FOR 7 DAYS 05/29/23   [provider]  divalproex (DEPAKOTE SPRINKLE) 125 MG capsule Take 125 mg by mouth 3 (three) times daily. 10/03/23   [provider]  DULoxetine  (CYMBALTA ) 60 MG capsule Take 60 mg by mouth daily. 09/28/23   [provider]  ELIQUIS 5 MG TABS tablet Take 5 mg by mouth 2 (two) times daily. 09/24/23   [provider]  escitalopram (LEXAPRO) 5 MG tablet Take 5 mg by mouth daily. 07/30/23   [provider]  ibuprofen  (ADVIL ) 100 MG/5ML suspension Take by mouth. 08/02/23   [provider]  loperamide  (IMODIUM ) 2 MG capsule Take by mouth. 05/14/23   [provider]  lovastatin (MEVACOR) 40 MG tablet Take 40 mg by mouth daily. 01/31/15   [provider]  mirtazapine  (REMERON ) 7.5 MG tablet Take 7.5 mg by mouth at bedtime. 09/28/23   [provider]  naproxen sodium (ALEVE) 220 MG tablet Take 220 mg by mouth daily as needed (pain).    [provider]  nystatin (MYCOSTATIN) 100000 UNIT/ML suspension  07/31/23  [provider]  omeprazole (PRILOSEC) 20 MG capsule Take 20 mg by mouth daily.    [provider]  ondansetron  (ZOFRAN -ODT) 4 MG disintegrating tablet Take by mouth. 08/03/23   [provider]  oxyCODONE  (OXY IR/ROXICODONE ) 5 MG immediate release tablet Take by mouth. 08/15/23   [provider]  Potassium Chloride  ER 20 MEQ TBCR Take 20 mEq by mouth 2 (two) times daily. 03/10/23   [provider]  potassium chloride  SA (KLOR-CON  M) 20 MEQ tablet Take 20 mEq by mouth 2 (two) times daily. 09/28/23   [provider]  tamsulosin  (FLOMAX ) 0.4 MG CAPS capsule  Take by mouth. 09/28/23   [provider]  torsemide (DEMADEX) 10 MG tablet Take 10 mg by mouth daily. 07/21/23   [provider]  triamcinolone cream (KENALOG) 0.5 % Apply topically. 06/12/23   [provider]    Physical Exam: BP (!) 171/125   Pulse 79   Temp 98.4 F (36.9 C) (Axillary)   Resp (!) 25   Ht 5' 3 (1.6 m)   Wt 53.4 kg   SpO2 90%   BMI 20.85 kg/m  General: Alert, disoriented, moaning.  Her 2 daughters are at the bedside. Cardiovascular: RRR, no peripheral edema Respiratory: Appears to have bilateral air movement, no obvious wheezing or rhonchi.  Difficult exam as the patient is moaning in discomfort Abdomen: soft, nondistended Skin: dry, no rashes  Musculoskeletal: no joint effusions, normal range of motion  Neurologic: extraocular muscles intact, moving all extremities         Labs on Admission:  Basic Metabolic Panel: Recent Labs  Lab 05/13/24 1522  NA 143  K 4.1  CL 108  CO2 25  GLUCOSE 127*  BUN 14  CREATININE 0.83  CALCIUM 9.3   Liver Function Tests: No results for input(s): AST, ALT, ALKPHOS, BILITOT, PROT, ALBUMIN in the last 168 hours. No results for input(s): LIPASE, AMYLASE in the last 168 hours. No results for input(s): AMMONIA in the last 168 hours. CBC: Recent Labs  Lab 05/13/24 1522  WBC 14.5*  NEUTROABS 12.1*  HGB 15.0  HCT 46.4*  MCV 88.7  PLT 255   Cardiac Enzymes: Recent Labs  Lab 05/13/24 1522  CKTOTAL 95   BNP (last 3 results) No results for input(s): BNP in the last 8760 hours.  ProBNP (last 3 results) No results for input(s): PROBNP in the last 8760 hours.  CBG: No results for input(s): GLUCAP in the last 168 hours.  Radiological Exams on Admission: CT Cervical Spine Wo Contrast Result Date: 05/13/2024 CLINICAL DATA:  Fall EXAM: CT CERVICAL SPINE WITHOUT CONTRAST TECHNIQUE: Multidetector CT imaging of the cervical spine was performed without intravenous contrast.  Multiplanar CT image reconstructions were also generated. RADIATION DOSE REDUCTION: This exam was performed according to the departmental dose-optimization program which includes automated exposure control, adjustment of the mA and/or kV according to patient size and/or use of iterative reconstruction technique. COMPARISON:  Chest x-ray 05/13/2024, CT 08/14/2023 FINDINGS: Alignment: Motion degraded examination, facet alignment grossly maintained. Straightening of the cervical spine. Skull base and vertebrae: Limited fracture assessment due to significant motion degradation. Vertebral body heights are grossly normal Soft tissues and spinal canal: No prevertebral fluid or swelling. No visible canal hematoma. Disc levels: Moderate disc space narrowing C5-C6 and C6-C7. Multilevel hypertrophic facet degenerative changes. Upper chest: Left apical pneumothorax Other: None IMPRESSION: 1. Significantly motion degraded examination limiting assessment for fracture. 2. Left apical pneumothorax, see chest x-ray 05/13/2024 Electronically Signed  By: Luke Bun M.D.   On: 05/13/2024 18:14   CT Head Wo Contrast Result Date: 05/13/2024 CLINICAL DATA:  Head trauma EXAM: CT HEAD WITHOUT CONTRAST TECHNIQUE: Contiguous axial images were obtained from the base of the skull through the vertex without intravenous contrast. RADIATION DOSE REDUCTION: This exam was performed according to the departmental dose-optimization program which includes automated exposure control, adjustment of the mA and/or kV according to patient size and/or use of iterative reconstruction technique. COMPARISON:  MRI 10/11/2023, CT brain 10/11/2023 FINDINGS: Brain: No acute territorial infarction, hemorrhage or intracranial mass. Atrophy and chronic small vessel ischemic changes of the white matter. Stable ventricle size. Vascular: No hyperdense vessels.  No unexpected calcification Skull: Normal. Negative for fracture or focal lesion. Sinuses/Orbits: No acute  finding. Other: None IMPRESSION: 1. No CT evidence for acute intracranial abnormality. 2. Atrophy and chronic small vessel ischemic changes of the white matter. Electronically Signed   By: Luke Bun M.D.   On: 05/13/2024 18:09   DG Chest Portable 1 View Result Date: 05/13/2024 CLINICAL DATA:  Status post chest tube placement EXAM: PORTABLE CHEST 1 VIEW COMPARISON:  Same day FINDINGS: Interval placement of left-sided chest tube. Small left apical pneumothorax is noted which is significantly smaller compared to prior exam. Minimal subcutaneous emphysema is seen over left lateral chest wall. IMPRESSION: Interval placement of left-sided chest tube. Small left apical pneumothorax is noted which is significantly smaller compared to prior exam. Electronically Signed   By: Lynwood Landy Raddle M.D.   On: 05/13/2024 17:22   DG Knee Complete 4 Views Left Result Date: 05/13/2024 CLINICAL DATA:  Left knee pain after fall yesterday EXAM: LEFT KNEE - COMPLETE 4+ VIEW COMPARISON:  None Available. FINDINGS: No evidence of fracture, dislocation, or joint effusion. No evidence of arthropathy or other focal bone abnormality. Soft tissues are unremarkable. IMPRESSION: Negative. Electronically Signed   By: Lynwood Landy Raddle M.D.   On: 05/13/2024 14:06   DG Pelvis 1-2 Views Result Date: 05/13/2024 CLINICAL DATA:  Pelvic pain after fall yesterday. EXAM: PELVIS - 1-2 VIEW COMPARISON:  July 18, 2023 FINDINGS: There is no evidence of pelvic fracture or diastasis. No pelvic bone lesions are seen. Calcified uterine fibroid is again noted. IMPRESSION: No acute abnormality seen. Electronically Signed   By: Lynwood Landy Raddle M.D.   On: 05/13/2024 14:05   DG Chest Portable 1 View Result Date: 05/13/2024 CLINICAL DATA:  Chest pain after fall yesterday. EXAM: PORTABLE CHEST 1 VIEW COMPARISON:  October 11, 2023 FINDINGS: Interval development of large left pneumothorax. Stable cardiomediastinal silhouette. Right lung is clear. Bony thorax is  unremarkable. IMPRESSION: Interval development of large left pneumothorax. Critical Value/emergent results were called by telephone at the time of interpretation on 05/13/2024 at 2:03 pm to provider JAYSON PEREYRA , who verbally acknowledged these results. Electronically Signed   By: Lynwood Landy Raddle M.D.   On: 05/13/2024 14:03   Assessment/Plan Nancy Bell is a 89 y.o. female with medical history significant for advanced dementia, hypertension and anxiety being admitted to the hospital with traumatic left-sided pneumothorax.   Left-sided pneumothorax-traumatic, after being found on the ground today at her facility.  No other obvious injury, no fractures. -Observation admission -Continue chest tube to continuous suction -Pulmonary following -IV fentanyl  for pain  Agitation-family states there was some question of UTI at the facility, patient does not appear to be on any antibiotics.  She does have a leukocytosis which could be stress reaction to her fall and  pneumothorax -Check urinalysis  Unwitnessed fall-no other obvious injury or deformity -Follow-up CT head and cervical spine  GERD-omeprazole  Essential tremor-Inderal  twice daily  Dementia-Cymbalta , Remeron , Ativan  as needed for anxiety  DVT prophylaxis: Lovenox      Code Status: Limited: Do not attempt resuscitation (DNR) -DNR-LIMITED -Do Not Intubate/DNI   Consults called: Pulmonary  Admission status: Observation  Time spent: 48 minutes  Nancy Bell CHRISTELLA Gail MD [facility] Pager [phone]  If 7PM-7AM, please contact night-coverage [email or web address] Password [facility]  05/13/2024, 6:31 PM      [1]  Allergies Allergen Reactions   Augmentin [Amoxicillin-Pot Clavulanate] Other (See Comments)    Develops C Diff   Vicodin [Hydrocodone-Acetaminophen ] Other (See Comments)    Insomnia    Oxycontin  [Oxycodone  Hcl] Other (See Comments)    Unknown reaction   Tylenol  [Acetaminophen ] Nausea And Vomiting    Chills    Ultram   [Tramadol  Hcl] Other (See Comments)    Unknown reaction   "

## 2024-05-13 NOTE — ED Provider Notes (Signed)
 " [place] EMERGENCY DEPARTMENT AT [facility] Provider Note  CSN: [id number] Arrival date & time: 05/13/24 1215  Chief Complaint(s) Fall  HPI Nancy Bell is a 89 y.o. female with past medical history as below, significant for breast cancer, hyperlipidemia, hypertension, anxiety, dysphagia, breast cancer who presents to the ED with complaint of fall  Patient unwitnessed fall earlier today, she is at [facility].  Unknown downtime.  On my arrival patient complaining of pain to her left knee and to her chest.  Does not believe she hit her head.  No abdominal pain nausea or vomiting, no headache.  She is agitated, poor historian  Past Medical History Past Medical History:  Diagnosis Date   Anxiety    Cancer (HCC)    right breast   History of hiatal hernia    Hyperlipidemia    Hypertension    Patient Active Problem List   Diagnosis Date Noted   Protein-calorie malnutrition, severe 04/17/2023   Weakness 04/12/2023   Intractable nausea and vomiting 10/01/2022   Hypokalemia 10/01/2022   Age-related osteoporosis without current pathological fracture 09/30/2021   Anxiety disorder 09/30/2021   Dysphagia 09/30/2021   Essential hypertension 09/30/2021   Pure hypercholesterolemia 09/30/2021   Hyperglycemia 09/30/2021   Atherosclerosis of abdominal aorta 09/30/2021   Cancer of left female breast (HCC) 09/30/2020   Hx breast cancer, IDC right LIQ,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 Authorizing Provider  ALPRAZolam  (XANAX ) 0.25 MG tablet Take 0.125 mg by mouth 2 (two) times daily. 01/23/15   [provider]  Coenzyme Q10 (CO Q 10 PO) Take 1 capsule by mouth daily.    [provider]  dextromethorphan (DELSYM) 30 MG/5ML liquid TAKE 10MLS EVERY 12HRS FOR 7 DAYS 05/29/23   [provider]  divalproex (DEPAKOTE SPRINKLE) 125 MG capsule Take 125 mg by mouth 3  (three) times daily. 10/03/23   [provider]  DULoxetine  (CYMBALTA ) 60 MG capsule Take 60 mg by mouth daily. 09/28/23   [provider]  ELIQUIS 5 MG TABS tablet Take 5 mg by mouth 2 (two) times daily. 09/24/23   [provider]  escitalopram (LEXAPRO) 5 MG tablet Take 5 mg by mouth daily. 07/30/23   [provider]  ibuprofen  (ADVIL ) 100 MG/5ML suspension Take by mouth. 08/02/23   [provider]  loperamide  (IMODIUM ) 2 MG capsule Take by mouth. 05/14/23   [provider]  lovastatin (MEVACOR) 40 MG tablet Take 40 mg by mouth daily. 01/31/15   [provider]  mirtazapine  (REMERON ) 7.5 MG tablet Take 7.5 mg by mouth at bedtime. 09/28/23   [provider]  naproxen sodium (ALEVE) 220 MG tablet Take 220 mg by mouth daily as needed (pain).    [provider]  nystatin (MYCOSTATIN) 100000 UNIT/ML suspension  07/31/23   [provider]  omeprazole (PRILOSEC) 20 MG capsule Take 20 mg by mouth daily.    [provider]  ondansetron  (ZOFRAN -ODT) 4 MG disintegrating tablet Take by mouth. 08/03/23   [provider]  oxyCODONE  (OXY IR/ROXICODONE ) 5 MG immediate release tablet Take by mouth. 08/15/23   [provider]  Potassium Chloride  ER 20 MEQ TBCR Take 20 mEq by mouth 2 (two) times daily. 03/10/23   [provider]  potassium chloride  SA (KLOR-CON  M) 20 MEQ tablet Take 20 mEq by mouth 2 (two) times daily. 09/28/23   [provider]  tamsulosin  (FLOMAX ) 0.4 MG CAPS capsule Take by mouth. 09/28/23   [provider]  torsemide (DEMADEX) 10 MG tablet Take 10 mg by mouth daily. 07/21/23   [provider]  triamcinolone cream (KENALOG) 0.5 % Apply topically. 06/12/23   [provider]                                                                                                                                    Past Surgical History Past Surgical History:   Procedure Laterality Date   BREAST BIOPSY  2 weeks ago   BREAST LUMPECTOMY  01/29/2010   Right- Dr Merrilyn   TOOTH EXTRACTION     TOTAL MASTECTOMY Left 09/30/2020   Procedure: LEFT TOTAL MASTECTOMY;  Surgeon: Curvin Deward MOULD, MD;  Location: [facility] OR;  Service: General;  Laterality: Left;   Family History Family History  Problem Relation Age of Onset   Cancer Father        kidney   Cancer Son 73       testicular    Social History Social History[1] Allergies Augmentin [amoxicillin-pot clavulanate], Vicodin [hydrocodone-acetaminophen ], Oxycontin  [oxycodone  hcl], Tylenol  [acetaminophen ], and Ultram  [tramadol  hcl]  Review of Systems A thorough review of systems was obtained and all systems are negative except as noted in the HPI and PMH.   Physical Exam Vital Signs  I have reviewed the triage vital signs BP (!) 144/94   Pulse 78   Temp (!) 97 F (36.1 C) (Axillary)   Resp 18   Ht 5' 3 (1.6 m)   Wt 53.4 kg   SpO2 93%   BMI 20.85 kg/m  Physical Exam Vitals and nursing note reviewed.  Constitutional:      General: She is not in acute distress.    Appearance: Normal appearance. She is well-developed. She is not ill-appearing.  HENT:     Head: Normocephalic and atraumatic.     Right Ear: External ear normal.     Left Ear: External ear normal.     Nose: Nose normal.     Mouth/Throat:     Mouth: Mucous membranes are moist.  Eyes:     General: No scleral icterus.       Right eye: No discharge.        Left eye: No discharge.  Cardiovascular:     Rate and Rhythm: Normal rate.  Pulmonary:     Effort: Pulmonary effort is normal. No respiratory distress.     Breath sounds: Decreased air movement present. No stridor.     Comments: Breast sounds diminished on the left Chest:     Comments: Mastectomy surgical scar noted Abdominal:     General: Abdomen is flat. There is no distension.     Tenderness: There is no guarding.  Musculoskeletal:        General: No deformity.        Arms:  Cervical back: No rigidity.       Legs:  Skin:    General: Skin is warm and dry.     Coloration: Skin is not cyanotic, jaundiced or pale.  Neurological:     Mental Status: She is alert.  Psychiatric:        Speech: Speech normal.        Behavior: Behavior normal. Behavior is cooperative.     ED Results and Treatments Labs (all labs ordered are listed, but only abnormal results are displayed) Labs Reviewed  CBC WITH DIFFERENTIAL/PLATELET - Abnormal; Notable for the following components:      Result Value   WBC 14.5 (*)    RBC 5.23 (*)    HCT 46.4 (*)    Neutro Abs 12.1 (*)    Monocytes Absolute 1.1 (*)    Abs Immature Granulocytes 0.10 (*)    All other components within normal limits  BASIC METABOLIC PANEL WITH GFR - Abnormal; Notable for the following components:   Glucose, Bld 127 (*)    All other components within normal limits  CK                                                                                                                          Radiology DG Knee Complete 4 Views Left Result Date: 05/13/2024 CLINICAL DATA:  Left knee pain after fall yesterday EXAM: LEFT KNEE - COMPLETE 4+ VIEW COMPARISON:  None Available. FINDINGS: No evidence of fracture, dislocation, or joint effusion. No evidence of arthropathy or other focal bone abnormality. Soft tissues are unremarkable. IMPRESSION: Negative. Electronically Signed   By: Lynwood Landy Raddle M.D.   On: 05/13/2024 14:06   DG Pelvis 1-2 Views Result Date: 05/13/2024 CLINICAL DATA:  Pelvic pain after fall yesterday. EXAM: PELVIS - 1-2 VIEW COMPARISON:  July 18, 2023 FINDINGS: There is no evidence of pelvic fracture or diastasis. No pelvic bone lesions are seen. Calcified uterine fibroid is again noted. IMPRESSION: No acute abnormality seen. Electronically Signed   By: Lynwood Landy Raddle M.D.   On: 05/13/2024 14:05   DG Chest Portable 1 View Result Date: 05/13/2024 CLINICAL DATA:  Chest pain after fall yesterday.  EXAM: PORTABLE CHEST 1 VIEW COMPARISON:  October 11, 2023 FINDINGS: Interval development of large left pneumothorax. Stable cardiomediastinal silhouette. Right lung is clear. Bony thorax is unremarkable. IMPRESSION: Interval development of large left pneumothorax. Critical Value/emergent results were called by telephone at the time of interpretation on 05/13/2024 at 2:03 pm to provider JAYSON PEREYRA , who verbally acknowledged these results. Electronically Signed   By: Lynwood Landy Raddle M.D.   On: 05/13/2024 14:03    Pertinent labs & imaging results that were available during my care of the patient were reviewed by me and considered in my medical decision making (see MDM for details).  Medications Ordered in ED Medications  fentaNYL  (SUBLIMAZE ) injection 50 mcg (50 mcg Intravenous Not Given 05/13/24 1616)  lidocaine -EPINEPHrine  (XYLOCAINE  W/EPI) 2 %-1:200000 (  PF) injection 20 mL (20 mLs Other Given 05/13/24 1647)  fentaNYL  (SUBLIMAZE ) injection 50 mcg (50 mcg Intravenous Given 05/13/24 1520)  ondansetron  (ZOFRAN ) injection 4 mg (4 mg Intravenous Given 05/13/24 1521)  sodium chloride  0.9 % bolus 500 mL (0 mLs Intravenous Stopped 05/13/24 1645)  fentaNYL  (SUBLIMAZE ) 50 MCG/ML injection (50 mcg  Given 05/13/24 1615)                                                                                                                                     Procedures .Critical Care  Performed by: Elnor Jayson LABOR, DO Authorized by: Elnor Jayson LABOR, DO   Critical care provider statement:    Critical care time (minutes):  45   Critical care time was exclusive of:  Separately billable procedures and treating other patients   Critical care was time spent personally by me on the following activities:  Development of treatment plan with patient or surrogate, discussions with consultants, evaluation of patient's response to treatment, examination of patient, ordering and review of laboratory studies, ordering and review of radiographic  studies, ordering and performing treatments and interventions, pulse oximetry, re-evaluation of patient's condition, review of old charts and obtaining history from patient or surrogate CHEST TUBE INSERTION  Date/Time: 05/13/2024 4:57 PM  Performed by: Elnor Jayson LABOR, DO Authorized by: Elnor Jayson LABOR, DO   Consent:    Consent obtained:  Written   Consent given by:  Healthcare agent   Risks, benefits, and alternatives were discussed: yes     Risks discussed:  Bleeding, incomplete drainage, nerve damage, pain, infection and damage to surrounding structures   Alternatives discussed:  No treatment, delayed treatment and alternative treatment Universal protocol:    Procedure explained and questions answered to patient or proxy's satisfaction: yes     Immediately prior to procedure, a time out was called: yes     Patient identity confirmed:  Arm band and verbally with patient Pre-procedure details:    Skin preparation:  Chlorhexidine  with alcohol   Preparation: Patient was prepped and draped in the usual sterile fashion   Sedation:    Sedation type:  None Anesthesia:    Anesthesia method:  Local infiltration   Local anesthetic:  Lidocaine  2% WITH epi Procedure details:    Placement location:  L lateral   Scalpel size:  11   Tube size (French): pigtail.   Ultrasound guidance: no     Tension pneumothorax: no     Tube connected to:  Suction   Drainage characteristics:  Air only   Suture material:  0 silk   Dressing:  4x4 sterile gauze and petrolatum-impregnated gauze Post-procedure details:    Post-insertion x-ray findings: tube in good position     Procedure completion:  Tolerated well, no immediate complications   (including critical care time)  Medical Decision Making / ED Course    Medical Decision Making:    Elnor Renovato  Ledwell is a 89 y.o. female with past medical history as below, significant for breast cancer, hyperlipidemia, hypertension, anxiety, dysphagia, breast cancer  who presents to the ED with complaint of fall. The complaint involves an extensive differential diagnosis and also carries with it a high risk of complications and morbidity.  Serious etiology was considered. Ddx includes but is not limited to: Differential diagnoses for head trauma includes subdural hematoma, epidural hematoma, acute concussion, traumatic subarachnoid hemorrhage, cerebral contusions, fracture, pneumothorax, hematoma etc.   Complete initial physical exam performed, notably the patient was in no acute distress.    Reviewed and confirmed nursing documentation for past medical history, family history, social history.  Vital signs reviewed.    Unwitnessed fall PTX> - Unwitnessed fall at nursing home.  Unknown downtime.  She is a very poor historian. - Chest x-ray revealing for large left-sided pneumothorax.  No obvious rib fracture on x-ray per radiology - O2 sat greater than 94% on 2 L nasal cannula.  Will increase preparing for pigtail - Daughters at bedside hold power of attorney, discuss intervention with them at bedside at length.  They are agreeable to intervention/pigtail, agreeable to admission as well - pigtail placed, lung re-inflated on rpt cxr - CT imaging of the head and C-spine is pending at time of shift change. - plan admit    Clinical Course as of 05/13/24 1659  Sat May 13, 2024  1602 Patient comes from nursing home with mechanical fall.  Found to have traumatic pneumothorax.  Left-sided.  Dr. Elnor to put in a pigtail catheter.  I will consult pulmonary team to manage the pigtail chest tube.  [AN]    Clinical Course User Index [AN] Charlyn Sora, MD     Dr. Charlyn taking over               Additional history obtained: -Additional history obtained from family -External records from outside source obtained and reviewed including: Chart review including previous notes, labs, imaging, consultation notes including  Home medications, prior ER  eval   Lab Tests: -I ordered, reviewed, and interpreted labs.   The pertinent results include:   Labs Reviewed  CBC WITH DIFFERENTIAL/PLATELET - Abnormal; Notable for the following components:      Result Value   WBC 14.5 (*)    RBC 5.23 (*)    HCT 46.4 (*)    Neutro Abs 12.1 (*)    Monocytes Absolute 1.1 (*)    Abs Immature Granulocytes 0.10 (*)    All other components within normal limits  BASIC METABOLIC PANEL WITH GFR - Abnormal; Notable for the following components:   Glucose, Bld 127 (*)    All other components within normal limits  CK    Notable for home medications, prior ER eval  EKG   EKG Interpretation Date/Time:    Ventricular Rate:    PR Interval:    QRS Duration:    QT Interval:    QTC Calculation:   R Axis:      Text Interpretation:           Imaging Studies ordered: I ordered imaging studies including CT head, CT C-spine, chest x-ray, knee x-ray, pelvic x-ray>> pending I independently visualized the following imaging with scope of interpretation limited to determining acute life threatening conditions related to emergency care; findings noted above I agree with the radiologist interpretation If any imaging was obtained with contrast I closely monitored patient for any possible adverse reaction a/w contrast administration in the emergency  department   Medicines ordered and prescription drug management: Meds ordered this encounter  Medications   lidocaine -EPINEPHrine  (XYLOCAINE  W/EPI) 2 %-1:200000 (PF) injection 20 mL   fentaNYL  (SUBLIMAZE ) injection 50 mcg   ondansetron  (ZOFRAN ) injection 4 mg   sodium chloride  0.9 % bolus 500 mL   fentaNYL  (SUBLIMAZE ) 50 MCG/ML injection    Ricketts, Lexi R: cabinet override   fentaNYL  (SUBLIMAZE ) injection 50 mcg    -I have reviewed the patients home medicines and have made adjustments as needed   Consultations Obtained: na   Cardiac Monitoring: The patient was maintained on a cardiac monitor.  I  personally viewed and interpreted the cardiac monitored which showed an underlying rhythm of: sinus tachy > nsr Continuous pulse oximetry interpreted by myself, 95% on 2L.    Social Determinants of Health:  Diagnosis or treatment significantly limited by social determinants of health: SNF resident   Reevaluation: After the interventions noted above, I reevaluated the patient and found that they have improved  Co morbidities that complicate the patient evaluation  Past Medical History:  Diagnosis Date   Anxiety    Cancer (HCC)    right breast   History of hiatal hernia    Hyperlipidemia    Hypertension       Dispostion: Disposition decision including need for hospitalization was considered, and patient admitted to the hospital.    Final Clinical Impression(s) / ED Diagnoses Final diagnoses:  Fall, initial encounter  Traumatic pneumothorax, initial encounter         [1]  Social History Tobacco Use   Smoking status: Never   Smokeless tobacco: Never   Tobacco comments:    never used tobacco  Vaping Use   Vaping status: Never Used  Substance Use Topics   Alcohol use: No    Alcohol/week: 0.0 standard drinks of alcohol   Drug use: No     Elnor Jayson LABOR, DO 05/13/24 1659  "

## 2024-05-14 ENCOUNTER — Observation Stay (HOSPITAL_COMMUNITY)

## 2024-05-14 ENCOUNTER — Other Ambulatory Visit: Payer: Self-pay

## 2024-05-14 ENCOUNTER — Inpatient Hospital Stay (HOSPITAL_COMMUNITY)

## 2024-05-14 ENCOUNTER — Encounter (HOSPITAL_COMMUNITY): Payer: Self-pay | Admitting: Internal Medicine

## 2024-05-14 DIAGNOSIS — Z9012 Acquired absence of left breast and nipple: Secondary | ICD-10-CM | POA: Diagnosis not present

## 2024-05-14 DIAGNOSIS — K219 Gastro-esophageal reflux disease without esophagitis: Secondary | ICD-10-CM | POA: Diagnosis present

## 2024-05-14 DIAGNOSIS — F03911 Unspecified dementia, unspecified severity, with agitation: Secondary | ICD-10-CM | POA: Diagnosis present

## 2024-05-14 DIAGNOSIS — W19XXXD Unspecified fall, subsequent encounter: Secondary | ICD-10-CM | POA: Diagnosis not present

## 2024-05-14 DIAGNOSIS — F03918 Unspecified dementia, unspecified severity, with other behavioral disturbance: Secondary | ICD-10-CM | POA: Insufficient documentation

## 2024-05-14 DIAGNOSIS — G250 Essential tremor: Secondary | ICD-10-CM | POA: Diagnosis present

## 2024-05-14 DIAGNOSIS — Z66 Do not resuscitate: Secondary | ICD-10-CM | POA: Diagnosis present

## 2024-05-14 DIAGNOSIS — R079 Chest pain, unspecified: Secondary | ICD-10-CM | POA: Diagnosis present

## 2024-05-14 DIAGNOSIS — W19XXXA Unspecified fall, initial encounter: Secondary | ICD-10-CM | POA: Diagnosis present

## 2024-05-14 DIAGNOSIS — M25562 Pain in left knee: Secondary | ICD-10-CM | POA: Diagnosis present

## 2024-05-14 DIAGNOSIS — Z7901 Long term (current) use of anticoagulants: Secondary | ICD-10-CM | POA: Diagnosis not present

## 2024-05-14 DIAGNOSIS — S270XXA Traumatic pneumothorax, initial encounter: Principal | ICD-10-CM

## 2024-05-14 DIAGNOSIS — Z7189 Other specified counseling: Secondary | ICD-10-CM | POA: Diagnosis not present

## 2024-05-14 DIAGNOSIS — I10 Essential (primary) hypertension: Secondary | ICD-10-CM

## 2024-05-14 DIAGNOSIS — J939 Pneumothorax, unspecified: Secondary | ICD-10-CM | POA: Diagnosis not present

## 2024-05-14 DIAGNOSIS — R627 Adult failure to thrive: Secondary | ICD-10-CM | POA: Diagnosis present

## 2024-05-14 DIAGNOSIS — R32 Unspecified urinary incontinence: Secondary | ICD-10-CM | POA: Diagnosis not present

## 2024-05-14 DIAGNOSIS — F0394 Unspecified dementia, unspecified severity, with anxiety: Secondary | ICD-10-CM | POA: Diagnosis present

## 2024-05-14 DIAGNOSIS — Z79899 Other long term (current) drug therapy: Secondary | ICD-10-CM | POA: Diagnosis not present

## 2024-05-14 DIAGNOSIS — F419 Anxiety disorder, unspecified: Secondary | ICD-10-CM

## 2024-05-14 DIAGNOSIS — D72829 Elevated white blood cell count, unspecified: Secondary | ICD-10-CM | POA: Diagnosis present

## 2024-05-14 DIAGNOSIS — F05 Delirium due to known physiological condition: Secondary | ICD-10-CM

## 2024-05-14 DIAGNOSIS — M549 Dorsalgia, unspecified: Secondary | ICD-10-CM | POA: Diagnosis present

## 2024-05-14 DIAGNOSIS — S42032A Displaced fracture of lateral end of left clavicle, initial encounter for closed fracture: Secondary | ICD-10-CM | POA: Diagnosis present

## 2024-05-14 DIAGNOSIS — Z6820 Body mass index (BMI) 20.0-20.9, adult: Secondary | ICD-10-CM | POA: Diagnosis not present

## 2024-05-14 DIAGNOSIS — Y92129 Unspecified place in nursing home as the place of occurrence of the external cause: Secondary | ICD-10-CM | POA: Diagnosis not present

## 2024-05-14 DIAGNOSIS — E785 Hyperlipidemia, unspecified: Secondary | ICD-10-CM | POA: Diagnosis present

## 2024-05-14 DIAGNOSIS — E876 Hypokalemia: Secondary | ICD-10-CM | POA: Diagnosis not present

## 2024-05-14 DIAGNOSIS — Z886 Allergy status to analgesic agent status: Secondary | ICD-10-CM | POA: Diagnosis not present

## 2024-05-14 DIAGNOSIS — Z515 Encounter for palliative care: Secondary | ICD-10-CM | POA: Diagnosis not present

## 2024-05-14 DIAGNOSIS — Z9181 History of falling: Secondary | ICD-10-CM | POA: Diagnosis not present

## 2024-05-14 LAB — CBC
HCT: 41 % (ref 36.0–46.0)
Hemoglobin: 13.3 g/dL (ref 12.0–15.0)
MCH: 28.8 pg (ref 26.0–34.0)
MCHC: 32.4 g/dL (ref 30.0–36.0)
MCV: 88.7 fL (ref 80.0–100.0)
Platelets: 204 K/uL (ref 150–400)
RBC: 4.62 MIL/uL (ref 3.87–5.11)
RDW: 13.2 % (ref 11.5–15.5)
WBC: 11.8 K/uL — ABNORMAL HIGH (ref 4.0–10.5)
nRBC: 0 % (ref 0.0–0.2)

## 2024-05-14 LAB — BASIC METABOLIC PANEL WITH GFR
Anion gap: 10 (ref 5–15)
BUN: 15 mg/dL (ref 8–23)
CO2: 23 mmol/L (ref 22–32)
Calcium: 8.7 mg/dL — ABNORMAL LOW (ref 8.9–10.3)
Chloride: 109 mmol/L (ref 98–111)
Creatinine, Ser: 0.78 mg/dL (ref 0.44–1.00)
GFR, Estimated: >60 mL/min
Glucose, Bld: 118 mg/dL — ABNORMAL HIGH (ref 70–99)
Potassium: 3.9 mmol/L (ref 3.5–5.1)
Sodium: 142 mmol/L (ref 135–145)

## 2024-05-14 MED ORDER — FENTANYL CITRATE (PF) 50 MCG/ML IJ SOSY
25.0000 ug | PREFILLED_SYRINGE | INTRAMUSCULAR | Status: DC | PRN
  Administered 2024-05-14: 50 ug via INTRAVENOUS
  Filled 2024-05-14 (×2): qty 1

## 2024-05-14 MED ORDER — METHOCARBAMOL 1000 MG/10ML IJ SOLN
500.0000 mg | Freq: Three times a day (TID) | INTRAMUSCULAR | Status: DC | PRN
  Administered 2024-05-14 – 2024-05-20 (×2): 500 mg via INTRAVENOUS
  Filled 2024-05-14 (×2): qty 10

## 2024-05-14 MED ORDER — SENNOSIDES-DOCUSATE SODIUM 8.6-50 MG PO TABS
1.0000 | ORAL_TABLET | Freq: Two times a day (BID) | ORAL | Status: DC
  Administered 2024-05-14 – 2024-05-22 (×16): 1 via ORAL
  Filled 2024-05-14 (×16): qty 1

## 2024-05-14 MED ORDER — FENTANYL CITRATE (PF) 50 MCG/ML IJ SOSY
25.0000 ug | PREFILLED_SYRINGE | INTRAMUSCULAR | Status: DC | PRN
  Administered 2024-05-14 – 2024-05-22 (×9): 25 ug via INTRAVENOUS
  Filled 2024-05-14 (×10): qty 1

## 2024-05-14 MED ORDER — SODIUM CHLORIDE 0.9 % IV BOLUS
250.0000 mL | Freq: Once | INTRAVENOUS | Status: AC
  Administered 2024-05-14: 250 mL via INTRAVENOUS

## 2024-05-14 MED ORDER — KETOROLAC TROMETHAMINE 30 MG/ML IJ SOLN: 15.0000 mg | Freq: Three times a day (TID) | INTRAMUSCULAR | Status: DC

## 2024-05-14 MED ORDER — SODIUM CHLORIDE 0.9 % IV SOLN: INTRAVENOUS | Status: AC

## 2024-05-14 MED ORDER — OLANZAPINE 5 MG PO TBDP
2.5000 mg | ORAL_TABLET | Freq: Every day | ORAL | Status: DC
  Administered 2024-05-14 – 2024-05-16 (×3): 2.5 mg via ORAL
  Filled 2024-05-14 (×4): qty 0.5

## 2024-05-14 MED ORDER — ORAL CARE MOUTH RINSE: 15.0000 mL | OROMUCOSAL | Status: DC | PRN

## 2024-05-14 MED ORDER — OXYCODONE HCL 5 MG/5ML PO SOLN
5.0000 mg | Freq: Four times a day (QID) | ORAL | Status: DC | PRN
  Administered 2024-05-15 – 2024-05-19 (×6): 5 mg via ORAL
  Filled 2024-05-14 (×6): qty 5

## 2024-05-14 MED ORDER — KETOROLAC TROMETHAMINE 30 MG/ML IJ SOLN
15.0000 mg | Freq: Three times a day (TID) | INTRAMUSCULAR | Status: AC
  Administered 2024-05-14 – 2024-05-17 (×9): 15 mg via INTRAVENOUS
  Filled 2024-05-14 (×9): qty 1

## 2024-05-14 NOTE — Consult Note (Signed)
 "  NAME:  Nancy Bell, MRN:  [id number], DOB:  10-08-1924, LOS: 0 ADMISSION DATE:  05/13/2024, CONSULTATION DATE: 05/13/2024 REFERRING MD: Dr. Charlyn, CHIEF COMPLAINT:  pneumothorax   History of Present Illness:  89 year old woman with a history of breast cancer, dementia and memory loss residing in a memory care assisted living.  She apparently experienced a fall on 1/2, does not remember the details.  Began to complain of back pain on 1/3 as well as left shoulder pain and tenderness.  Chest x-ray in the ED revealed a large left pneumothorax.  Pigtail catheter being placed currently.  Pertinent  Medical History   Past Medical History:  Diagnosis Date   Anxiety    Cancer (HCC)    right breast   History of hiatal hernia    Hyperlipidemia    Hypertension      Significant Hospital Events: Including procedures, antibiotic start and stop dates in addition to other pertinent events   1/3 pelvis XR >> normal 1/3 L knee XR > no evidence fracture or dislocation 1/3 head CT >  1/3 C-spine CT >  1/3 L pigtail chest tube placed in ED  Interim History / Subjective:   More comfortable today Able to answer simple questions but would not cough on request  Objective    Blood pressure 129/61, pulse 65, temperature 97.7 F (36.5 C), temperature source Oral, resp. rate (!) 24, height 5' 3 (1.6 m), weight 53.4 kg, SpO2 95%.        Intake/Output Summary (Last 24 hours) at 05/14/2024 1156 Last data filed at 05/13/2024 1645 Gross per 24 hour  Intake 500 ml  Output --  Net 500 ml   Filed Weights   05/13/24 1223  Weight: 53.4 kg    Examination: General: Thin elderly woman in no distress HENT: Oropharynx somewhat dry Lungs: Clear bilaterally, decreased to both bases.  Left chest tube in place.  I do not see an air leak on -20 cm suction Cardiovascular: Regular, no murmur Abdomen: Nondistended with positive bowel sounds Extremities: No edema Neuro: Awake, more comfortable, oriented to  self but not medical situation.  Follows some simple commands but would not cough on request GU: Deferred  Resolved problem list   Assessment and Plan   Traumatic left pneumothorax following mechanical fall - Pigtail chest tube in place - I do not see an air leak so we will transition to waterseal and recheck a chest x-ray this afternoon.  If pneumothorax does not reaccumulate then repeat chest x-ray 1/5.  If no reaccumulation at that time then the chest tube can likely come out - Continue to ensure adequate pain control    Labs   CBC: Recent Labs  Lab 05/13/24 1522 05/14/24 0417  WBC 14.5* 11.8*  NEUTROABS 12.1*  --   HGB 15.0 13.3  HCT 46.4* 41.0  MCV 88.7 88.7  PLT 255 204    Basic Metabolic Panel: Recent Labs  Lab 05/13/24 1522 05/14/24 0417  NA 143 142  K 4.1 3.9  CL 108 109  CO2 25 23  GLUCOSE 127* 118*  BUN 14 15  CREATININE 0.83 0.78  CALCIUM 9.3 8.7*   GFR: Estimated Creatinine Clearance: 33.3 mL/min (by C-G formula based on SCr of 0.78 mg/dL). Recent Labs  Lab 05/13/24 1522 05/14/24 0417  WBC 14.5* 11.8*    Liver Function Tests: No results for input(s): AST, ALT, ALKPHOS, BILITOT, PROT, ALBUMIN in the last 168 hours. No results for input(s): LIPASE, AMYLASE in the  last 168 hours. No results for input(s): AMMONIA in the last 168 hours.  ABG    Component Value Date/Time   TCO2 25 10/11/2023 1640     Coagulation Profile: No results for input(s): INR, PROTIME in the last 168 hours.  Cardiac Enzymes: Recent Labs  Lab 05/13/24 1522  CKTOTAL 95     Critical care time: N/A       Lamar Chris, MD, PhD 05/14/2024, 11:56 AM [facility] [phone] or if no answer before 7:00PM call [phone] For any issues after 7:00PM please call eLink [phone]     "

## 2024-05-14 NOTE — Progress Notes (Signed)
 Orthopedic Tech Progress Note Patient Details:  Nancy Bell Jan 24, 1925 [id number]  Ortho Devices Type of Ortho Device: Arm sling Ortho Device/Splint Location: LUE Ortho Device/Splint Interventions: Application   Post Interventions Patient Tolerated: Well  Nancy Bell [name] 05/14/2024, 2:40 PM

## 2024-05-14 NOTE — Progress Notes (Addendum)
 " PROGRESS NOTE    Nancy Bell  FMW:[id number] DOB: [birth year] DOA: 05/13/2024 PCP: Pia Kerney SQUIBB, MD    Chief Complaint  Patient presents with   Fall    Brief Narrative:  Nancy Bell is a 89 y.o. female with medical history significant for advanced dementia, hypertension and anxiety being admitted to the hospital with traumatic left-sided pneumothorax.    Assessment & Plan:   Principal Problem:   Pneumothorax on left Active Problems:   Anxiety disorder   Essential hypertension   Dementia with behavioral disturbance (HCC)   Sundowning   Gastroesophageal reflux disease   Fall  #1 traumatic left-sided pneumothorax -Secondary to fall, noted after patient found on ground at facility. - Chest tube placed. - Patient on IV fentanyl  as needed. - Patient seen in consultation by pulmonary who are managing chest tube. - Place on scheduled IV Toradol  15 mg every 8 hours x 3 days. - IV Robaxin  as needed. - Oxycodone  as needed. - Senokot-S twice daily. - Per pulmonary/PCCM.  2.  Agitation/??  Sundowning -It is noted that patient has agitation at facility, admitting physician states family was concerned about possible UTI. - Patient with a leukocytosis likely reactive secondary to problem #1. - Urinalysis pending. - Place on Zyprexa  2.5 mg daily at 5/6 PM.  3.  Unwitnessed fall -CT head CT C-spine negative for any acute abnormalities. - PT/OT.  4.  GERD -PPI.  5.  Dementia -Continue Cymbalta , Remeron , Ativan  as needed for anxiety. - Due to agitation/concern for sundowning will start patient on Zyprexa  2.5 mg daily at 5:06 PM.  6.  Hypertension -Continue home regimen propranolol .  7.  Minimally displaced distal left clavicular fracture -Likely secondary to fall. - Left upper extremity sling. - Outpatient follow-up.  DVT prophylaxis: Lovenox  Code Status: DNR limited Family Communication: Updated daughters at bedside. Disposition: Likely back to memory care  unit once cleared by pulmonary.  Status is: Inpatient The patient will require care spanning > 2 midnights and should be moved to inpatient because: Severity of illness.   Consultants:  PCCM/pulmonary: Dr. Shelah 05/13/2024  Procedures:  CT head CT C-spine 05/13/2024 Chest x-ray 05/13/2024, 05/14/2024   Antimicrobials:  Anti-infectives (From admission, onward)    None         Subjective: Patient resting comfortably.  Daughter is at bedside.  Objective: Vitals:   05/14/24 1500 05/14/24 1600 05/14/24 1700 05/14/24 1714  BP: 105/61 (!) 89/76 (!) 132/112   Pulse: 65 74 77 83  Resp: 14 17 15  (!) 21  Temp:      TempSrc:      SpO2: 98% 97% 95% 94%  Weight:      Height:       No intake or output data in the 24 hours ending 05/14/24 1734  Filed Weights   05/13/24 1223  Weight: 53.4 kg    Examination:  General exam: NAD. Respiratory system: Clear to auscultation anterior lung fields.  No wheezes, no crackles, no rhonchi.SABRA Respiratory effort normal.  Left-sided chest tube in place. Cardiovascular system: S1 & S2 heard, RRR. No JVD, murmurs, rubs, gallops or clicks. No pedal edema. Gastrointestinal system: Abdomen is nondistended, soft and nontender. No organomegaly or masses felt. Normal bowel sounds heard. Central nervous system: Sleeping.  Moving extremities spontaneously.  No focal neurological deficits.   Extremities: Symmetric 5 x 5 power. Skin: No rashes, lesions or ulcers Psychiatry: Judgement and insight unable to assess.. Mood & affect unable to assess.  Data Reviewed: I have personally reviewed following labs and imaging studies  CBC: Recent Labs  Lab 05/13/24 1522 05/14/24 0417  WBC 14.5* 11.8*  NEUTROABS 12.1*  --   HGB 15.0 13.3  HCT 46.4* 41.0  MCV 88.7 88.7  PLT 255 204    Basic Metabolic Panel: Recent Labs  Lab 05/13/24 1522 05/14/24 0417  NA 143 142  K 4.1 3.9  CL 108 109  CO2 25 23  GLUCOSE 127* 118*  BUN 14 15  CREATININE 0.83 0.78   CALCIUM 9.3 8.7*    GFR: Estimated Creatinine Clearance: 33.3 mL/min (by C-G formula based on SCr of 0.78 mg/dL).  Liver Function Tests: No results for input(s): AST, ALT, ALKPHOS, BILITOT, PROT, ALBUMIN in the last 168 hours.  CBG: No results for input(s): GLUCAP in the last 168 hours.   No results found for this or any previous visit (from the past 240 hours).       Radiology Studies: DG Chest Port 1 View Result Date: 05/14/2024 EXAM: 1 VIEW(S) XRAY OF THE CHEST 05/14/2024 07:16:00 AM COMPARISON: 05/13/2024 CLINICAL HISTORY: Pneumothorax on left. FINDINGS: LINES, TUBES AND DEVICES: Left pigtail thoracostomy tube stable in position. LUNGS AND PLEURA: Trace residual left apical pneumothorax, unchanged. Small left pleural effusion and retrocardiac opacity, stable. HEART AND MEDIASTINUM: Mild cardiomegaly. Aortic arch atherosclerosis. BONES AND SOFT TISSUES: Distal left clavicle fracture. IMPRESSION: 1. Trace residual left apical pneumothorax, unchanged. 2. Small left pleural effusion and retrocardiac opacity, stable. 3. Left pigtail thoracostomy tube stable in position. 4. Distal left clavicle fracture. 5. Mild cardiomegaly and aortic arch atherosclerosis. Electronically signed by: Evalene Coho MD 05/14/2024 07:43 AM EST RP Workstation: [id number]   DG Chest Port 1 View Result Date: 05/13/2024 EXAM: 1 VIEW(S) XRAY OF THE CHEST 05/13/2024 06:23:00 PM COMPARISON: Comparison with 05/13/2024. CLINICAL HISTORY: new pain and low O2 sat after transfer post pigtail FINDINGS: LINES, TUBES AND DEVICES: A left chest tube is in place. LUNGS AND PLEURA: Shallow inspiration. Suggestion of a small residual left apical pneumothorax. Small left pleural effusion. Atelectasis in the lung bases. HEART AND MEDIASTINUM: Heart size is normal. Calcification of the aorta. BONES AND SOFT TISSUES: Minimally displaced fracture of the distal left clavicle, stable since prior study. Degenerative changes  in the spine. Mild subcutaneous emphysema in the left chest wall. Surgical clips in the left axilla. IMPRESSION: 1. Suggestion of a small residual left apical pneumothorax, no change since prior study. 2. Small left pleural effusion, no change since prior study. 3. Bibasilar atelectasis. 4. Mild subcutaneous emphysema in the left chest wall. 5. Minimally displaced fracture of the distal left clavicle, no change since prior study. Electronically signed by: Elsie Gravely MD 05/13/2024 06:31 PM EST RP Workstation: [id number]   CT Cervical Spine Wo Contrast Result Date: 05/13/2024 CLINICAL DATA:  Fall EXAM: CT CERVICAL SPINE WITHOUT CONTRAST TECHNIQUE: Multidetector CT imaging of the cervical spine was performed without intravenous contrast. Multiplanar CT image reconstructions were also generated. RADIATION DOSE REDUCTION: This exam was performed according to the departmental dose-optimization program which includes automated exposure control, adjustment of the mA and/or kV according to patient size and/or use of iterative reconstruction technique. COMPARISON:  Chest x-ray 05/13/2024, CT 08/14/2023 FINDINGS: Alignment: Motion degraded examination, facet alignment grossly maintained. Straightening of the cervical spine. Skull base and vertebrae: Limited fracture assessment due to significant motion degradation. Vertebral body heights are grossly normal Soft tissues and spinal canal: No prevertebral fluid or swelling. No visible canal hematoma. Disc levels: Moderate disc space  narrowing C5-C6 and C6-C7. Multilevel hypertrophic facet degenerative changes. Upper chest: Left apical pneumothorax Other: None IMPRESSION: 1. Significantly motion degraded examination limiting assessment for fracture. 2. Left apical pneumothorax, see chest x-ray 05/13/2024 Electronically Signed   By: Luke Bun M.D.   On: 05/13/2024 18:14   CT Head Wo Contrast Result Date: 05/13/2024 CLINICAL DATA:  Head trauma EXAM: CT HEAD WITHOUT  CONTRAST TECHNIQUE: Contiguous axial images were obtained from the base of the skull through the vertex without intravenous contrast. RADIATION DOSE REDUCTION: This exam was performed according to the departmental dose-optimization program which includes automated exposure control, adjustment of the mA and/or kV according to patient size and/or use of iterative reconstruction technique. COMPARISON:  MRI 10/11/2023, CT brain 10/11/2023 FINDINGS: Brain: No acute territorial infarction, hemorrhage or intracranial mass. Atrophy and chronic small vessel ischemic changes of the white matter. Stable ventricle size. Vascular: No hyperdense vessels.  No unexpected calcification Skull: Normal. Negative for fracture or focal lesion. Sinuses/Orbits: No acute finding. Other: None IMPRESSION: 1. No CT evidence for acute intracranial abnormality. 2. Atrophy and chronic small vessel ischemic changes of the white matter. Electronically Signed   By: Luke Bun M.D.   On: 05/13/2024 18:09   DG Chest Portable 1 View Result Date: 05/13/2024 CLINICAL DATA:  Status post chest tube placement EXAM: PORTABLE CHEST 1 VIEW COMPARISON:  Same day FINDINGS: Interval placement of left-sided chest tube. Small left apical pneumothorax is noted which is significantly smaller compared to prior exam. Minimal subcutaneous emphysema is seen over left lateral chest wall. IMPRESSION: Interval placement of left-sided chest tube. Small left apical pneumothorax is noted which is significantly smaller compared to prior exam. Electronically Signed   By: Lynwood Landy Raddle M.D.   On: 05/13/2024 17:22   DG Knee Complete 4 Views Left Result Date: 05/13/2024 CLINICAL DATA:  Left knee pain after fall yesterday EXAM: LEFT KNEE - COMPLETE 4+ VIEW COMPARISON:  None Available. FINDINGS: No evidence of fracture, dislocation, or joint effusion. No evidence of arthropathy or other focal bone abnormality. Soft tissues are unremarkable. IMPRESSION: Negative.  Electronically Signed   By: Lynwood Landy Raddle M.D.   On: 05/13/2024 14:06   DG Pelvis 1-2 Views Result Date: 05/13/2024 CLINICAL DATA:  Pelvic pain after fall yesterday. EXAM: PELVIS - 1-2 VIEW COMPARISON:  July 18, 2023 FINDINGS: There is no evidence of pelvic fracture or diastasis. No pelvic bone lesions are seen. Calcified uterine fibroid is again noted. IMPRESSION: No acute abnormality seen. Electronically Signed   By: Lynwood Landy Raddle M.D.   On: 05/13/2024 14:05   DG Chest Portable 1 View Result Date: 05/13/2024 CLINICAL DATA:  Chest pain after fall yesterday. EXAM: PORTABLE CHEST 1 VIEW COMPARISON:  October 11, 2023 FINDINGS: Interval development of large left pneumothorax. Stable cardiomediastinal silhouette. Right lung is clear. Bony thorax is unremarkable. IMPRESSION: Interval development of large left pneumothorax. Critical Value/emergent results were called by telephone at the time of interpretation on 05/13/2024 at 2:03 pm to provider JAYSON PEREYRA , who verbally acknowledged these results. Electronically Signed   By: Lynwood Landy Raddle M.D.   On: 05/13/2024 14:03        Scheduled Meds:  DULoxetine   30 mg Oral Daily   enoxaparin  (LOVENOX ) injection  40 mg Subcutaneous Q24H   ketorolac   15 mg Intravenous Q8H   mirtazapine   15 mg Oral QHS   OLANZapine  zydis  2.5 mg Oral Daily   pantoprazole   40 mg Oral Daily   propranolol   40 mg Oral BID   senna-docusate  1 tablet Oral BID   tamsulosin   0.4 mg Oral QPC supper   Continuous Infusions:  sodium chloride  75 mL/hr at 05/14/24 1413     LOS: 0 days    Time spent: 40 minutes    Toribio Hummer, MD [facility]   To contact the attending provider between 7A-7P or the covering provider during after hours 7P-7A, please log into the web site [email or web address] and access using universal [place] password for that web site. If you do not have the password, please call the hospital operator.  05/14/2024, 5:34 PM    "

## 2024-05-14 NOTE — Progress Notes (Signed)
 Patient alert to self. Son-in-law at bedside. Pain controlled with PRN pain medications. Tolerated sips of Boost liquid nutritional drink. Chest tube to ?20 cm suction, intact, with 40 mL output noted during shift. Plan of care ongoing.

## 2024-05-15 ENCOUNTER — Encounter (HOSPITAL_COMMUNITY): Payer: Self-pay | Admitting: Internal Medicine

## 2024-05-15 ENCOUNTER — Other Ambulatory Visit: Payer: Self-pay

## 2024-05-15 ENCOUNTER — Inpatient Hospital Stay (HOSPITAL_COMMUNITY)

## 2024-05-15 DIAGNOSIS — W19XXXD Unspecified fall, subsequent encounter: Secondary | ICD-10-CM | POA: Diagnosis not present

## 2024-05-15 DIAGNOSIS — I10 Essential (primary) hypertension: Secondary | ICD-10-CM | POA: Diagnosis not present

## 2024-05-15 DIAGNOSIS — K219 Gastro-esophageal reflux disease without esophagitis: Secondary | ICD-10-CM | POA: Diagnosis not present

## 2024-05-15 DIAGNOSIS — F05 Delirium due to known physiological condition: Secondary | ICD-10-CM | POA: Diagnosis not present

## 2024-05-15 DIAGNOSIS — J939 Pneumothorax, unspecified: Secondary | ICD-10-CM | POA: Diagnosis not present

## 2024-05-15 DIAGNOSIS — F419 Anxiety disorder, unspecified: Secondary | ICD-10-CM | POA: Diagnosis not present

## 2024-05-15 DIAGNOSIS — F03918 Unspecified dementia, unspecified severity, with other behavioral disturbance: Secondary | ICD-10-CM | POA: Diagnosis not present

## 2024-05-15 LAB — URINALYSIS, COMPLETE (UACMP) WITH MICROSCOPIC
Bilirubin Urine: NEGATIVE
Glucose, UA: NEGATIVE mg/dL
Hgb urine dipstick: NEGATIVE
Ketones, ur: 5 mg/dL — AB
Leukocytes,Ua: NEGATIVE
Nitrite: NEGATIVE
Protein, ur: NEGATIVE mg/dL
Specific Gravity, Urine: 1.029 (ref 1.005–1.030)
pH: 5 (ref 5.0–8.0)

## 2024-05-15 LAB — CBC WITH DIFFERENTIAL/PLATELET
Abs Immature Granulocytes: 0.04 K/uL (ref 0.00–0.07)
Basophils Absolute: 0 K/uL (ref 0.0–0.1)
Basophils Relative: 1 %
Eosinophils Absolute: 0.2 K/uL (ref 0.0–0.5)
Eosinophils Relative: 2 %
HCT: 35.4 % — ABNORMAL LOW (ref 36.0–46.0)
Hemoglobin: 11.3 g/dL — ABNORMAL LOW (ref 12.0–15.0)
Immature Granulocytes: 1 %
Lymphocytes Relative: 24 %
Lymphs Abs: 1.7 K/uL (ref 0.7–4.0)
MCH: 28.8 pg (ref 26.0–34.0)
MCHC: 31.9 g/dL (ref 30.0–36.0)
MCV: 90.1 fL (ref 80.0–100.0)
Monocytes Absolute: 0.6 K/uL (ref 0.1–1.0)
Monocytes Relative: 9 %
Neutro Abs: 4.7 K/uL (ref 1.7–7.7)
Neutrophils Relative %: 63 %
Platelets: 156 K/uL (ref 150–400)
RBC: 3.93 MIL/uL (ref 3.87–5.11)
RDW: 13.2 % (ref 11.5–15.5)
WBC: 7.3 K/uL (ref 4.0–10.5)
nRBC: 0 % (ref 0.0–0.2)

## 2024-05-15 LAB — BASIC METABOLIC PANEL WITH GFR
Anion gap: 8 (ref 5–15)
BUN: 19 mg/dL (ref 8–23)
CO2: 23 mmol/L (ref 22–32)
Calcium: 8.1 mg/dL — ABNORMAL LOW (ref 8.9–10.3)
Chloride: 112 mmol/L — ABNORMAL HIGH (ref 98–111)
Creatinine, Ser: 0.7 mg/dL (ref 0.44–1.00)
GFR, Estimated: >60 mL/min
Glucose, Bld: 76 mg/dL (ref 70–99)
Potassium: 3.4 mmol/L — ABNORMAL LOW (ref 3.5–5.1)
Sodium: 143 mmol/L (ref 135–145)

## 2024-05-15 MED ORDER — SODIUM CHLORIDE 0.9 % IV BOLUS: 1000.0000 mL | Freq: Once | INTRAVENOUS | Status: DC

## 2024-05-15 MED ORDER — POTASSIUM CHLORIDE 10 MEQ/100ML IV SOLN
10.0000 meq | INTRAVENOUS | Status: AC
  Administered 2024-05-15 (×2): 10 meq via INTRAVENOUS
  Filled 2024-05-15 (×2): qty 100

## 2024-05-15 NOTE — TOC Transition Note (Signed)
 Transition of Care [facility]) - Discharge Note   Patient Details  Name: Nancy Bell MRN: [id number] Date of Birth: 28-Aug-1924  Transition of Care [facility] CM/SW Contact:  Tawni [name], LCSW Phone Number: 05/15/2024, 2:08 PM   Clinical Narrative:     CSW spoke with the pts daughter. The pt is from [facility]. The pts daughter discussed her concerns regarding the facility and reported that she does not want her mother to return there. She stated that she will private pay for her mothers care. CSW explained that the pt has a pending PT evaluation to determine the recommended level of care. CSW suggested waiting for the PT recommendations and proceeding from there. ICM to follow.     Barriers to Discharge: Continued Medical Work up   Patient Goals and CMS Choice            Discharge Placement                       Discharge Plan and Services Additional resources added to the After Visit Summary for                                       Social Drivers of Health (SDOH) Interventions SDOH Screenings   Food Insecurity: Low Risk (09/01/2023)   Received from [facility]  Housing: Low Risk (09/01/2023)   Received from [facility]  Transportation Needs: No Transportation Needs (09/01/2023)   Received from [facility]  Utilities: Low Risk (09/01/2023)   Received from [facility]  Tobacco Use: Low Risk (09/21/2023)   Received from [facility]     Readmission Risk Interventions     No data to display

## 2024-05-15 NOTE — Progress Notes (Signed)
 " PROGRESS NOTE    Nancy Bell  FMW:[id number] DOB: 1924/11/05 DOA: 05/13/2024 PCP: Pia Kerney SQUIBB, MD    Chief Complaint  Patient presents with   Fall    Brief Narrative:  Nancy Bell is a 89 y.o. female with medical history significant for advanced dementia, hypertension and anxiety being admitted to the hospital with traumatic left-sided pneumothorax.    Assessment & Plan:   Principal Problem:   Pneumothorax on left Active Problems:   Anxiety disorder   Essential hypertension   Dementia with behavioral disturbance (HCC)   Sundowning   Gastroesophageal reflux disease   Fall  #1 traumatic left-sided pneumothorax -Secondary to fall, noted after patient found on ground at facility. - Chest tube placed. - Patient on IV fentanyl  as needed. - Patient seen in consultation by pulmonary who are managing chest tube. - Continue scheduled IV Toradol  15 mg every 8 hours x 3 days. - IV Robaxin  as needed. - Oxycodone  as needed. - Senokot-S twice daily. -Per pulmonary no air leak on waterseal, recommending repeat chest x-ray this afternoon and potentially discontinuing chest tube. - Per pulmonary/PCCM.  2.  Agitation/??  Sundowning -It is noted that patient has agitation at facility, admitting physician states family was concerned about possible UTI. - Patient with a leukocytosis likely reactive secondary to problem #1. -Leukocytosis trending down. -Urinalysis nitrite negative, leukocytes negative. -Zyprexa  2.5 mg daily at 5/6pm.  3.  Unwitnessed fall -CT head CT C-spine negative for any acute abnormalities. - PT/OT.  4.  GERD - Continue PPI.  5.  Dementia -Continue Cymbalta , Remeron , Ativan  as needed for anxiety. - Due to agitation/concern for sundowning patient started on Zyprexa  2.5 mg daily in the evenings.   6.  Hypertension - Propranolol .    7.  Minimally displaced distal left clavicular fracture -Likely secondary to fall. - Left upper extremity sling. -  Outpatient follow-up.  8.  Hypokalemia -Replete.  DVT prophylaxis: Lovenox  Code Status: DNR limited Family Communication: Updated daughters at bedside. Disposition: Likely back to memory care unit once cleared by pulmonary.  Daughters do not want patient to go back to memory care unit she came from.  TOC consulted  Status is: Inpatient The patient will require care spanning > 2 midnights and should be moved to inpatient because: Severity of illness.   Consultants:  PCCM/pulmonary: Dr. Shelah 05/13/2024  Procedures:  CT head CT C-spine 05/13/2024 Chest x-ray 05/13/2024, 05/14/2024   Antimicrobials:  Anti-infectives (From admission, onward)    None         Subjective: Patient laying in bed, patient with complaints of left-sided chest pain.  Denies any significant shortness of breath.  Denies any abdominal pain.  Daughters at bedside.   Objective: Vitals:   05/15/24 0408 05/15/24 0500 05/15/24 0700 05/15/24 0800  BP:   (!) 122/57 (!) 117/59  Pulse:   (!) 51 (!) 47  Resp: 20  13 16   Temp:  98.5 F (36.9 C)    TempSrc:  Oral    SpO2:   99% 98%  Weight:      Height:        Intake/Output Summary (Last 24 hours) at 05/15/2024 1224 Last data filed at 05/15/2024 0800 Gross per 24 hour  Intake 1273.85 ml  Output 44 ml  Net 1229.85 ml    Filed Weights   05/13/24 1223  Weight: 53.4 kg    Examination:  General exam: NAD.  Left upper extremity in sling. Respiratory system: CTAB anterior lung fields.  No wheezes, no crackles, no rhonchi.  Fair air movement.  Left-sided chest tube in place.   Cardiovascular system: Regular rate and rhythm no murmurs rubs or gallops.  No JVD.  No pitting lower extremity edema.  Gastrointestinal system: Abdomen is soft, nontender, nondistended, positive bowel sounds.  No rebound.  No guarding.  Central nervous system: Awake, alert.  Moving extremities spontaneously.  No focal neurological deficits.  Extremities: Symmetric 5 x 5 power. Skin: No  rashes, lesions or ulcers Psychiatry: Judgement and insight fair to normal.  Mood and affect appropriate.     Data Reviewed: I have personally reviewed following labs and imaging studies  CBC: Recent Labs  Lab 05/13/24 1522 05/14/24 0417 05/15/24 0346  WBC 14.5* 11.8* 7.3  NEUTROABS 12.1*  --  4.7  HGB 15.0 13.3 11.3*  HCT 46.4* 41.0 35.4*  MCV 88.7 88.7 90.1  PLT 255 204 156    Basic Metabolic Panel: Recent Labs  Lab 05/13/24 1522 05/14/24 0417 05/15/24 0346  NA 143 142 143  K 4.1 3.9 3.4*  CL 108 109 112*  CO2 25 23 23   GLUCOSE 127* 118* 76  BUN 14 15 19   CREATININE 0.83 0.78 0.70  CALCIUM 9.3 8.7* 8.1*    GFR: Estimated Creatinine Clearance: 33.3 mL/min (by C-G formula based on SCr of 0.7 mg/dL).  Liver Function Tests: No results for input(s): AST, ALT, ALKPHOS, BILITOT, PROT, ALBUMIN in the last 168 hours.  CBG: No results for input(s): GLUCAP in the last 168 hours.   No results found for this or any previous visit (from the past 240 hours).       Radiology Studies: DG CHEST PORT 1 VIEW Result Date: 05/14/2024 EXAM: 1 VIEW(S) XRAY OF THE CHEST 05/14/2024 07:42:00 PM COMPARISON: 05/14/2024 CLINICAL HISTORY: [id number] Pneumothorax on left [id number] Pneumothorax on left FINDINGS: LINES, TUBES AND DEVICES: Left pigtail chest tube remains in place, unchanged. LUNGS AND PLEURA: Left base atelectasis. Small left pleural effusion. No definite residual pneumothorax. HEART AND MEDIASTINUM: Aortic atherosclerosis. No acute abnormality of the cardiac and mediastinal silhouettes. BONES AND SOFT TISSUES: No acute osseous abnormality. IMPRESSION: 1. Left pigtail chest tube remains in place, unchanged. No definite residual pneumothorax. 2. Left base atelectasis and small left pleural effusion. Electronically signed by: Franky Crease MD 05/14/2024 07:57 PM EST RP Workstation: [id number]   DG Chest Port 1 View Result Date: 05/14/2024 EXAM: 1 VIEW(S) XRAY OF THE CHEST  05/14/2024 07:16:00 AM COMPARISON: 05/13/2024 CLINICAL HISTORY: Pneumothorax on left. FINDINGS: LINES, TUBES AND DEVICES: Left pigtail thoracostomy tube stable in position. LUNGS AND PLEURA: Trace residual left apical pneumothorax, unchanged. Small left pleural effusion and retrocardiac opacity, stable. HEART AND MEDIASTINUM: Mild cardiomegaly. Aortic arch atherosclerosis. BONES AND SOFT TISSUES: Distal left clavicle fracture. IMPRESSION: 1. Trace residual left apical pneumothorax, unchanged. 2. Small left pleural effusion and retrocardiac opacity, stable. 3. Left pigtail thoracostomy tube stable in position. 4. Distal left clavicle fracture. 5. Mild cardiomegaly and aortic arch atherosclerosis. Electronically signed by: Evalene Coho MD 05/14/2024 07:43 AM EST RP Workstation: [id number]   DG Chest Port 1 View Result Date: 05/13/2024 EXAM: 1 VIEW(S) XRAY OF THE CHEST 05/13/2024 06:23:00 PM COMPARISON: Comparison with 05/13/2024. CLINICAL HISTORY: new pain and low O2 sat after transfer post pigtail FINDINGS: LINES, TUBES AND DEVICES: A left chest tube is in place. LUNGS AND PLEURA: Shallow inspiration. Suggestion of a small residual left apical pneumothorax. Small left pleural effusion. Atelectasis in the lung bases. HEART AND MEDIASTINUM: Heart size is normal. Calcification  of the aorta. BONES AND SOFT TISSUES: Minimally displaced fracture of the distal left clavicle, stable since prior study. Degenerative changes in the spine. Mild subcutaneous emphysema in the left chest wall. Surgical clips in the left axilla. IMPRESSION: 1. Suggestion of a small residual left apical pneumothorax, no change since prior study. 2. Small left pleural effusion, no change since prior study. 3. Bibasilar atelectasis. 4. Mild subcutaneous emphysema in the left chest wall. 5. Minimally displaced fracture of the distal left clavicle, no change since prior study. Electronically signed by: Elsie Gravely MD 05/13/2024 06:31 PM EST RP  Workstation: [id number]   CT Cervical Spine Wo Contrast Result Date: 05/13/2024 CLINICAL DATA:  Fall EXAM: CT CERVICAL SPINE WITHOUT CONTRAST TECHNIQUE: Multidetector CT imaging of the cervical spine was performed without intravenous contrast. Multiplanar CT image reconstructions were also generated. RADIATION DOSE REDUCTION: This exam was performed according to the departmental dose-optimization program which includes automated exposure control, adjustment of the mA and/or kV according to patient size and/or use of iterative reconstruction technique. COMPARISON:  Chest x-ray 05/13/2024, CT 08/14/2023 FINDINGS: Alignment: Motion degraded examination, facet alignment grossly maintained. Straightening of the cervical spine. Skull base and vertebrae: Limited fracture assessment due to significant motion degradation. Vertebral body heights are grossly normal Soft tissues and spinal canal: No prevertebral fluid or swelling. No visible canal hematoma. Disc levels: Moderate disc space narrowing C5-C6 and C6-C7. Multilevel hypertrophic facet degenerative changes. Upper chest: Left apical pneumothorax Other: None IMPRESSION: 1. Significantly motion degraded examination limiting assessment for fracture. 2. Left apical pneumothorax, see chest x-ray 05/13/2024 Electronically Signed   By: Luke Bun M.D.   On: 05/13/2024 18:14   CT Head Wo Contrast Result Date: 05/13/2024 CLINICAL DATA:  Head trauma EXAM: CT HEAD WITHOUT CONTRAST TECHNIQUE: Contiguous axial images were obtained from the base of the skull through the vertex without intravenous contrast. RADIATION DOSE REDUCTION: This exam was performed according to the departmental dose-optimization program which includes automated exposure control, adjustment of the mA and/or kV according to patient size and/or use of iterative reconstruction technique. COMPARISON:  MRI 10/11/2023, CT brain 10/11/2023 FINDINGS: Brain: No acute territorial infarction, hemorrhage or  intracranial mass. Atrophy and chronic small vessel ischemic changes of the white matter. Stable ventricle size. Vascular: No hyperdense vessels.  No unexpected calcification Skull: Normal. Negative for fracture or focal lesion. Sinuses/Orbits: No acute finding. Other: None IMPRESSION: 1. No CT evidence for acute intracranial abnormality. 2. Atrophy and chronic small vessel ischemic changes of the white matter. Electronically Signed   By: Luke Bun M.D.   On: 05/13/2024 18:09   DG Chest Portable 1 View Result Date: 05/13/2024 CLINICAL DATA:  Status post chest tube placement EXAM: PORTABLE CHEST 1 VIEW COMPARISON:  Same day FINDINGS: Interval placement of left-sided chest tube. Small left apical pneumothorax is noted which is significantly smaller compared to prior exam. Minimal subcutaneous emphysema is seen over left lateral chest wall. IMPRESSION: Interval placement of left-sided chest tube. Small left apical pneumothorax is noted which is significantly smaller compared to prior exam. Electronically Signed   By: Lynwood Landy Raddle M.D.   On: 05/13/2024 17:22   DG Knee Complete 4 Views Left Result Date: 05/13/2024 CLINICAL DATA:  Left knee pain after fall yesterday EXAM: LEFT KNEE - COMPLETE 4+ VIEW COMPARISON:  None Available. FINDINGS: No evidence of fracture, dislocation, or joint effusion. No evidence of arthropathy or other focal bone abnormality. Soft tissues are unremarkable. IMPRESSION: Negative. Electronically Signed   By: Lynwood Landy  Jr M.D.   On: 05/13/2024 14:06   DG Pelvis 1-2 Views Result Date: 05/13/2024 CLINICAL DATA:  Pelvic pain after fall yesterday. EXAM: PELVIS - 1-2 VIEW COMPARISON:  July 18, 2023 FINDINGS: There is no evidence of pelvic fracture or diastasis. No pelvic bone lesions are seen. Calcified uterine fibroid is again noted. IMPRESSION: No acute abnormality seen. Electronically Signed   By: Lynwood Landy Raddle M.D.   On: 05/13/2024 14:05   DG Chest Portable 1 View Result Date:  05/13/2024 CLINICAL DATA:  Chest pain after fall yesterday. EXAM: PORTABLE CHEST 1 VIEW COMPARISON:  October 11, 2023 FINDINGS: Interval development of large left pneumothorax. Stable cardiomediastinal silhouette. Right lung is clear. Bony thorax is unremarkable. IMPRESSION: Interval development of large left pneumothorax. Critical Value/emergent results were called by telephone at the time of interpretation on 05/13/2024 at 2:03 pm to provider JAYSON PEREYRA , who verbally acknowledged these results. Electronically Signed   By: Lynwood Landy Raddle M.D.   On: 05/13/2024 14:03        Scheduled Meds:  DULoxetine   30 mg Oral Daily   enoxaparin  (LOVENOX ) injection  40 mg Subcutaneous Q24H   ketorolac   15 mg Intravenous Q8H   mirtazapine   15 mg Oral QHS   OLANZapine  zydis  2.5 mg Oral Daily   pantoprazole   40 mg Oral Daily   propranolol   40 mg Oral BID   senna-docusate  1 tablet Oral BID   tamsulosin   0.4 mg Oral QPC supper   Continuous Infusions:  sodium chloride  100 mL/hr at 05/15/24 1034   potassium chloride  10 mEq (05/15/24 1158)     LOS: 1 day    Time spent: 35 minutes    Toribio Hummer, MD [facility]   To contact the attending provider between 7A-7P or the covering provider during after hours 7P-7A, please log into the web site [email or web address] and access using universal [place] password for that web site. If you do not have the password, please call the hospital operator.  05/15/2024, 12:24 PM    "

## 2024-05-15 NOTE — Progress Notes (Signed)
 PT Cancellation Note  Patient Details Name: Nancy Bell MRN: [id number] DOB: 12/16/1924   Cancelled Treatment:    Reason Eval/Treat Not Completed: Medical issues which prohibited therapy Waiting for CT to be removed and cleared. Darice Potters PT Acute Rehabilitation Services Office [phone]   Potters Darice Norris 05/15/2024, 1:50 PM

## 2024-05-15 NOTE — Progress Notes (Signed)
 Order to discontinue chest tube. Chest tube removed at 1528 with bedside nurse in room. Occlusive dressing placed over site immediately on removal and reinforced with tape. Oxygen 99% after removal. Discussed indications to call provider and rapid including increased chest pain, shortness of breath, changes in heart rate and decreasing oxygen.  Family at bedside at time of removal

## 2024-05-15 NOTE — Progress Notes (Signed)
 "  NAME:  Nancy Bell, MRN:  [id number], DOB:  Feb 25, 1925, LOS: 1 ADMISSION DATE:  05/13/2024, CONSULTATION DATE: 05/13/2024 REFERRING MD: Dr. Charlyn, CHIEF COMPLAINT:  pneumothorax   History of Present Illness:  89 year old woman with a history of breast cancer, dementia and memory loss residing in a memory care assisted living.  She apparently experienced a fall on 1/2, does not remember the details.  Began to complain of back pain on 1/3 as well as left shoulder pain and tenderness.  Chest x-ray in the ED revealed a large left pneumothorax.  Pigtail catheter being placed currently.  Pertinent  Medical History   Past Medical History:  Diagnosis Date   Anxiety    Cancer (HCC)    right breast   History of hiatal hernia    Hyperlipidemia    Hypertension      Significant Hospital Events: Including procedures, antibiotic start and stop dates in addition to other pertinent events   1/3 pelvis XR >> normal 1/3 L knee XR > no evidence fracture or dislocation 1/3 head CT >  1/3 C-spine CT >  1/3 L pigtail chest tube placed in ED  Interim History / Subjective:   Sleepy, denies pain Daughter is at bedside On 4 L nasal cannula  Objective    Blood pressure (!) 117/59, pulse (!) 47, temperature 98.5 F (36.9 C), temperature source Oral, resp. rate 16, height 5' 3 (1.6 m), weight 53.4 kg, SpO2 98%.        Intake/Output Summary (Last 24 hours) at 05/15/2024 1041 Last data filed at 05/15/2024 0800 Gross per 24 hour  Intake 1273.85 ml  Output 44 ml  Net 1229.85 ml   Filed Weights   05/13/24 1223  Weight: 53.4 kg    Examination: General: Thin elderly woman in no distress Lungs: Clear breath sounds, air entry on left, no airleak on pigtail Cardiovascular: Regular, no murmur Abdomen: Nondistended with positive bowel sounds Extremities: No edema Neuro: Sleepy but arouses easily, follows commands  Chest x-ray on 1/4 and 1/5 shows expanded left lung, no pneumothorax  Resolved  problem list   Assessment and Plan   Traumatic left pneumothorax following mechanical fall - No airleak on waterseal .  I flushed pigtail and confirmed  -Can discontinue chest tube. Repeat chest x-ray in afternoon  Updated nursing and daughters at bedside  Labs   CBC: Recent Labs  Lab 05/13/24 1522 05/14/24 0417 05/15/24 0346  WBC 14.5* 11.8* 7.3  NEUTROABS 12.1*  --  4.7  HGB 15.0 13.3 11.3*  HCT 46.4* 41.0 35.4*  MCV 88.7 88.7 90.1  PLT 255 204 156    Basic Metabolic Panel: Recent Labs  Lab 05/13/24 1522 05/14/24 0417 05/15/24 0346  NA 143 142 143  K 4.1 3.9 3.4*  CL 108 109 112*  CO2 25 23 23   GLUCOSE 127* 118* 76  BUN 14 15 19   CREATININE 0.83 0.78 0.70  CALCIUM 9.3 8.7* 8.1*   GFR: Estimated Creatinine Clearance: 33.3 mL/min (by C-G formula based on SCr of 0.7 mg/dL). Recent Labs  Lab 05/13/24 1522 05/14/24 0417 05/15/24 0346  WBC 14.5* 11.8* 7.3    Liver Function Tests: No results for input(s): AST, ALT, ALKPHOS, BILITOT, PROT, ALBUMIN in the last 168 hours. No results for input(s): LIPASE, AMYLASE in the last 168 hours. No results for input(s): AMMONIA in the last 168 hours.  ABG    Component Value Date/Time   TCO2 25 10/11/2023 1640     Coagulation Profile:  No results for input(s): INR, PROTIME in the last 168 hours.  Cardiac Enzymes: Recent Labs  Lab 05/13/24 1522  CKTOTAL 95        Kennethia Lynes V. Jude MD 05/15/2024, 10:41 AM  230 2526     "

## 2024-05-15 NOTE — Evaluation (Signed)
 Physical Therapy Evaluation Patient Details Name: Nancy Bell MRN: [id number] DOB: 1924/05/31 Today's Date: 05/15/2024  History of Present Illness  89 year old woman, from [facility],  apparently experienced a fall on 1/2. Began to complain of back pain on 1/3 as well as left shoulder pain and tenderness. Chest x-ray in the ED revealed a large left pneumothorax, Chest tube placed. Minimally displaced Left distal clavicle fracture- sling ordered.DC'd 05/15/24 Prior medical history of breast cancer, dementia and memory loss residing in a memory care assisted living.  Clinical Impression  Pt admitted with above diagnosis.  Pt currently with functional limitations due to the deficits listed below (see PT Problem List). Pt will benefit from acute skilled PT to increase their independence and safety with mobility to allow discharge.     The patient  aroused and  attempted conversation, reduced clarity of speech.Patient's 2 daughters present and provided information: patient resides at Memory care, transfers only, limited self feeds. Family would like increased level of care. Patient presents with significant debility, requiring max/total assistance for mobility and apparent pain associated with L clavicle fracture when pressed on back on left. Patient will benefit from continued inpatient follow up therapy, <3 hours/day.       If plan is discharge home, recommend the following: Two people to help with walking and/or transfers;Two people to help with bathing/dressing/bathroom;Assistance with feeding   Can travel by private vehicle    no    Equipment Recommendations None recommended by PT  Recommendations for Other Services       Functional Status Assessment Patient has had a recent decline in their functional status and/or demonstrates limited ability to make significant improvements in function in a reasonable and predictable amount of time     Precautions / Restrictions  Precautions Precautions: Fall Precaution/Restrictions Comments: LUE sling for  clavicle fx Required Braces or Orthoses: Sling Restrictions Weight Bearing Restrictions Per Provider Order: Yes      Mobility  Bed Mobility Overal bed mobility: Needs Assistance Bed Mobility: Sidelying to Sit, Sit to Sidelying   Sidelying to sit: Total assist, HOB elevated     Sit to sidelying: Total assist, +2 for safety/equipment, +2 for physical assistance General bed mobility comments: bed pad used to craddly trunk to lift to sitting, total assist for legs and trunk + 2  to return to side    Transfers                   General transfer comment: will need lift    Ambulation/Gait                  Stairs            Wheelchair Mobility     Tilt Bed    Modified Rankin (Stroke Patients Only)       Balance Overall balance assessment: Needs assistance, History of Falls Sitting-balance support: Single extremity supported, Feet unsupported Sitting balance-Leahy Scale: Zero Sitting balance - Comments: progressed to min assistance Postural control: Right lateral lean                                   Pertinent Vitals/Pain Pain Assessment Breathing: occasional labored breathing, short period of hyperventilation Negative Vocalization: occasional moan/groan, low speech, negative/disapproving quality Facial Expression: sad, frightened, frown Body Language: tense, distressed pacing, fidgeting Consolability: distracted or reassured by voice/touch PAINAD Score: 5    Home Living Family/patient expects  to be discharged to:: Assisted living                   Additional Comments: mem[facility] care ALF, per dtrs, pivots only,  ? feeds self, they go and help    Prior Function               Mobility Comments: trnasfers to [facility] with staff ADLs Comments: total care     Extremity/Trunk Assessment   Upper Extremity Assessment Upper Extremity Assessment:  Right hand dominant;LUE deficits/detail LUE Deficits / Details: in slong    Lower Extremity Assessment Lower Extremity Assessment: LLE deficits/detail;RLE deficits/detail;Generalized weakness LLE Deficits / Details: knee abrasion    Cervical / Trunk Assessment Cervical / Trunk Assessment: Kyphotic  Communication   Communication Communication: Impaired Factors Affecting Communication: Hearing impaired;Difficulty expressing self;Reduced clarity of speech    Cognition Arousal: Alert Behavior During Therapy: WFL for tasks assessed/performed   PT - Cognitive impairments: History of cognitive impairments                       PT - Cognition Comments: oriented  to self and dtrs Following commands: Impaired       Cueing Cueing Techniques: Gestural cues, Tactile cues     General Comments      Exercises     Assessment/Plan    PT Assessment Patient needs continued PT services  PT Problem List Decreased strength;Decreased range of motion;Decreased safety awareness;Decreased knowledge of precautions;Decreased activity tolerance;Decreased balance;Decreased mobility;Decreased cognition;Pain       PT Treatment Interventions Functional mobility training;Therapeutic activities;Patient/family education    PT Goals (Current goals can be found in the Care Plan section)  Acute Rehab PT Goals Patient Stated Goal: go where she can get more help PT Goal Formulation: With patient/family Time For Goal Achievement: 05/29/24 Potential to Achieve Goals: Poor    Frequency Min 2X/week     Co-evaluation               AM-PAC PT 6 Clicks Mobility  Outcome Measure Help needed turning from your back to your side while in a flat bed without using bedrails?: Total Help needed moving from lying on your back to sitting on the side of a flat bed without using bedrails?: Total Help needed moving to and from a bed to a chair (including a wheelchair)?: Total Help needed standing up  from a chair using your arms (e.g., wheelchair or bedside chair)?: Total Help needed to walk in hospital room?: Total Help needed climbing 3-5 steps with a railing? : Total 6 Click Score: 6    End of Session   Activity Tolerance: Patient tolerated treatment well Patient left: in bed;with call bell/phone within reach;with nursing/sitter in room;with family/visitor present Nurse Communication: Mobility status PT Visit Diagnosis: Unsteadiness on feet (R26.81);Other abnormalities of gait and mobility (R26.89);Muscle weakness (generalized) (M62.81);History of falling (Z91.81)    Time: 8489-8471 PT Time Calculation (min) (ACUTE ONLY): 18 min   Charges:   PT Evaluation $PT Eval Low Complexity: 1 Low   PT General Charges $$ ACUTE PT VISIT: 1 Visit         Darice Potters PT Acute Rehabilitation Services Office [phone]    Potters Darice Norris 05/15/2024, 3:35 PM

## 2024-05-16 ENCOUNTER — Inpatient Hospital Stay (HOSPITAL_COMMUNITY)

## 2024-05-16 DIAGNOSIS — F419 Anxiety disorder, unspecified: Secondary | ICD-10-CM | POA: Diagnosis not present

## 2024-05-16 DIAGNOSIS — J939 Pneumothorax, unspecified: Secondary | ICD-10-CM | POA: Diagnosis not present

## 2024-05-16 DIAGNOSIS — W19XXXD Unspecified fall, subsequent encounter: Secondary | ICD-10-CM | POA: Diagnosis not present

## 2024-05-16 DIAGNOSIS — F05 Delirium due to known physiological condition: Secondary | ICD-10-CM | POA: Diagnosis not present

## 2024-05-16 DIAGNOSIS — F03918 Unspecified dementia, unspecified severity, with other behavioral disturbance: Secondary | ICD-10-CM | POA: Diagnosis not present

## 2024-05-16 DIAGNOSIS — K219 Gastro-esophageal reflux disease without esophagitis: Secondary | ICD-10-CM | POA: Diagnosis not present

## 2024-05-16 DIAGNOSIS — I10 Essential (primary) hypertension: Secondary | ICD-10-CM | POA: Diagnosis not present

## 2024-05-16 LAB — URINE CULTURE: Culture: NO GROWTH

## 2024-05-16 LAB — BASIC METABOLIC PANEL WITH GFR
Anion gap: 13 (ref 5–15)
BUN: 17 mg/dL (ref 8–23)
CO2: 19 mmol/L — ABNORMAL LOW (ref 22–32)
Calcium: 8.3 mg/dL — ABNORMAL LOW (ref 8.9–10.3)
Chloride: 110 mmol/L (ref 98–111)
Creatinine, Ser: 0.68 mg/dL (ref 0.44–1.00)
GFR, Estimated: >60 mL/min
Glucose, Bld: 91 mg/dL (ref 70–99)
Potassium: 4.1 mmol/L (ref 3.5–5.1)
Sodium: 141 mmol/L (ref 135–145)

## 2024-05-16 LAB — CBC
HCT: 36.6 % (ref 36.0–46.0)
Hemoglobin: 12.4 g/dL (ref 12.0–15.0)
MCH: 29.7 pg (ref 26.0–34.0)
MCHC: 33.9 g/dL (ref 30.0–36.0)
MCV: 87.6 fL (ref 80.0–100.0)
Platelets: 146 K/uL — ABNORMAL LOW (ref 150–400)
RBC: 4.18 MIL/uL (ref 3.87–5.11)
RDW: 13.2 % (ref 11.5–15.5)
WBC: 9.1 K/uL (ref 4.0–10.5)
nRBC: 0 % (ref 0.0–0.2)

## 2024-05-16 MED ORDER — SORBITOL 70 % SOLN
30.0000 mL | Freq: Once | Status: DC
  Filled 2024-05-16: qty 30

## 2024-05-16 MED ORDER — SODIUM CHLORIDE 0.9 % IV SOLN: INTRAVENOUS | Status: DC

## 2024-05-16 MED ORDER — OLANZAPINE 5 MG PO TBDP
2.5000 mg | ORAL_TABLET | Freq: Once | ORAL | Status: AC
  Administered 2024-05-16: 2.5 mg via ORAL
  Filled 2024-05-16: qty 0.5

## 2024-05-16 MED ORDER — BISACODYL 5 MG PO TBEC: 5.0000 mg | DELAYED_RELEASE_TABLET | Freq: Once | ORAL | Status: DC

## 2024-05-16 MED ORDER — OLANZAPINE 5 MG PO TBDP
5.0000 mg | ORAL_TABLET | Freq: Every day | ORAL | Status: DC
  Administered 2024-05-17 – 2024-05-22 (×6): 5 mg via ORAL
  Filled 2024-05-16 (×6): qty 1

## 2024-05-16 MED ORDER — BISACODYL 10 MG RE SUPP
10.0000 mg | Freq: Once | RECTAL | Status: AC
  Administered 2024-05-16: 10 mg via RECTAL
  Filled 2024-05-16: qty 1

## 2024-05-16 NOTE — Progress Notes (Signed)
 TOC Dementia Note   Patient Details  Name: Nancy Bell Date of Birth: 02-16-1925 05/16/2024, 1:14 PM   To Whom It May Concern:  Please be advised that the above-named patient has a primary diagnosis of dementia which supersedes any psychiatric diagnosis.   Transition of Care [facility]) CM/SW Contact: Tawni [name], LCSW Phone Number: 05/16/2024, 1:14 PM

## 2024-05-16 NOTE — Evaluation (Signed)
 Occupational Therapy Evaluation Patient Details Name: Nancy Bell MRN: [id number] DOB: June 15, 1924 Today's Date: 05/16/2024   History of Present Illness   89 year old woman, from [facility]; she apparently experienced a fall on 05/12/24 and began to complain of back pain on 1/3 as well as left shoulder pain and tenderness. Chest x-ray in the ED revealed a large left pneumothorax, chest tube placed. She was also found to have a minimally displaced left distal clavicle fracture- sling ordered. Prior medical history of R breast cancer, dementia, HTN, anxiety     Clinical Impressions The pt is currently presenting with the below listed deficits (see OT problem list). As such, her ADL performance is compromised and she requires assistance for self-care management. At her baseline, the pt required significant assistance for bathing, dressing and toileting. She wore briefs, however was at least occasionally continent of bowel. She could perform self-feeding with set-up/supervision to min assist when eating finger food. During the session today, she required max assist for supine to sit. Once seated EOB, she required CGA to min assist for sitting balance, and moderate to maximal hand-over-hand assist for face washing and hair combing. She also required multi-focal cues for initiation, sequencing and attention to tasks.  She will benefit from further OT services to maximize her ADL performance & to decrease the risk for further weakness.      If plan is discharge home, recommend the following:   A lot of help with walking and/or transfers;A lot of help with bathing/dressing/bathroom;Assistance with feeding;Direct supervision/assist for financial management;Supervision due to cognitive status;Direct supervision/assist for medications management     Functional Status Assessment   Patient has had a recent decline in their functional status and demonstrates the ability to make significant  improvements in function in a reasonable and predictable amount of time.     Equipment Recommendations   Other (comment) (defer to next level of care)     Recommendations for Other Services         Precautions/Restrictions   Precautions Precautions: Fall Required Braces or Orthoses: Sling (LUE)     Mobility Bed Mobility Overal bed mobility: Needs Assistance Bed Mobility: Sit to Supine, Supine to Sit     Supine to sit: Max assist Sit to supine: Max assist   General bed mobility comments: She required multi-modal cues for sequencing, including advancing BLE off the bed and for trunk management        Balance       Sitting balance - Comments: CGA to min assist EOB         ADL either performed or assessed with clinical judgement   ADL Overall ADL's : Needs assistance/impaired Eating/Feeding: Moderate assistance;Bed level   Grooming: Maximal assistance;Cueing for sequencing;Sitting Grooming Details (indicate cue type and reason): Pt required max hand-over-hand assist to perform face washing and hair combing seated EOB. She also required multi-focal cues for initiation, sequencing and attention to tasks. She also CGA to min assist to maintain sitting balance EOB. Upper Body Bathing: Maximal assistance;Bed level;Cueing for sequencing   Lower Body Bathing: Total assistance;Bed level   Upper Body Dressing : Maximal assistance;Bed level;Cueing for sequencing   Lower Body Dressing: Total assistance;Bed level                        Pertinent Vitals/Pain Pain Assessment Pain Assessment: No/denies pain     Extremity/Trunk Assessment Upper Extremity Assessment Upper Extremity Assessment: Right hand dominant;RUE deficits/detail RUE Deficits / Details:  AROM WFL. Able to squeeze this OT's hand to command. LUE Deficits / Details: Elbow and shoulder ROM and strength not assessed, due to clavicle fracture. Pt was able to wiggle her fingers and demo gross  grasp.   Lower Extremity Assessment Lower Extremity Assessment: Generalized weakness;Difficult to assess due to impaired cognition       Communication Communication Communication: Impaired Factors Affecting Communication: Difficulty expressing self   Cognition Arousal: Alert Behavior During Therapy: WFL for tasks assessed/performed Cognition: History of cognitive impairments, Cognition impaired   Orientation impairments: Place, Time, Situation Awareness: Online awareness impaired Memory impairment (select all impairments): Short-term memory, Working civil service fast streamer, Conservation officer, historic buildings Attention impairment (select first level of impairment): Divided attention, Alternating attention, Sustained attention Executive functioning impairment (select all impairments): Organization, Sequencing, Problem solving, Initiation OT - Cognition Comments: Oriented to person only.                 Following commands: Impaired       Cueing  General Comments   Cueing Techniques: Verbal cues;Gestural cues;Tactile cues              Home Living       Type of Home: Other(Comment) (memory care facility)        Additional Comments: Pt's daughter's were present and provided information regarding the pt's prior level of functioning and living situation. The pt resides at [place].      Prior Functioning/Environment Prior Level of Function : Needs assist             Mobility Comments: She required at least mod assist for squat-pivot transfers into and out of her wheelchair and she propelled the wheelchair with her feet. She has not ambulated in approximately one year. ADLs Comments: The pt required significant assistance for bathing, dressing and toileting. She wore briefs, however was at least occasionally continent of bowel. She could perform self-feeding with set-up/supervision to min assist when eating finger food.    OT Problem List: Decreased strength;Decreased  range of motion;Impaired balance (sitting and/or standing);Decreased coordination;Decreased cognition;Decreased knowledge of use of DME or AE;Decreased knowledge of precautions   OT Treatment/Interventions: Self-care/ADL training;Therapeutic exercise;Energy conservation;DME and/or AE instruction;Cognitive remediation/compensation;Balance training;Patient/family education;Visual/perceptual remediation/compensation      OT Goals(Current goals can be found in the care plan section)   Acute Rehab OT Goals OT Goal Formulation: Patient unable to participate in goal setting Time For Goal Achievement: 05/30/24 Potential to Achieve Goals: Fair ADL Goals Pt Will Perform Eating: with set-up;with supervision;sitting Pt Will Perform Grooming: with min assist;sitting Pt Will Perform Upper Body Bathing: with min assist;sitting Pt Will Perform Upper Body Dressing: with min assist;sitting Additional ADL Goal #1: The pt will perform bed mobility with min assist, in prep for progressive ADL participation.   OT Frequency:  Min 2X/week       AM-PAC OT 6 Clicks Daily Activity     Outcome Measure Help from another person eating meals?: A Lot Help from another person taking care of personal grooming?: A Lot Help from another person toileting, which includes using toliet, bedpan, or urinal?: Total Help from another person bathing (including washing, rinsing, drying)?: A Lot Help from another person to put on and taking off regular upper body clothing?: A Lot Help from another person to put on and taking off regular lower body clothing?: Total 6 Click Score: 10   End of Session Equipment Utilized During Treatment: Other (comment) (N/A) Nurse Communication: Mobility status  Activity Tolerance: Patient tolerated treatment  well Patient left: in bed;with call bell/phone within reach;with bed alarm set;with family/visitor present  OT Visit Diagnosis: Other abnormalities of gait and mobility  (R26.89);History of falling (Z91.81);Muscle weakness (generalized) (M62.81);Feeding difficulties (R63.3);Other symptoms and signs involving cognitive function;Cognitive communication deficit (R41.841)                Time: 1336-1400 OT Time Calculation (min): 24 min Charges:  OT General Charges $OT Visit: 1 Visit OT Evaluation $OT Eval Moderate Complexity: 1 Mod OT Treatments $Self Care/Home Management : 8-22 mins   Delanna JINNY Lesches, OTR/L 05/16/2024, 2:38 PM

## 2024-05-16 NOTE — Progress Notes (Signed)
 Notified Dr. Sebastian that patient still agitated after Zyprexa  and Ativan .

## 2024-05-16 NOTE — Progress Notes (Signed)
 " PROGRESS NOTE    Nancy Bell  FMW:[id number] DOB: 1924/10/18 DOA: 05/13/2024 PCP: Pia Kerney SQUIBB, MD    Chief Complaint  Patient presents with   Fall    Brief Narrative:  [name] is a 89 y.o. female with medical history significant for advanced dementia, hypertension and anxiety being admitted to the hospital with traumatic left-sided pneumothorax.    Assessment & Plan:   Principal Problem:   Pneumothorax on left Active Problems:   Anxiety disorder   Essential hypertension   Dementia with behavioral disturbance (HCC)   Sundowning   Gastroesophageal reflux disease   Fall  #1 traumatic left-sided pneumothorax -Secondary to fall, noted after patient found on ground at facility. - Chest tube placed. - Patient on IV fentanyl  as needed. - Patient seen in consultation by pulmonary who are managing chest tube. - Continue scheduled IV Toradol  15 mg every 8 hours x 3 days. - IV Robaxin  as needed. - Oxycodone  as needed. - Senokot-S twice daily. -Per pulmonary no air leak on waterseal, chest tube discontinued on 05/15/2024 per pulmonary. - Continue current pain management. - Per pulmonary/PCCM.  2.  Agitation/??  Sundowning -It is noted that patient has agitation at facility, admitting physician states family was concerned about possible UTI. - Patient with a leukocytosis likely reactive secondary to problem #1. -Leukocytosis trending down. -Urinalysis nitrite negative, leukocytes negative. - Increase Zyprexa  to 5 mg daily at 5/6pm.  3.  Unwitnessed fall -CT head CT C-spine negative for any acute abnormalities. - PT/OT.  4.  GERD - PPI.   5.  Dementia -Continue Cymbalta , Remeron , Ativan  as needed for anxiety. - Due to agitation/concern for sundowning patient started on Zyprexa  2.5 mg daily in the evenings.  - Increase Zyprexa  to 5 mg daily.  6.  Hypertension - Continue home regimen propranolol .    7.  Minimally displaced distal left clavicular  fracture -Likely secondary to fall. - Left upper extremity sling. - Outpatient follow-up.  8.  Hypokalemia - Repleted.  9.  Abdominal discomfort/??  Constipation -Dulcolax suppository x 1 ordered. - Sorbitol  p.o. x 1. - Place on MiraLAX daily. - Continue Senokot-S twice daily. - Check abdominal films.   DVT prophylaxis: Lovenox  Code Status: DNR limited Family Communication: Updated daughters at bedside. Disposition: Likely back to memory care unit once cleared by pulmonary.  Daughters do not want patient to go back to memory care unit she came from.  TOC consulted  Status is: Inpatient The patient will require care spanning > 2 midnights and should be moved to inpatient because: Severity of illness.   Consultants:  PCCM/pulmonary: Dr. Shelah 05/13/2024  Procedures:  CT head CT C-spine 05/13/2024 Chest x-ray 05/13/2024, 05/14/2024   Antimicrobials:  Anti-infectives (From admission, onward)    None         Subjective: Patient alert, laying in bed.  Denies any significant shortness of breath.  Some complaints of left-sided chest pain.  Some abdominal discomfort.  Cannot remember the last time she had bowel movement.  Per daughters who stated that patient ate well this morning.  Daughters feel Zyprexa  has helped significantly with her sundowning.   Objective: Vitals:   05/15/24 0800 05/15/24 2130 05/16/24 0450 05/16/24 1154  BP: (!) 117/59 (!) 115/53 (!) 112/52   Pulse: (!) 47 64 62 (!) 59  Resp: 16 16 18    Temp:  98.5 F (36.9 C) 97.8 F (36.6 C)   TempSrc:  Oral    SpO2: 98% 96% 97%  Weight:      Height:        Intake/Output Summary (Last 24 hours) at 05/16/2024 1321 Last data filed at 05/16/2024 1130 Gross per 24 hour  Intake 1130 ml  Output --  Net 1130 ml    Filed Weights   05/13/24 1223  Weight: 53.4 kg    Examination:  General exam: NAD.  Left upper extremity in sling. Respiratory system: CTAB anterior lung fields.  No wheezes, no crackles, no  rhonchi.  Fair air movement.  Cardiovascular system: RRR no murmurs rubs or gallops.  No JVD.  No pitting lower extremity edema.   Gastrointestinal system: Abdomen is soft, nondistended, some diffuse discomfort/tenderness to palpation.  Positive bowel sounds.  No rebound.  No guarding.  Central nervous system: Awake, alert.  Moving extremities spontaneously.  No focal neurological deficits.  Extremities: Symmetric 5 x 5 power. Skin: No rashes, lesions or ulcers Psychiatry: Judgement and insight fair to normal.  Mood and affect appropriate.     Data Reviewed: I have personally reviewed following labs and imaging studies  CBC: Recent Labs  Lab 05/13/24 1522 05/14/24 0417 05/15/24 0346 05/16/24 0428  WBC 14.5* 11.8* 7.3 9.1  NEUTROABS 12.1*  --  4.7  --   HGB 15.0 13.3 11.3* 12.4  HCT 46.4* 41.0 35.4* 36.6  MCV 88.7 88.7 90.1 87.6  PLT 255 204 156 146*    Basic Metabolic Panel: Recent Labs  Lab 05/13/24 1522 05/14/24 0417 05/15/24 0346 05/16/24 0428  NA 143 142 143 141  K 4.1 3.9 3.4* 4.1  CL 108 109 112* 110  CO2 25 23 23  19*  GLUCOSE 127* 118* 76 91  BUN 14 15 19 17   CREATININE 0.83 0.78 0.70 0.68  CALCIUM 9.3 8.7* 8.1* 8.3*    GFR: Estimated Creatinine Clearance: 33.3 mL/min (by C-G formula based on SCr of 0.68 mg/dL).  Liver Function Tests: No results for input(s): AST, ALT, ALKPHOS, BILITOT, PROT, ALBUMIN in the last 168 hours.  CBG: No results for input(s): GLUCAP in the last 168 hours.   Recent Results (from the past 240 hours)  Urine Culture (for pregnant, neutropenic or urologic patients or patients with an indwelling urinary catheter)     Status: None   Collection Time: 05/14/24  1:55 PM   Specimen: Urine, Catheterized  Result Value Ref Range Status   Specimen Description   Final    URINE, CATHETERIZED Performed at [facility], [address] [place], [place] [address]    Special Requests   Final     NONE Performed at [facility], [address], [place], [place] [address]    Culture   Final    NO GROWTH Performed at [facility], [address], [place], [place] [address]    Report Status 05/16/2024 FINAL  Final         Radiology Studies: DG Chest Port 1 View Result Date: 05/15/2024 CLINICAL DATA:  Follow-up left pneumothorax EXAM: PORTABLE CHEST 1 VIEW COMPARISON:  Earlier today. FINDINGS: Left pleural catheter remains in place with an interval small amount of adjacent linear density and small localized left lateral pneumothorax at the location of catheter entry. Mildly enlarged cardiac silhouette. Tortuous and partially calcified thoracic aorta. Mild decrease in left basilar atelectasis with persistent minimal bilateral pleural fluid. Thoracic spine degenerative changes. IMPRESSION: 1. Small left lateral pneumothorax at the location of catheter entry. 2. Mild decrease in left basilar atelectasis. 3. Persistent minimal bilateral pleural fluid.  Electronically Signed   By: Elspeth Bathe M.D.   On: 05/15/2024 17:43   DG Chest Port 1 View Result Date: 05/15/2024 CLINICAL DATA:  Follow-up left pneumothorax. EXAM: PORTABLE CHEST 1 VIEW COMPARISON:  05/14/2024 FINDINGS: A left pleural catheter remains in place with no pneumothorax seen. Stable dense left lower lobe consolidation with little change in adjacent atelectasis. Minimal bilateral pleural effusions. Normal-sized heart. Tortuous and partially calcified thoracic aorta. Thoracic spine degenerative changes. IMPRESSION: 1. No pneumothorax. 2. Stable dense left lower lobe atelectasis or pneumonia with little change in adjacent atelectasis. 3. Minimal bilateral pleural effusions. Electronically Signed   By: Elspeth Bathe M.D.   On: 05/15/2024 17:40   DG CHEST PORT 1 VIEW Result Date: 05/14/2024 EXAM: 1 VIEW(S) XRAY OF THE CHEST 05/14/2024 07:42:00 PM COMPARISON: 05/14/2024 CLINICAL HISTORY: [id number] Pneumothorax on left  [id number] Pneumothorax on left FINDINGS: LINES, TUBES AND DEVICES: Left pigtail chest tube remains in place, unchanged. LUNGS AND PLEURA: Left base atelectasis. Small left pleural effusion. No definite residual pneumothorax. HEART AND MEDIASTINUM: Aortic atherosclerosis. No acute abnormality of the cardiac and mediastinal silhouettes. BONES AND SOFT TISSUES: No acute osseous abnormality. IMPRESSION: 1. Left pigtail chest tube remains in place, unchanged. No definite residual pneumothorax. 2. Left base atelectasis and small left pleural effusion. Electronically signed by: Kevin Dover MD 05/14/2024 07:57 PM EST RP Workstation: [id number]        Scheduled Meds:  DULoxetine   30 mg Oral Daily   enoxaparin  (LOVENOX ) injection  40 mg Subcutaneous Q24H   ketorolac   15 mg Intravenous Q8H   mirtazapine   15 mg Oral QHS   OLANZapine  zydis  2.5 mg Oral Daily   pantoprazole   40 mg Oral Daily   propranolol   40 mg Oral BID   senna-docusate  1 tablet Oral BID   tamsulosin   0.4 mg Oral QPC supper   Continuous Infusions:     LOS: 2 days    Time spent: 35 minutes    Toribio Hummer, MD [facility]   To contact the attending provider between 7A-7P or the covering provider during after hours 7P-7A, please log into the web site [email or web address] and access using universal [place] password for that web site. If you do not have the password, please call the hospital operator.  05/16/2024, 1:21 PM    "

## 2024-05-16 NOTE — Progress Notes (Signed)
 Notified Dr. Sebastian patient continues to be agitate despite second dose of Zyprexa .

## 2024-05-16 NOTE — NC FL2 (Signed)
 " [place]  MEDICAID FL2 LEVEL OF CARE FORM     IDENTIFICATION  Patient Name: Nancy Bell Birthdate: [birth year] Sex: female Admission Date (Current Location): 05/13/2024  [facility] and [place] Number:  Producer, Television/film/video and Address:  [facility],  [address], [place] [address]      Provider Number: [id number]  Attending Physician Name and Address:  Sebastian Toribio GAILS, MD  Relative Name and Phone Number:  Forest-Rogers,Cheryl  Daughter, Emergency Contact  [phone] [facility])    Current Level of Care: Hospital Recommended Level of Care: Skilled Nursing Facility Prior Approval Number:    Date Approved/Denied:   PASRR Number: Pending  Discharge Plan: SNF    Current Diagnoses: Patient Active Problem List   Diagnosis Date Noted   Dementia with behavioral disturbance (HCC) 05/14/2024   Sundowning 05/14/2024   Gastroesophageal reflux disease 05/14/2024   Fall 05/14/2024   Pneumothorax on left 05/13/2024   Protein-calorie malnutrition, severe 04/17/2023   Weakness 04/12/2023   Intractable nausea and vomiting 10/01/2022   Hypokalemia 10/01/2022   Age-related osteoporosis without current pathological fracture 09/30/2021   Anxiety disorder 09/30/2021   Dysphagia 09/30/2021   Essential hypertension 09/30/2021   Pure hypercholesterolemia 09/30/2021   Hyperglycemia 09/30/2021   Atherosclerosis of abdominal aorta 09/30/2021   Cancer of left female breast (HCC) 09/30/2020   Hx breast cancer, IDC right LIQ,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    Orientation RESPIRATION BLADDER Height & Weight     Self, Time, Place, Situation  O2 (4L) Continent Weight: 117 lb 11.6 oz (53.4 kg) Height:  5' 3 (160 cm)  BEHAVIORAL SYMPTOMS/MOOD NEUROLOGICAL BOWEL NUTRITION STATUS      Continent Diet (regular)  AMBULATORY STATUS COMMUNICATION OF NEEDS Skin   Extensive Assist Verbally Other (Comment) (see d/c suammary)                       Personal Care  Assistance Level of Assistance  Bathing, Feeding, Dressing Bathing Assistance: Limited assistance Feeding assistance: Limited assistance Dressing Assistance: Limited assistance     Functional Limitations Info  Sight, Hearing, Speech Sight Info: Adequate Hearing Info: Adequate Speech Info: Adequate    SPECIAL CARE FACTORS FREQUENCY  PT (By licensed PT), OT (By licensed OT)     PT Frequency: 5 x a week OT Frequency: 5 x a week            Contractures Contractures Info: Not present    Additional Factors Info  Code Status, Allergies, Psychotropic Code Status Info: DNR Allergies Info: Augmentin (Amoxicillin-pot Clavulanate)  Vicodin (Hydrocodone-acetaminophen )  Hydrocodone  Oxycontin  (Oxycodone  Hcl)  Tylenol  (Acetaminophen )  Ultram  (Tramadol  Hcl) Psychotropic Info: OLANZapine  zydis (ZYPREXA ) disintegrating tablet 2.5 mg,         Current Medications (05/16/2024):  This is the current hospital active medication list Current Facility-Administered Medications  Medication Dose Route Frequency Provider Last Rate Last Admin   albuterol  (PROVENTIL ) (2.5 MG/3ML) 0.083% nebulizer solution 2.5 mg  2.5 mg Nebulization Q2H PRN Zella, Mir M, MD       DULoxetine  (CYMBALTA ) DR capsule 30 mg  30 mg Oral Daily Ikramullah, Mir M, MD   30 mg at 05/15/24 1022   enoxaparin  (LOVENOX ) injection 40 mg  40 mg Subcutaneous Q24H Zella, Mir M, MD   40 mg at 05/15/24 2207   fentaNYL  (SUBLIMAZE ) injection 25 mcg  25 mcg Intravenous Q2H PRN Thompson, Daniel V, MD   25 mcg at 05/14/24 1754   ketorolac  (  TORADOL ) 30 MG/ML injection 15 mg  15 mg Intravenous Q8H Sebastian Toribio GAILS, MD   15 mg at 05/16/24 0518   LORazepam  (ATIVAN ) tablet 0.5 mg  0.5 mg Oral BID PRN Zella, Mir M, MD   0.5 mg at 05/15/24 1755   methocarbamol  (ROBAXIN ) injection 500 mg  500 mg Intravenous Q8H PRN Sebastian Toribio GAILS, MD   500 mg at 05/14/24 1235   mirtazapine  (REMERON ) tablet 15 mg  15 mg Oral QHS Zella, Mir M, MD    15 mg at 05/15/24 2208   OLANZapine  zydis (ZYPREXA ) disintegrating tablet 2.5 mg  2.5 mg Oral Daily Sebastian Toribio GAILS, MD   2.5 mg at 05/15/24 1022   ondansetron  (ZOFRAN ) tablet 4 mg  4 mg Oral Q6H PRN Zella, Mir M, MD       Or   ondansetron  (ZOFRAN ) injection 4 mg  4 mg Intravenous Q6H PRN Zella Katha HERO, MD       Oral care mouth rinse  15 mL Mouth Rinse PRN Sebastian Toribio GAILS, MD       oxyCODONE  (ROXICODONE ) 5 MG/5ML solution 5 mg  5 mg Oral Q6H PRN Sebastian Toribio GAILS, MD   5 mg at 05/16/24 0145   pantoprazole  (PROTONIX ) EC tablet 40 mg  40 mg Oral Daily Ikramullah, Mir M, MD   40 mg at 05/15/24 1022   propranolol  (INDERAL ) tablet 40 mg  40 mg Oral BID Zella Katha HERO, MD   40 mg at 05/15/24 2208   senna-docusate (Senokot-S) tablet 1 tablet  1 tablet Oral BID Sebastian Toribio GAILS, MD   1 tablet at 05/15/24 2208   tamsulosin  (FLOMAX ) capsule 0.4 mg  0.4 mg Oral QPC supper Zella, Mir M, MD   0.4 mg at 05/15/24 1755   traZODone  (DESYREL ) tablet 50 mg  50 mg Oral QHS PRN Zella Katha HERO, MD         Discharge Medications: Please see discharge summary for a list of discharge medications.  Relevant Imaging Results:  Relevant Lab Results:   Additional Information SSN:[id number]  Tawni HERO Kimela Malstrom, LCSW     "

## 2024-05-16 NOTE — Evaluation (Signed)
 Speech Language Pathology Evaluation Patient Details Name: Nancy Bell MRN: [id number] DOB: [birth year] Today's Date: 05/16/2024 Time: 9045-8977 SLP Time Calculation (min) (ACUTE ONLY): 28 min  Problem List:  Patient Active Problem List   Diagnosis Date Noted   Dementia with behavioral disturbance (HCC) 05/14/2024   Sundowning 05/14/2024   Gastroesophageal reflux disease 05/14/2024   Fall 05/14/2024   Pneumothorax on left 05/13/2024   Protein-calorie malnutrition, severe 04/17/2023   Weakness 04/12/2023   Intractable nausea and vomiting 10/01/2022   Hypokalemia 10/01/2022   Age-related osteoporosis without current pathological fracture 09/30/2021   Anxiety disorder 09/30/2021   Dysphagia 09/30/2021   Essential hypertension 09/30/2021   Pure hypercholesterolemia 09/30/2021   Hyperglycemia 09/30/2021   Atherosclerosis of abdominal aorta 09/30/2021   Cancer of left female breast (HCC) 09/30/2020   Hx breast cancer, IDC right LIQ,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   Past Medical History:  Past Medical History:  Diagnosis Date   Anxiety    Cancer (HCC)    right breast   History of hiatal hernia    Hyperlipidemia    Hypertension    Past Surgical History:  Past Surgical History:  Procedure Laterality Date   BREAST BIOPSY  2 weeks ago   BREAST LUMPECTOMY  01/29/2010   Right- Dr Merrilyn   TOOTH EXTRACTION     TOTAL MASTECTOMY Left 09/30/2020   Procedure: LEFT TOTAL MASTECTOMY;  Surgeon: Curvin Deward MOULD, MD;  Location: [facility] OR;  Service: General;  Laterality: Left;   HPI:  89 year old woman, from [facility],  apparently experienced a fall on 1/2. Began to complain of back pain on 1/3 as well as left shoulder pain and tenderness. Chest x-ray in the ED revealed a large left pneumothorax, Chest tube placed. Minimally displaced Left distal clavicle fracture- sling ordered.DC'd 05/15/24 Prior medical history of breast cancer, dementia and memory loss residing in a memory care assisted  living.   Assessment / Plan / Recommendation Clinical Impression  Pt has a h/o advanced dementia and is currently presenting at her baseline level of function as confirmed by her two daughters. They have observed a decline in function over time, but deny any acute changes to mentation with this admission. They acknowledge that she typically requires 24/7 care and assistance from a cognitive standpoint with ADLs/iADLs. Would recommend that she receive frequent care and supervision upon discharge. SLP to sign off acutely.     SLP Assessment  SLP Recommendation/Assessment: Patient does not need any further Speech Language Pathology Services SLP Visit Diagnosis: Cognitive communication deficit (R41.841)     Assistance Recommended at Discharge  Frequent or constant Supervision/Assistance  Functional Status Assessment Patient has not had a recent decline in their functional status  Frequency and Duration           SLP Evaluation Cognition  Overall Cognitive Status: History of cognitive impairments - at baseline       Comprehension  Auditory Comprehension Overall Auditory Comprehension: Impaired at baseline    Expression Expression Primary Mode of Expression: Verbal Verbal Expression Overall Verbal Expression: Impaired at baseline   Oral / Motor  Motor Speech Overall Motor Speech: Impaired at baseline            Leita SAILOR., M.A. CCC-SLP Acute Rehabilitation Services Office: [phone]  Secure chat preferred  05/16/2024, 11:45 AM

## 2024-05-16 NOTE — TOC Progression Note (Incomplete)
 Transition of Care [facility]) - Progression Note    Patient Details  Name: Nancy Bell MRN: [id number] Date of Birth: 12/24/1924  [facility]) CM/SW Contact  Tawni [name], LCSW Phone Number: 05/16/2024, 11:51 AM  Clinical Narrative:       Expected Discharge Plan:  (TBD) Barriers to Discharge: Continued Medical Work up               Expected Discharge Plan and Services       Living arrangements for the past 2 months: Assisted Living Facility                                       Social Drivers of Health (SDOH) Interventions SDOH Screenings   Food Insecurity: Low Risk (09/01/2023)   Received from [facility]  Housing: Low Risk (09/01/2023)   Received from [facility]  Transportation Needs: No Transportation Needs (09/01/2023)   Received from [facility]  Utilities: Low Risk (09/01/2023)   Received from [facility]  Tobacco Use: Low Risk (09/21/2023)   Received from [facility]    Readmission Risk Interventions     No data to display

## 2024-05-17 ENCOUNTER — Encounter (HOSPITAL_COMMUNITY): Payer: Self-pay | Admitting: Internal Medicine

## 2024-05-17 DIAGNOSIS — J939 Pneumothorax, unspecified: Secondary | ICD-10-CM | POA: Diagnosis not present

## 2024-05-17 LAB — CBC
HCT: 33.7 % — ABNORMAL LOW (ref 36.0–46.0)
Hemoglobin: 11.3 g/dL — ABNORMAL LOW (ref 12.0–15.0)
MCH: 29.2 pg (ref 26.0–34.0)
MCHC: 33.5 g/dL (ref 30.0–36.0)
MCV: 87.1 fL (ref 80.0–100.0)
Platelets: 187 K/uL (ref 150–400)
RBC: 3.87 MIL/uL (ref 3.87–5.11)
RDW: 13.2 % (ref 11.5–15.5)
WBC: 11.6 K/uL — ABNORMAL HIGH (ref 4.0–10.5)
nRBC: 0 % (ref 0.0–0.2)

## 2024-05-17 LAB — MAGNESIUM: Magnesium: 1.8 mg/dL (ref 1.7–2.4)

## 2024-05-17 LAB — BASIC METABOLIC PANEL WITH GFR
Anion gap: 9 (ref 5–15)
BUN: 16 mg/dL (ref 8–23)
CO2: 22 mmol/L (ref 22–32)
Calcium: 8.1 mg/dL — ABNORMAL LOW (ref 8.9–10.3)
Chloride: 109 mmol/L (ref 98–111)
Creatinine, Ser: 0.64 mg/dL (ref 0.44–1.00)
GFR, Estimated: >60 mL/min
Glucose, Bld: 114 mg/dL — ABNORMAL HIGH (ref 70–99)
Potassium: 3.7 mmol/L (ref 3.5–5.1)
Sodium: 140 mmol/L (ref 135–145)

## 2024-05-17 MED ORDER — DULOXETINE HCL 60 MG PO CPEP
60.0000 mg | ORAL_CAPSULE | Freq: Every day | ORAL | Status: DC
  Administered 2024-05-17 – 2024-05-22 (×6): 60 mg via ORAL
  Filled 2024-05-17 (×5): qty 1

## 2024-05-17 MED ORDER — TRAZODONE HCL 100 MG PO TABS
100.0000 mg | ORAL_TABLET | Freq: Every evening | ORAL | Status: DC | PRN
  Administered 2024-05-18 – 2024-05-19 (×2): 100 mg via ORAL
  Filled 2024-05-17 (×2): qty 1

## 2024-05-17 MED ORDER — SODIUM CHLORIDE 0.9 % IV SOLN: INTRAVENOUS | Status: AC

## 2024-05-17 NOTE — Progress Notes (Addendum)
 Physical Therapy Treatment Patient Details Name: Nancy Bell MRN: [id number] DOB: 25-Sep-1924 Today's Date: 05/17/2024   History of Present Illness 89 year old woman, from [facility],  apparently experienced a fall on 1/2. Began to complain of back pain on 1/3 as well as left shoulder pain and tenderness. Chest x-ray in the ED revealed a large left pneumothorax, Chest tube placed. Minimally displaced Left distal clavicle fracture- sling ordered.DC'd 05/15/24 Prior medical history of breast cancer, dementia and memory loss residing in a memory care assisted living.    PT Comments  Pt and family agreeable to therapy session. Pt continues to require Total A for mobility. She was able to sit EOB today  with Min-Mod A for static sitting balance. She was cooperative. She was lethargic during session. Family reports they are undecided about d/c plans. Could attempt a trial of SNF rehab to see if patient is able to make any gains before returning to a memory care facility-if able to find one that can manage her care needs. Will continue to follow.     If plan is discharge home, recommend the following: Two people to help with walking and/or transfers;Two people to help with bathing/dressing/bathroom;Assistance with feeding   Can travel by private vehicle        Equipment Recommendations  None recommended by PT    Recommendations for Other Services       Precautions / Restrictions Precautions Precautions: Fall Precaution/Restrictions Comments: LUE sling for  clavicle fx Required Braces or Orthoses: Sling Restrictions Weight Bearing Restrictions Per Provider Order: No     Mobility  Bed Mobility Overal bed mobility: Needs Assistance Bed Mobility: Rolling, Supine to Sit, Sit to Supine Rolling: Total assist, +2 for physical assistance, +2 for safety/equipment Sidelying to sit: Total assist, +2 for safety/equipment, HOB elevated     Sit to sidelying: Total assist, +2 for safety/equipment,  +2 for physical assistance General bed mobility comments: She required multi-modal cues for sequencing, including advancing BLE off the bed and for trunk management. Utilized bedpad to aid with scooting, EOB postioning. Once sitting, Min-Mod A for static sitting balance. Leaning and LOB to R side without external support    Transfers                        Ambulation/Gait                   Stairs             Wheelchair Mobility     Tilt Bed    Modified Rankin (Stroke Patients Only)       Balance Overall balance assessment: Needs assistance, History of Falls Sitting-balance support: Single extremity supported, Feet unsupported Sitting balance-Leahy Scale: Poor Sitting balance - Comments: Min-Mod A for static sitting balance. Postural control: Right lateral lean                                  Communication Communication Communication: Impaired Factors Affecting Communication: Difficulty expressing self  Cognition Arousal: Alert Behavior During Therapy: WFL for tasks assessed/performed   PT - Cognitive impairments: History of cognitive impairments                       PT - Cognition Comments: oriented  to self and dtrs Following commands: Impaired Following commands impaired: Follows one step commands inconsistently    Cueing Cueing Techniques:  Verbal cues, Gestural cues, Tactile cues  Exercises Total Joint Exercises Heel Slides:  (attempted but pt resistive after 1st rep)    General Comments        Pertinent Vitals/Pain Pain Assessment Pain Assessment: Faces Faces Pain Scale: Hurts little more Pain Location: with bed mobility, especially turning Pain Descriptors / Indicators: Grimacing, Moaning Pain Intervention(s): Limited activity within patient's tolerance, Monitored during session, Repositioned    Home Living                          Prior Function            PT Goals (current goals can  now be found in the care plan section) Progress towards PT goals: Progressing toward goals    Frequency    Min 2X/week      PT Plan      Co-evaluation              AM-PAC PT 6 Clicks Mobility   Outcome Measure  Help needed turning from your back to your side while in a flat bed without using bedrails?: Total Help needed moving from lying on your back to sitting on the side of a flat bed without using bedrails?: Total Help needed moving to and from a bed to a chair (including a wheelchair)?: Total Help needed standing up from a chair using your arms (e.g., wheelchair or bedside chair)?: Total Help needed to walk in hospital room?: Total Help needed climbing 3-5 steps with a railing? : Total 6 Click Score: 6    End of Session   Activity Tolerance: Patient tolerated treatment well Patient left: in bed;with call bell/phone within reach;with bed alarm set;with family/visitor present   PT Visit Diagnosis: Unsteadiness on feet (R26.81);Other abnormalities of gait and mobility (R26.89);Muscle weakness (generalized) (M62.81);History of falling (Z91.81)     Time: 1441-1501 PT Time Calculation (min) (ACUTE ONLY): 20 min  Charges:    $Therapeutic Activity: 8-22 mins PT General Charges $$ ACUTE PT VISIT: 1 Visit                         Dannial SQUIBB, PT Acute Rehabilitation  Office: [phone]

## 2024-05-17 NOTE — TOC Progression Note (Addendum)
 Transition of Care [facility]) - Progression Note    Patient Details  Name: ADALEAH FORGET MRN: [id number] Date of Birth: 20-Mar-1925  [facility]) CM/SW Contact  Tawni [name], LCSW Phone Number: 05/17/2024, 1:44 PM  Clinical Narrative:     Attempted to follow up with pt's daughter no answer left VM requesting a return call. ICM to follow.  ADDEN  3:00pm  CSW received a voicemail from the pts daughter. CSW discussed recommendations for SNF placement and reviewed the pts bed offers. Pts daughter stated her concerns regarding the bed offers and requested that CSW reach out to the facilities that are pending to inquire about bed availability. ICM to follow.              Expected Discharge Plan and Services       Living arrangements for the past 2 months: Assisted Living Facility                                       Social Drivers of Health (SDOH) Interventions SDOH Screenings   Food Insecurity: Low Risk (09/01/2023)   Received from [facility]  Housing: Low Risk (09/01/2023)   Received from [facility]  Transportation Needs: No Transportation Needs (09/01/2023)   Received from [facility]  Utilities: Low Risk (09/01/2023)   Received from [facility]  Tobacco Use: Low Risk (09/21/2023)   Received from [facility]    Readmission Risk Interventions     No data to display

## 2024-05-17 NOTE — Progress Notes (Signed)
 " PROGRESS NOTE Nancy Bell  FMW:[id number] DOB: Oct 13, 1924 DOA: 05/13/2024 PCP: Pia Kerney SQUIBB, MD  Brief Narrative/Hospital Course: Nancy Bell is a 89 y.o. female with PMH of advanced dementia, hypertension and anxiety, resident of memory care, who has recently been more combative and difficult to control and was found on the ground after the bed without obvious injury. She was brought to the ED for unclear reason and later complained of left-sided chest shoulder discomfort and workup showed large left pneumothor  s/p chest tube has been placed and hospitalist admission was requested on 05/13/24.  PCCM consulted for chest tube management> subsequently discontinued 1/5 PT OT working closely recommending SNF  Subjective: Seen and examined today Patient is sleeping comfortably, daughter and son-in-law at bedside Patient had been agitated yesterday despite Zyprexa  doses. Overnight on nasal cannula 4 L afebrile, VSS, Labs reviewed stable CBC BMP  Assessment and plan:  Pneumothorax on left, traumatic Recent fall at memory care: workup showed large left pneumothor  s/p chest tube has been placed and hospitalist admission was requested on 05/13/24.PCCM consulted for chest tube management> subsequently discontinued 1/5 .  Continue pain management muscle relaxant I-S as able  Advanced dementia with agitation At fall risk-with recent fall at facility unwitnessed Memory care resident Anxiety disorder on chronic benzo: Patient has been increasingly more agitated recently it seems.UA negative.  No obvious infection.  CT head CT C-spine no acute finding PTA on Cymbalta , Ativan  0.5 twice daily Remeron  15 bedtime, trazodone  100 bedtime, Xanax  as needed.  Continue same,, also placed on Zyprexa  as needed. Cont ptot, delirium precaution fall precaution.  Xanax  on hold.  Increase trazodone  Cymbalta  to home dose.  Continue Zyprexa  daily With a poor oral intake and failure to thrive prognosis appears poor,  will consult palliative care family understands.  Essential hypertension: BP remains stable on propranolol .  GERD: Continue PPI  Mobility: PT Orders: Active PT Follow up Rec: Recommend Snf, Barriers To Snf Placement - Toc To F/U With Patient/Family For D/C Plans1/09/2024 1533   DVT prophylaxis: enoxaparin  (LOVENOX ) injection 40 mg Start: 05/13/24 2200 Code Status:   Code Status: Limited: Do not attempt resuscitation (DNR) -DNR-LIMITED -Do Not Intubate/DNI  Family Communication: plan of care discussed with patient/ family at bedside. Patient status is: Remains hospitalized because of severity of illness Level of care: Progressive   Dispo: The patient is from: memory care            Anticipated disposition: TBD- SNF Objective: Vitals last 24 hrs: Vitals:   05/16/24 1154 05/16/24 1342 05/16/24 2010 05/17/24 0546  BP:  (!) 118/52 131/68 115/86  Pulse: (!) 59 64 64 72  Resp:  16 16 18   Temp:  (!) 97.5 F (36.4 C) 98.6 F (37 C) 99 F (37.2 C)  TempSrc:  Oral Oral   SpO2:  100% 95% 95%  Weight:      Height:        Physical Examination: General exam: Sleeping  HEENT:Oral mucosa moist, Ear/Nose WNL grossly Respiratory system: Bilaterally clear BS,no use of accessory muscle Cardiovascular system: S1 & S2 +, No JVD. Gastrointestinal system: Abdomen soft,NT,ND, BS+ Nervous System: Sleeping  Extremities: In fetal position  Skin: Warm, no rashes MSK: Normal muscle bulk,tone, power   Medications reviewed:  Scheduled Meds:  DULoxetine   60 mg Oral Daily   enoxaparin  (LOVENOX ) injection  40 mg Subcutaneous Q24H   mirtazapine   15 mg Oral QHS   OLANZapine  zydis  5 mg Oral Daily  pantoprazole   40 mg Oral Daily   propranolol   40 mg Oral BID   senna-docusate  1 tablet Oral BID   sorbitol   30 mL Oral Once   tamsulosin   0.4 mg Oral QPC supper   Continuous Infusions:  sodium chloride  75 mL/hr at 05/17/24 0358   Diet: Diet Order             Diet regular Room service  appropriate? Yes; Fluid consistency: Thin  Diet effective now                   Data Reviewed: I have personally reviewed following labs and imaging studies ( see epic result tab) CBC: Recent Labs  Lab 05/13/24 1522 05/14/24 0417 05/15/24 0346 05/16/24 0428 05/17/24 0423  WBC 14.5* 11.8* 7.3 9.1 11.6*  NEUTROABS 12.1*  --  4.7  --   --   HGB 15.0 13.3 11.3* 12.4 11.3*  HCT 46.4* 41.0 35.4* 36.6 33.7*  MCV 88.7 88.7 90.1 87.6 87.1  PLT 255 204 156 146* 187   CMP: Recent Labs  Lab 05/13/24 1522 05/14/24 0417 05/15/24 0346 05/16/24 0428 05/17/24 0423  NA 143 142 143 141 140  K 4.1 3.9 3.4* 4.1 3.7  CL 108 109 112* 110 109  CO2 25 23 23  19* 22  GLUCOSE 127* 118* 76 91 114*  BUN 14 15 19 17 16   CREATININE 0.83 0.78 0.70 0.68 0.64  CALCIUM 9.3 8.7* 8.1* 8.3* 8.1*  MG  --   --   --   --  1.8   GFR: Estimated Creatinine Clearance: 33.3 mL/min (by C-G formula based on SCr of 0.64 mg/dL). No results for input(s): AST, ALT, ALKPHOS, BILITOT, PROT, ALBUMIN in the last 168 hours. No results for input(s): LIPASE, AMYLASE in the last 168 hours. No results for input(s): AMMONIA in the last 168 hours. Coagulation Profile: No results for input(s): INR, PROTIME in the last 168 hours. Unresulted Labs (From admission, onward)    None      Antimicrobials/Microbiology: Anti-infectives (From admission, onward)    None         Component Value Date/Time   SDES  05/14/2024 1355    URINE, CATHETERIZED Performed at [facility], [address] [place], [place] [address]    SPECREQUEST  05/14/2024 1355    NONE Performed at [facility], [address] [place], [place] [address]    CULT  05/14/2024 1355    NO GROWTH Performed at [facility], [address], [place], [place] [address]    REPTSTATUS 05/16/2024 FINAL 05/14/2024 1355  Procedures:  Mennie LAMY, MD [facility] 05/17/2024, 1:36 PM   "

## 2024-05-17 NOTE — Hospital Course (Addendum)
 Nancy Bell is a 89 y.o. female with PMH of advanced dementia, hypertension and anxiety, resident of memory care, who has recently been more combative and difficult to control and was found on the ground after the bed without obvious injury. She was brought to the ED for unclear reason and later complained of left-sided chest shoulder discomfort and workup showed large left pneumothor  s/p chest tube has been placed and hospitalist admission was requested on 05/13/24.  PCCM consulted for chest tube management> subsequently discontinued 1/5 PT OT working closely recommending SNF,difficult to obtain.PMT evaluated and discussed w/ family and decided on hospice at [facility].   Subjective: Seen and examined  Son-in-law at the bedside.  She just ate some food.  Slept well last night, no complaint OVERNIGHT ON PRN fentanyl  Robaxin  and Ativan , continue on scheduled meds  Discharge Diagnoses:   Pneumothorax on left, traumatic Recent fall at memory care: workup showed large left pneumothor  s/p chest tube has been placed and hospitalist admission was requested on 05/13/24.PCCM consulted for chest tube management> subsequently discontinued 1/5 . Doing well respiratory wise. Encourage incentive spirometry.  Continue pain management as per palliative care   Advanced dementia with agitation At fall risk-with recent fall at facility unwitnessed Memory care resident Anxiety disorder on chronic benzo: Patient has been increasingly more agitated recently it seems.UA negative.  No obvious infection.  CT head CT C-spine no acute finding PTA on Cymbalta , Ativan  0.5 twice daily Remeron  15 bedtime, trazodone  100 bedtime,  Continue current medication for agitation along with Zyprexa , dose.Does get agitation ~ 2 PM  Palliative has been consulted plan for Hospice at [facility]  Minimally displaced fracture of the distal left clavicle: Cont brace  Essential hypertension: BP remains stable on  propranolol .  GERD: Continue PPI  Mobility: PT Orders: Active PT Follow up Rec: Recommend Snf, Barriers To Snf Placement - Toc To F/U With Patient/Family For D/C Plans1/11/2024 1532   DVT prophylaxis: enoxaparin  (LOVENOX ) injection 40 mg Start: 05/13/24 2200 Code Status:   Code Status: Do not attempt resuscitation (DNR) - Comfort care Family Communication: plan of care discussed with patient/ family at bedside updated. Patient status is: Remains hospitalized because of severity of illness Level of care: Progressive   Dispo: The patient is from: memory care            Anticipated disposition: Awaiting for bed at [facility]  Objective: Vitals last 24 hrs: Vitals:   05/21/24 1447 05/21/24 2139 05/21/24 2140 05/22/24 0504  BP: 138/68 (!) 144/74 (!) 144/74 (!) 143/63  Pulse: 65 66 66 69  Resp: 16  20 19   Temp: 98.2 F (36.8 C)  (!) 97.5 F (36.4 C) 99.4 F (37.4 C)  TempSrc:   Oral Oral  SpO2: 93%  94% 98%  Weight:      Height:       Physical Examination: General exam: Eyes open, nonconversant not following commands HEENT:Oral mucosa moist, Ear/Nose WNL grossly Respiratory system: Bilaterally diminished  Cardiovascular system: S1 & S2 +, No JVD. Gastrointestinal system: Abdomen soft,NT,ND, BS+ Nervous System: Eyes open, not following commands Extremities: In fetal position  Skin: Warm, no rashes MSK: Normal muscle bulk,tone, power

## 2024-05-17 NOTE — Plan of Care (Signed)

## 2024-05-18 DIAGNOSIS — Z515 Encounter for palliative care: Secondary | ICD-10-CM

## 2024-05-18 DIAGNOSIS — J939 Pneumothorax, unspecified: Secondary | ICD-10-CM | POA: Diagnosis not present

## 2024-05-18 NOTE — Progress Notes (Signed)
 Occupational Therapy Treatment Patient Details Name: Nancy Bell MRN: [id number] DOB: [birth year] Today's Date: 05/18/2024   History of present illness 89 year old female, from Memory Care facility,  who apparently experienced a fall on 05/12/2024. She began to complain of back pain on 1/3, as well as left shoulder pain and tenderness. Chest x-ray in the ED revealed a large left pneumothorax, chest tube placed. She was also found to have a minimally displaced left distal clavicle fracture- sling ordered. Prior medical history of breast cancer, dementia and memory loss residing in a memory care assisted living.   OT comments  The pt was calm, cooperative and agreeable to therapy participation. Her 2 daughters were present during the session. The pt required significant assistance to transfer into sitting EOB. Once seated EOB, she initially required mod assist for sitting balance, however improved to needing CGA to min assist. While seated EOB, she required increased hand-over-hand assist to perform simple upper body grooming and self-feeding tasks. She was returned to supine, for which she needed max assist to achieve. She reported generalized soreness and feelings of fatigue. She presented with good effort, though she was limited by increased lethargy this date. Continue OT plan of care. Patient will benefit from continued inpatient follow up therapy, <3 hours/day       If plan is discharge home, recommend the following:  A lot of help with walking and/or transfers;A lot of help with bathing/dressing/bathroom;Assistance with feeding;Direct supervision/assist for financial management;Supervision due to cognitive status;Direct supervision/assist for medications management   Equipment Recommendations  Other (comment) (defer to next setting)    Recommendations for Other Services      Precautions / Restrictions Precautions Precautions: Fall Precaution/Restrictions Comments: LUE sling for clavicle  fracture Required Braces or Orthoses: Sling       Mobility Bed Mobility Overal bed mobility: Needs Assistance Bed Mobility: Supine to Sit, Sit to Supine     Supine to sit: HOB elevated, Total assist Sit to supine: Max assist        Transfers    Balance       Sitting balance - Comments: min to mod assist seated EOB           ADL either performed or assessed with clinical judgement   ADL Overall ADL's : Needs assistance/impaired Eating/Feeding: Moderate assistance;Sitting Eating/Feeding Details (indicate cue type and reason): Pt required moderate hand over hand assist to bring a cup to her mouth to drink from it seated EOB. Grooming: Maximal assistance;Cueing for sequencing;Sitting;Cueing for compensatory techniques Grooming Details (indicate cue type and reason): Pt was assisted into sitting EOB. She required min to mod assist for sitting balance, then max hand over hand assist to perform face washing and applying lip moisturizer. She also needed intermittent cues to keep her eyes open and for sustained attention, given her moderate lethargy.         Upper Body Dressing : Total assistance;Sitting Upper Body Dressing Details (indicate cue type and reason): Pt required total assist to correctly position sling while she was in bed. Sling was not properly in place,upon this OT entering her room.                         Communication Communication Factors Affecting Communication: Difficulty expressing self   Cognition Arousal: Lethargic Behavior During Therapy: WFL for tasks assessed/performed Cognition: History of cognitive impairments, Cognition impaired   Orientation impairments: Time, Situation   Memory impairment (select all impairments): Short-term memory, Working  memory, Declarative long-term memory Attention impairment (select first level of impairment): Divided attention, Alternating attention Executive functioning impairment (select all  impairments): Problem solving, Organization                   Following commands: Impaired Following commands impaired: Follows one step commands inconsistently, Follows one step commands with increased time      Cueing   Cueing Techniques: Verbal cues, Gestural cues, Tactile cues             Pertinent Vitals/ Pain       Pain Assessment Pain Assessment: Faces Pain Score: 4  Pain Location: with bed mobility, especially turning Pain Descriptors / Indicators: Grimacing, Moaning Pain Intervention(s): Limited activity within patient's tolerance, Monitored during session, Repositioned   Frequency  Min 2X/week        Progress Toward Goals  OT Goals(current goals can now be found in the care plan section)     Acute Rehab OT Goals OT Goal Formulation: With patient/family Time For Goal Achievement: 05/30/24 Potential to Achieve Goals: Fair  Plan         AM-PAC OT 6 Clicks Daily Activity     Outcome Measure   Help from another person eating meals?: A Lot Help from another person taking care of personal grooming?: A Lot Help from another person toileting, which includes using toliet, bedpan, or urinal?: Total Help from another person bathing (including washing, rinsing, drying)?: A Lot Help from another person to put on and taking off regular upper body clothing?: A Lot Help from another person to put on and taking off regular lower body clothing?: Total 6 Click Score: 10    End of Session Equipment Utilized During Treatment: Oxygen  OT Visit Diagnosis: Other abnormalities of gait and mobility (R26.89);History of falling (Z91.81);Muscle weakness (generalized) (M62.81);Feeding difficulties (R63.3);Other symptoms and signs involving cognitive function;Cognitive communication deficit (R41.841)   Activity Tolerance Patient limited by lethargy;Patient limited by pain   Patient Left in bed;with call bell/phone within reach;with bed alarm set;with family/visitor  present   Nurse Communication Mobility status        Time: 8384-8364 OT Time Calculation (min): 20 min  Charges: OT General Charges $OT Visit: 1 Visit OT Treatments $Self Care/Home Management : 8-22 mins   Delanna JINNY Lesches, OTR/L 05/18/2024, 5:45 PM

## 2024-05-18 NOTE — Consult Note (Signed)
 "                                                                                   Consultation Note Date: 05/18/2024   Patient Name: Nancy Bell  DOB: August 06, 1924  MRN: [id number]  Age / Sex: 89 y.o., female  PCP: Pia Kerney SQUIBB, MD Referring Physician: Christobal Guadalajara, MD  Reason for Consultation: Establishing goals of care  HPI/Patient Profile: 89 y.o. female admitted on 05/13/2024    Clinical Assessment and Goals of Care: 89 year old lady from memory care unit with a life-limiting illness of advanced dementia, also with history of hypertension anxiety has been admitted to hospital medicine service after she was brought to the emergency department.  Patient reportedly had been complaining of left-sided chest/shoulder discomfort and additional workup showed large left pneumothorax, status post chest tube placement that was subsequently discontinued.  Remains admitted under hospital medicine with PT OT working closely as well as TOC colleagues attempted to determine safe disposition likely skilled nursing facility Palliative care has been consulted for ongoing goals of care discussions. Chart reviewed Patient seen and discussed with 2 daughters at bedside.  Palliative medicine is specialized medical care for people living with serious illness. It focuses on providing relief from the symptoms and stress of a serious illness. The goal is to improve quality of life for both the patient and the family. Goals of care: Broad aims of medical therapy in relation to the patient's values and preferences. Our aim is to provide medical care aimed at enabling patients to achieve the goals that matter most to them, given the circumstances of their particular medical situation and their constraints.      HCPOA 2 daughters Channing and Candis present at the bedside  SUMMARY OF RECOMMENDATIONS   1.  Care coordination/disposition Anticipate discharge to skilled nursing facility once placement is available,  discussed with [facility] colleague as well as with 2 daughters present at the bedside.  Patient previously was under [facility] palliative services at her memory care unit.  Today, at the time of initial consultation, discussed with the family about differences between hospice versus palliative.  Offered recommendation for switching to full hospice support once SNF rehab trial is done and once the patient is in the long-term facility 2.  Goals of care Goals of care clarified with both daughters at bedside as well as outside the room CODE STATUS is DO NOT RESUSCITATE No artificial feeding tube short-term SNF for rehab with palliative involvement and subsequently transition to long-term care with hospice. 3.  Poor oral intake/failure to thrive frailty with poor baseline intake prognosis remains poor, encourage oral care and oral intake as tolerated, daughter feeding the patient dark chocolate at bedside today at the time of initial consultation.  No feeding tube as per goals of care discussions. 4.  Anxiety disorder on chronic benzodiazepines Patient with longstanding benzodiazepine use, monitor for oversedation and respiratory suppression however continue home regimen for comfort 5.  Advanced dementia with behavioral disturbances history of progressive cognitive decline with recent increased agitation and combativeness which is episodic.  At present patient is not agitated has meaningful interaction and recognizes family in the room.  Home psychiatric regimen noted to be Cymbalta  Remeron  trazodone  Ativan  Zyprexa  been tried here in the hospital recommend maintaining delirium precautions and calm environment. Code Status/Advance Care Planning: DNR   Symptom Management:     Palliative Prophylaxis:  Oral Care  Additional Recommendations (Limitations, Scope, Preferences): No Artificial Feeding  Psycho-social/Spiritual:  Desire for further Chaplaincy support:yes Additional Recommendations: Education on  Hospice  Prognosis:  < 12 months  Discharge Planning: Skilled Nursing Facility for rehab with Palliative care service follow-up      Primary Diagnoses: Present on Admission:  Pneumothorax on left  Anxiety disorder  Essential hypertension   I have reviewed the medical record, interviewed the patient and family, and examined the patient. The following aspects are pertinent.  Past Medical History:  Diagnosis Date   Anxiety    Cancer (HCC)    right breast   History of hiatal hernia    Hyperlipidemia    Hypertension    Social History   Socioeconomic History   Marital status: Widowed    Spouse name: Not on file   Number of children: Not on file   Years of education: Not on file   Highest education level: Not on file  Occupational History   Not on file  Tobacco Use   Smoking status: Never   Smokeless tobacco: Never   Tobacco comments:    never used tobacco  Vaping Use   Vaping status: Never Used  Substance and Sexual Activity   Alcohol use: No    Alcohol/week: 0.0 standard drinks of alcohol   Drug use: No   Sexual activity: Not Currently  Other Topics Concern   Not on file  Social History Narrative   Not on file   Social Drivers of Health   Tobacco Use: Low Risk (05/17/2024)   Patient History    Smoking Tobacco Use: Never    Smokeless Tobacco Use: Never    Passive Exposure: Not on file  Financial Resource Strain: Not on file  Food Insecurity: Low Risk (09/01/2023)   Received from [facility]   Epic    Within the past 12 months, you worried that your food would run out before you got money to buy more: Never true    Within the past 12 months, the food you bought just didn't last and you didn't have money to get more. : Never true  Transportation Needs: No Transportation Needs (09/01/2023)   Received from [facility]    In the past 12 months, has lack of reliable transportation kept you from medical appointments, meetings, work or from  getting things needed for daily living? : No  Physical Activity: Not on file  Stress: Not on file  Social Connections: Not on file  Depression (EYV7-0): Not on file  Alcohol Screen: Not on file  Housing: Low Risk (09/01/2023)   Received from [facility]   Epic    What is your living situation today?: I have a steady place to live    Think about the place you live. Do you have problems with any of the following? Choose all that apply:: None/None on this list  Utilities: Low Risk (09/01/2023)   Received from [facility]   Utilities    In the past 12 months has the electric, gas, oil, or water company threatened to shut off services in your home? : No  Health Literacy: Not on file   Family History  Problem Relation Age of Onset   Cancer Father  kidney   Cancer Son 36       testicular   Scheduled Meds:  DULoxetine   60 mg Oral Daily   enoxaparin  (LOVENOX ) injection  40 mg Subcutaneous Q24H   mirtazapine   15 mg Oral QHS   OLANZapine  zydis  5 mg Oral Daily   pantoprazole   40 mg Oral Daily   propranolol   40 mg Oral BID   senna-docusate  1 tablet Oral BID   sorbitol   30 mL Oral Once   tamsulosin   0.4 mg Oral QPC supper   Continuous Infusions: PRN Meds:.albuterol , fentaNYL  (SUBLIMAZE ) injection, LORazepam , methocarbamol  (ROBAXIN ) injection, ondansetron  **OR** ondansetron  (ZOFRAN ) IV, mouth rinse, oxyCODONE , traZODone  Medications Prior to Admission:  Prior to Admission medications  Medication Sig Start Date End Date Taking? Authorizing Provider  ALPRAZolam  (XANAX ) 0.25 MG tablet Take 0.125 mg by mouth as needed for anxiety. 01/23/15  Yes [provider]  diphenhydrAMINE (BENADRYL) 25 MG tablet Take 25 mg by mouth every 8 (eight) hours as needed for allergies or itching.   Yes [provider]  DULoxetine  (CYMBALTA ) 30 MG capsule Take 60 mg by mouth daily. 09/28/23  Yes [provider]  Ensure (ENSURE) Take 237 mLs by mouth as needed.   Yes [provider]  loperamide  (IMODIUM ) 2 MG capsule Take 2 mg by mouth every 8 (eight) hours as needed for diarrhea or loose stools. 05/14/23  Yes [provider]  LORazepam  (ATIVAN ) 0.5 MG tablet Take 0.5 mg by mouth 2 (two) times daily. 05/02/24  Yes [provider]  mirtazapine  (REMERON  SOL-TAB) 15 MG disintegrating tablet Take 15 mg by mouth at bedtime. 09/28/23  Yes [provider]  omeprazole (PRILOSEC) 20 MG capsule Take 20 mg by mouth daily.   Yes [provider]  ondansetron  (ZOFRAN -ODT) 4 MG disintegrating tablet Take 4 mg by mouth every 8 (eight) hours as needed for nausea or vomiting. 08/03/23  Yes [provider]  propranolol  (INDERAL ) 40 MG tablet Take 40 mg by mouth 2 (two) times daily. 04/24/24  Yes [provider]  tamsulosin  (FLOMAX ) 0.4 MG CAPS capsule Take 0.4 mg by mouth at bedtime. 09/28/23  Yes [provider]  traZODone  (DESYREL ) 100 MG tablet Take 100 mg by mouth at bedtime.   Yes [provider]   Allergies[1] Review of Systems Weak and frail appearing elderly lady Physical Exam Weak and frail appearing elderly lady resting in bed in Tracks daughters present in the room attempts to follow their commands Abdomen not distended Shallow regular breath sounds  Vital Signs: BP (!) 120/58 (BP Location: Right Arm)   Pulse 67   Temp 97.7 F (36.5 C) (Oral)   Resp 16   Ht 5' 3 (1.6 m)   Wt 53.4 kg   SpO2 96%   BMI 20.85 kg/m  Pain Scale: PAINAD POSS *See Group Information*: S-Acceptable,Sleep, easy to arouse Pain Score: Asleep   SpO2: SpO2: 96 % O2 Device:SpO2: 96 % O2 Flow Rate: .O2 Flow Rate (L/min): 4 L/min  IO: Intake/output summary:  Intake/Output Summary (Last 24 hours) at 05/18/2024 1538 Last data filed at 05/18/2024 1318 Gross per 24 hour  Intake 1462.5 ml  Output --  Net 1462.5 ml    LBM: Last BM Date : 05/17/24 Baseline Weight: Weight: 53.4 kg Most recent weight: Weight: 53.4 kg      Palliative Assessment/Data:   Palliative performance score 40%  Time In:   1400 Time Out: 1520 Time Total: 80 Greater than 50%  of this  time was spent counseling and coordinating care related to the above assessment and plan.  Signed by: Lonia Serve, MD   Please contact Palliative Medicine Team phone at [phone] for questions and concerns.  For individual provider: See Amion                 [1]  Allergies Allergen Reactions   Augmentin [Amoxicillin-Pot Clavulanate] Other (See Comments)    Develops C Diff   Vicodin [Hydrocodone-Acetaminophen ] Other (See Comments)    Insomnia    Hydrocodone    Oxycontin  [Oxycodone  Hcl] Other (See Comments)    Unknown reaction   Tylenol  [Acetaminophen ] Nausea And Vomiting    Chills    Ultram  [Tramadol  Hcl] Other (See Comments)    Unknown reaction   "

## 2024-05-18 NOTE — Plan of Care (Signed)

## 2024-05-18 NOTE — Progress Notes (Signed)
 " Nancy Bell  FMW:[id number] DOB: [birth year] DOA: 05/13/2024 PCP: Pia Kerney SQUIBB, MD  Brief Narrative/Hospital Course: Nancy Bell is a 89 y.o. female with PMH of advanced dementia, hypertension and anxiety, resident of memory care, who has recently been more combative and difficult to control and was found on the ground after the bed without obvious injury. She was brought to the ED for unclear reason and later complained of left-sided chest shoulder discomfort and workup showed large left pneumothor  s/p chest tube has been placed and hospitalist admission was requested on 05/13/24.  PCCM consulted for chest tube management> subsequently discontinued 1/5 PT OT working closely recommending SNF  Subjective: Seen and examined  Patient eyes open, at the bedside no meaningful conversation, but not agitated Overnight afebrile BP stable on 4 L nasal cannula  Patient did eat well yesterday-one meal charted as 100% intake  Assessment and plan:  Pneumothorax on left, traumatic Recent fall at memory care: workup showed large left pneumothor  s/p chest tube has been placed and hospitalist admission was requested on 05/13/24.PCCM consulted for chest tube management> subsequently discontinued 1/5 . Continue pain management muscle relaxant I-S as able  Advanced dementia with agitation At fall risk-with recent fall at facility unwitnessed Memory care resident Anxiety disorder on chronic benzo: Patient has been increasingly more agitated recently it seems.UA negative.  No obvious infection.  CT head CT C-spine no acute finding PTA on Cymbalta , Ativan  0.5 twice daily Remeron  15 bedtime, trazodone  100 bedtime, Xanax  as needed.  Continue same,, also placed on Zyprexa  as needed. Cont ptot, delirium precaution fall precaution.  Xanax  on hold.  Cont on home trazodone  Cymbalta ,Zyprexa  daily With a poor oral intake and failure to thrive prognosis appears poor, palliative care has been consulted.   Will DC IV fluids after today  If oral intake remains stable plan to discharge to SNF once available  Essential hypertension: BP remains stable on propranolol .  GERD: Continue PPI  Mobility: PT Orders: Active PT Follow up Rec: Recommend Snf, Barriers To Snf Placement - Toc To F/U With Patient/Family For D/C Plans1/11/2024 1532   DVT prophylaxis: enoxaparin  (LOVENOX ) injection 40 mg Start: 05/13/24 2200 Code Status:   Code Status: Limited: Do not attempt resuscitation (DNR) -DNR-LIMITED -Do Not Intubate/DNI  Family Communication: plan of care discussed with patient/ family at bedside updated. Patient status is: Remains hospitalized because of severity of illness Level of care: Progressive   Dispo: The patient is from: memory care            Anticipated disposition: TBD- SNF Objective: Vitals last 24 hrs: Vitals:   05/17/24 0546 05/17/24 1408 05/17/24 2109 05/18/24 0559  BP: 115/86 (!) 106/52 108/71 139/66  Pulse: 72 65 62 64  Resp: 18 16 16 20   Temp: 99 F (37.2 C) 99.1 F (37.3 C) 98.8 F (37.1 C) 97.8 F (36.6 C)  TempSrc:  Oral Oral Oral  SpO2: 95% 98% 95% 98%  Weight:      Height:        Physical Examination: General exam: Alert awake no meaningful interaction HEENT:Oral mucosa moist, Ear/Nose WNL grossly Respiratory system: Bilaterally clear BS,no use of accessory muscle Cardiovascular system: S1 & S2 +, No JVD. Gastrointestinal system: Abdomen soft,NT,ND, BS+ Nervous System: Alert awake Extremities: In fetal position  Skin: Warm, no rashes MSK: Normal muscle bulk,tone, power   Medications reviewed:  Scheduled Meds:  DULoxetine   60 mg Oral Daily   enoxaparin  (LOVENOX ) injection  40 mg  Subcutaneous Q24H   mirtazapine   15 mg Oral QHS   OLANZapine  zydis  5 mg Oral Daily   pantoprazole   40 mg Oral Daily   propranolol   40 mg Oral BID   senna-docusate  1 tablet Oral BID   sorbitol   30 mL Oral Once   tamsulosin   0.4 mg Oral QPC supper   Continuous  Infusions:  sodium chloride  75 mL/hr at 05/18/24 9356   Diet: Diet Order             Diet regular Room service appropriate? Yes; Fluid consistency: Thin  Diet effective now                   Data Reviewed: I have personally reviewed following labs and imaging studies ( see epic result tab) CBC: Recent Labs  Lab 05/13/24 1522 05/14/24 0417 05/15/24 0346 05/16/24 0428 05/17/24 0423  WBC 14.5* 11.8* 7.3 9.1 11.6*  NEUTROABS 12.1*  --  4.7  --   --   HGB 15.0 13.3 11.3* 12.4 11.3*  HCT 46.4* 41.0 35.4* 36.6 33.7*  MCV 88.7 88.7 90.1 87.6 87.1  PLT 255 204 156 146* 187   CMP: Recent Labs  Lab 05/13/24 1522 05/14/24 0417 05/15/24 0346 05/16/24 0428 05/17/24 0423  NA 143 142 143 141 140  K 4.1 3.9 3.4* 4.1 3.7  CL 108 109 112* 110 109  CO2 25 23 23  19* 22  GLUCOSE 127* 118* 76 91 114*  BUN 14 15 19 17 16   CREATININE 0.83 0.78 0.70 0.68 0.64  CALCIUM 9.3 8.7* 8.1* 8.3* 8.1*  MG  --   --   --   --  1.8   GFR: Estimated Creatinine Clearance: 33.3 mL/min (by C-G formula based on SCr of 0.64 mg/dL). No results for input(s): AST, ALT, ALKPHOS, BILITOT, PROT, ALBUMIN in the last 168 hours. No results for input(s): LIPASE, AMYLASE in the last 168 hours. No results for input(s): AMMONIA in the last 168 hours. Coagulation Profile: No results for input(s): INR, PROTIME in the last 168 hours. Unresulted Labs (From admission, onward)    None      Antimicrobials/Microbiology: Anti-infectives (From admission, onward)    None         Component Value Date/Time   SDES  05/14/2024 1355    URINE, CATHETERIZED Performed at [facility], [address] [place], [place] [address]    SPECREQUEST  05/14/2024 1355    NONE Performed at [facility], [address] [place], [place] [address]    CULT  05/14/2024 1355    NO GROWTH Performed at [facility], [address], [place], [place] [address]     REPTSTATUS 05/16/2024 FINAL 05/14/2024 1355  Procedures:  Mennie LAMY, MD [facility] 05/18/2024, 10:44 AM   "

## 2024-05-18 NOTE — TOC Progression Note (Addendum)
 Transition of Care [facility]) - Progression Note    Patient Details  Name: LASHANDRA ARAUZ MRN: [id number] Date of Birth: 1924-07-07  [facility] CM/SW Contact  Tawni [name], LCSW Phone Number: 05/18/2024, 9:55 AM  Clinical Narrative:     CSW reached out to [facility] for review. ICM to follow.   Adden  2:30pm [facility] has declined the pt for SNF placement. CSW spoke with pt's daughter and provided an update. Pt's daughter requested CSW to reach out to [facility] and rehab. CSW spoke with Fairy with [facility] he stated he will review and follow back up. ICM to follow.                Expected Discharge Plan and Services       Living arrangements for the past 2 months: Assisted Living Facility                                       Social Drivers of Health (SDOH) Interventions SDOH Screenings   Food Insecurity: Low Risk (09/01/2023)   Received from [facility]  Housing: Low Risk (09/01/2023)   Received from [facility]  Transportation Needs: No Transportation Needs (09/01/2023)   Received from [facility]  Utilities: Low Risk (09/01/2023)   Received from [facility]  Tobacco Use: Low Risk (05/17/2024)    Readmission Risk Interventions     No data to display

## 2024-05-19 ENCOUNTER — Other Ambulatory Visit: Payer: Self-pay

## 2024-05-19 DIAGNOSIS — J939 Pneumothorax, unspecified: Secondary | ICD-10-CM | POA: Diagnosis not present

## 2024-05-19 DIAGNOSIS — Z515 Encounter for palliative care: Secondary | ICD-10-CM | POA: Diagnosis not present

## 2024-05-19 MED ORDER — OLANZAPINE 5 MG PO TABS
5.0000 mg | ORAL_TABLET | Freq: Every day | ORAL | Status: DC
  Administered 2024-05-19: 5 mg via ORAL
  Filled 2024-05-19: qty 1

## 2024-05-19 MED ORDER — CARMEX CLASSIC LIP BALM EX OINT
TOPICAL_OINTMENT | CUTANEOUS | Status: DC | PRN
  Filled 2024-05-19: qty 10

## 2024-05-19 NOTE — Progress Notes (Signed)
 " PROGRESS NOTE Nancy Bell  FMW:[id number] DOB: 1924/12/03 DOA: 05/13/2024 PCP: Pia Kerney SQUIBB, MD  Brief Narrative/Hospital Course: Nancy Bell is a 89 y.o. female with PMH of advanced dementia, hypertension and anxiety, resident of memory care, who has recently been more combative and difficult to control and was found on the ground after the bed without obvious injury. She was brought to the ED for unclear reason and later complained of left-sided chest shoulder discomfort and workup showed large left pneumothor  s/p chest tube has been placed and hospitalist admission was requested on 05/13/24.  PCCM consulted for chest tube management> subsequently discontinued 1/5 PT OT working closely recommending SNF  Subjective: Seen and examined  Patient is more alert awake calm, son-in-law at the bedside She did eat breakfast this morning-fair amount Issue with incontinence overnight but afebrile vital stable on 3 L nasal cannula  Assessment and plan:  Pneumothorax on left, traumatic Recent fall at memory care: workup showed large left pneumothor  s/p chest tube has been placed and hospitalist admission was requested on 05/13/24.PCCM consulted for chest tube management> subsequently discontinued 1/5 .  Continue pain management muscle relaxant I-S as able  Advanced dementia with agitation At fall risk-with recent fall at facility unwitnessed Memory care resident Anxiety disorder on chronic benzo: Patient has been increasingly more agitated recently it seems.UA negative.  No obvious infection.  CT head CT C-spine no acute finding PTA on Cymbalta , Ativan  0.5 twice daily Remeron  15 bedtime, trazodone  100 bedtime, Xanax  as needed.  Continue same,, also placed on Zyprexa  as needed. Cont ptot, delirium precaution fall precaution.  Xanax  on hold.  Cont on home trazodone  Cymbalta ,Zyprexa  daily Overall oral intake improving, off IV fluids.   Palliative has been consulted with plan for SNF at this  time. If her oral intake not enough she is hide high risk for dehydration and readmission decompensation  Essential hypertension: BP remains stable on propranolol .  GERD: Continue PPI  Mobility: PT Orders: Active PT Follow up Rec: Recommend Snf, Barriers To Snf Placement - Toc To F/U With Patient/Family For D/C Plans1/11/2024 1532   DVT prophylaxis: enoxaparin  (LOVENOX ) injection 40 mg Start: 05/13/24 2200 Code Status:   Code Status: Limited: Do not attempt resuscitation (DNR) -DNR-LIMITED -Do Not Intubate/DNI  Family Communication: plan of care discussed with patient/ family at bedside updated. Patient status is: Remains hospitalized because of severity of illness Level of care: Progressive   Dispo: The patient is from: memory care            Anticipated disposition: TBD- SNF once available, remains stable Objective: Vitals last 24 hrs: Vitals:   05/18/24 2049 05/18/24 2100 05/19/24 0328 05/19/24 0818  BP: (!) 150/80 (!) 154/84 (!) 156/76   Pulse: 67 70    Resp: 18  19   Temp: 98.4 F (36.9 C) 97.6 F (36.4 C) 97.7 F (36.5 C)   TempSrc: Oral Oral Oral   SpO2: 97% 95% 99% 96%  Weight:      Height:        Physical Examination: General exam: AAO to self HEENT:Oral mucosa moist, Ear/Nose WNL grossly Respiratory system: Bilaterally clear BS,no use of accessory muscle Cardiovascular system: S1 & S2 +, No JVD. Gastrointestinal system: Abdomen soft,NT,ND, BS+ Nervous System: Alert awake, communicative Extremities: In fetal position  Skin: Warm, no rashes MSK: Normal muscle bulk,tone, power   Medications reviewed:  Scheduled Meds:  DULoxetine   60 mg Oral Daily   enoxaparin  (LOVENOX ) injection  40 mg  Subcutaneous Q24H   mirtazapine   15 mg Oral QHS   OLANZapine  zydis  5 mg Oral Daily   pantoprazole   40 mg Oral Daily   propranolol   40 mg Oral BID   senna-docusate  1 tablet Oral BID   sorbitol   30 mL Oral Once   tamsulosin   0.4 mg Oral QPC supper   Continuous  Infusions:   Diet: Diet Order             Diet regular Room service appropriate? Yes; Fluid consistency: Thin  Diet effective now                   Data Reviewed: I have personally reviewed following labs and imaging studies ( see epic result tab) CBC: Recent Labs  Lab 05/13/24 1522 05/14/24 0417 05/15/24 0346 05/16/24 0428 05/17/24 0423  WBC 14.5* 11.8* 7.3 9.1 11.6*  NEUTROABS 12.1*  --  4.7  --   --   HGB 15.0 13.3 11.3* 12.4 11.3*  HCT 46.4* 41.0 35.4* 36.6 33.7*  MCV 88.7 88.7 90.1 87.6 87.1  PLT 255 204 156 146* 187   CMP: Recent Labs  Lab 05/13/24 1522 05/14/24 0417 05/15/24 0346 05/16/24 0428 05/17/24 0423  NA 143 142 143 141 140  K 4.1 3.9 3.4* 4.1 3.7  CL 108 109 112* 110 109  CO2 25 23 23  19* 22  GLUCOSE 127* 118* 76 91 114*  BUN 14 15 19 17 16   CREATININE 0.83 0.78 0.70 0.68 0.64  CALCIUM 9.3 8.7* 8.1* 8.3* 8.1*  MG  --   --   --   --  1.8   GFR: Estimated Creatinine Clearance: 33.3 mL/min (by C-G formula based on SCr of 0.64 mg/dL). No results for input(s): AST, ALT, ALKPHOS, BILITOT, PROT, ALBUMIN in the last 168 hours. No results for input(s): LIPASE, AMYLASE in the last 168 hours. No results for input(s): AMMONIA in the last 168 hours. Coagulation Profile: No results for input(s): INR, PROTIME in the last 168 hours. Unresulted Labs (From admission, onward)    None      Antimicrobials/Microbiology: Anti-infectives (From admission, onward)    None         Component Value Date/Time   SDES  05/14/2024 1355    URINE, CATHETERIZED Performed at [facility], [address], [place], [place] [address]    SPECREQUEST  05/14/2024 1355    NONE Performed at [facility], [address], [place], [place] [address]    CULT  05/14/2024 1355    NO GROWTH Performed at [facility], [address] [place], [place] [address]    REPTSTATUS 05/16/2024 FINAL 05/14/2024 1355   Procedures:  Mennie LAMY, MD [facility] 05/19/2024, 10:20 AM   "

## 2024-05-19 NOTE — TOC Progression Note (Signed)
 Transition of Care [facility]) - Progression Note    Patient Details  Name: Nancy Bell MRN: [id number] Date of Birth: 11-25-1924  [facility]) CM/SW Contact  Tawni [name], LCSW Phone Number: 05/19/2024, 2:45 PM  Clinical Narrative:     CSW spoke with the pts daughters, Nancy Bell and Nancy Bell, to provide an update. [facility] declined the pt for SNF placement. The pts daughters have declined SNF placement at this time and would like the pt to return to [facility] at discharge.  CSW spoke with Corean at [facility], who stated that the pt can return; however, they will need an FL2, discharge summary, and home health orders. Corean stated that the facility is not able to accept the pt over the weekend. ICM to follow.  Expected Discharge Plan: Memory Care Barriers to Discharge: Continued Medical Work up               Expected Discharge Plan and Services       Living arrangements for the past 2 months: Assisted Living Facility                                       Social Drivers of Health (SDOH) Interventions SDOH Screenings   Food Insecurity: Low Risk (09/01/2023)   Received from [facility]  Housing: Low Risk (09/01/2023)   Received from [facility]  Transportation Needs: No Transportation Needs (09/01/2023)   Received from [facility]  Utilities: Low Risk (09/01/2023)   Received from [facility]  Tobacco Use: Low Risk (05/17/2024)    Readmission Risk Interventions     No data to display

## 2024-05-19 NOTE — Progress Notes (Signed)
 "                                                                                                                                                                                                          Daily Progress Note   Patient Name: Nancy Bell       Date: 05/19/2024 DOB: 1925/01/08  Age: 89 y.o. MRN#: [id number] Attending Physician: Christobal Guadalajara, MD Primary Care Physician: Pia Kerney SQUIBB, MD Admit Date: 05/13/2024  Reason for Consultation/Follow-up: Establishing goals of care  Subjective: Elderly lady resting in bed does not appear to have any acute distress or discomfort.  Length of Stay: 5  Current Medications: Scheduled Meds:   DULoxetine   60 mg Oral Daily   enoxaparin  (LOVENOX ) injection  40 mg Subcutaneous Q24H   mirtazapine   15 mg Oral QHS   OLANZapine  zydis  5 mg Oral Daily   pantoprazole   40 mg Oral Daily   propranolol   40 mg Oral BID   senna-docusate  1 tablet Oral BID   sorbitol   30 mL Oral Once   tamsulosin   0.4 mg Oral QPC supper    Continuous Infusions:   PRN Meds: albuterol , fentaNYL  (SUBLIMAZE ) injection, lip balm, LORazepam , methocarbamol  (ROBAXIN ) injection, ondansetron  **OR** ondansetron  (ZOFRAN ) IV, mouth rinse, oxyCODONE , traZODone   Physical Exam         Frail appearing lady resting in bed. No acute distress Regular work of breathing  Vital Signs: BP (!) 156/76 (BP Location: Right Arm)   Pulse 70   Temp 97.7 F (36.5 C) (Oral)   Resp 19   Ht 5' 3 (1.6 m)   Wt 53.4 kg   SpO2 96%   BMI 20.85 kg/m  SpO2: SpO2: 96 % O2 Device: O2 Device: Nasal Cannula O2 Flow Rate: O2 Flow Rate (L/min): 3 L/min  Intake/output summary:  Intake/Output Summary (Last 24 hours) at 05/19/2024 1218 Last data filed at 05/19/2024 0741 Gross per 24 hour  Intake 994.89 ml  Output 950 ml  Net 44.89 ml   LBM: Last BM Date : 05/18/24 Baseline Weight: Weight: 53.4 kg Most recent weight: Weight: 53.4 kg       Palliative Assessment/Data:      Patient Active  Problem List   Diagnosis Date Noted   Dementia with behavioral disturbance (HCC) 05/14/2024   Sundowning 05/14/2024   Gastroesophageal reflux disease 05/14/2024   Fall 05/14/2024   Pneumothorax on left 05/13/2024   Protein-calorie malnutrition, severe 04/17/2023   Weakness 04/12/2023   Intractable nausea and vomiting 10/01/2022   Hypokalemia 10/01/2022  Age-related osteoporosis without current pathological fracture 09/30/2021   Anxiety disorder 09/30/2021   Dysphagia 09/30/2021   Essential hypertension 09/30/2021   Pure hypercholesterolemia 09/30/2021   Hyperglycemia 09/30/2021   Atherosclerosis of abdominal aorta 09/30/2021   Cancer of left female breast (HCC) 09/30/2020   Hx breast cancer, IDC right LIQ,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 Remains admitted under Hospital Medicine. Palliative Medicine following for goals-of-care support and care coordination.  Assessment and Recommendations/Plan: DO NOT RESUSCITATE, no artificial feeding tube per goals of care discussions, plan short-term SNF rehabilitation with palliative care involvement followed by transition to long-term care with hospice once rehab trial is complete TOC actively working with family on placement Frailty, poor oral intake failure to thrive Poor baseline oral intake advanced frailty prognosis remains limited Encourage oral intake and oral care as tolerated consistent with comfort-focused goals No tube feeding - goals of care Anxiety disorder/chronic benzodiazepine use longstanding benzodiazepine use is noted, continue home regimen while monitoring for oversedation and respiratory suppression   Goals of Care and  Additional Recommendations: Limitations on Scope of Treatment: No Artificial Feeding  Code Status:    Code Status Orders  (From admission, onward)           Start     Ordered   05/13/24 1827  Do not attempt resuscitation (DNR)- Limited -Do Not Intubate (DNI)  Continuous       Question Answer Comment  If pulseless and not breathing No CPR or chest compressions.   In Pre-Arrest Conditions (Patient Is Breathing and Has A Pulse) Do not intubate. Provide all appropriate non-invasive medical interventions. Avoid ICU transfer unless indicated or required.   Consent: Discussion documented in EHR or advanced directives reviewed      05/13/24 1829           Code Status History     Date Active Date Inactive Code Status Order ID Comments User Context   04/12/2023 2113 04/20/2023 2316 Full Code [id number]  Dena Charleston, MD ED   10/01/2022 1444 10/02/2022 2214 Full Code [id number]  Seena Marsa NOVAK, MD ED   09/30/2020 1651 10/02/2020 2045 Full Code [id number]  Curvin Deward MOULD, MD Inpatient      Advance Directive Documentation    Flowsheet Row Most Recent Value  Type of Advance Directive Out of facility DNR (pink MOST or yellow form)  Pre-existing out of facility DNR order (yellow form or pink MOST form) --  MOST Form in Place? --    Prognosis:  < 12 months  Discharge Planning: Skilled Nursing Facility for rehab with Palliative care service follow-up  Care plan was discussed with  IDT  Thank you for allowing the Palliative Medicine Team to assist in the care of this patient.  Low MDM    Greater than 50%  of this time was spent counseling and coordinating care related to the above assessment and plan.  Lonia Serve, MD  Please contact Palliative Medicine Team phone at [phone] for questions and concerns.       "

## 2024-05-20 DIAGNOSIS — J939 Pneumothorax, unspecified: Secondary | ICD-10-CM | POA: Diagnosis not present

## 2024-05-20 DIAGNOSIS — Z7189 Other specified counseling: Secondary | ICD-10-CM

## 2024-05-20 DIAGNOSIS — Z515 Encounter for palliative care: Secondary | ICD-10-CM

## 2024-05-20 MED ORDER — IBUPROFEN 200 MG PO TABS
200.0000 mg | ORAL_TABLET | Freq: Four times a day (QID) | ORAL | Status: DC | PRN
  Administered 2024-05-20 – 2024-05-21 (×2): 200 mg via ORAL
  Filled 2024-05-20 (×3): qty 1

## 2024-05-20 MED ORDER — ZINC OXIDE 40 % EX OINT
TOPICAL_OINTMENT | Freq: Two times a day (BID) | CUTANEOUS | Status: DC
  Filled 2024-05-20 (×2): qty 57

## 2024-05-20 NOTE — TOC Progression Note (Signed)
 Transition of Care [facility]) - Progression Note    Patient Details  Name: Nancy Bell MRN: [id number] Date of Birth: 1924-11-18  Transition [facility]) CM/SW Contact  Lorraine LILLETTE [place], [place] Phone Number: 05/20/2024, 2:42 PM  Clinical Narrative:     [place] from Clinical team advising a goals of care discussion held, family wishes eval for [facility] for symptom mgmt of pain/agitation. 89 yo patient with adv dementia. [facility] added and Delon Babe agreed to speak with family.  ICM following.   Expected Discharge Plan: Memory Care Barriers to Discharge: Continued Medical Work up               Expected Discharge Plan and Services       Living arrangements for the past 2 months: Assisted Living Facility                                       Social Drivers of Health (SDOH) Interventions SDOH Screenings   Food Insecurity: Low Risk (09/01/2023)   Received from [facility]  Housing: Low Risk (09/01/2023)   Received from [facility]  Transportation Needs: No Transportation Needs (09/01/2023)   Received from [facility]  Utilities: Low Risk (09/01/2023)   Received from [facility]  Tobacco Use: Low Risk (05/17/2024)    Readmission Risk Interventions     No data to display

## 2024-05-20 NOTE — Progress Notes (Signed)
 " Nancy Bell  FMW:[id number] DOB: December 01, 1924 DOA: 05/13/2024 PCP: Pia Kerney SQUIBB, MD  Brief Narrative/Hospital Course: Nancy Bell is a 89 y.o. female with PMH of advanced dementia, hypertension and anxiety, resident of memory care, who has recently been more combative and difficult to control and was found on the ground after the bed without obvious injury. She was brought to the ED for unclear reason and later complained of left-sided chest shoulder discomfort and workup showed large left pneumothor  s/p chest tube has been placed and hospitalist admission was requested on 05/13/24.  PCCM consulted for chest tube management> subsequently discontinued 1/5 PT OT working closely recommending SNF, difficult to obtain. At this time daughter requesting for her to return to [facility].  Subjective: Seen and examined  Eyes open but no meaningful interactions, has not eaten anything since yesterday, and minimally responsive Daughters at the bedside Overnight patient remains afebrile BP stable on room air Nurse reports patient with elevated sedated after oxycodone  changed to ibuprofen , Tylenol   listed as allergy  Assessment and plan:  Pneumothorax on left, traumatic Recent fall at memory care: workup showed large left pneumothor  s/p chest tube has been placed and hospitalist admission was requested on 05/13/24.PCCM consulted for chest tube management> subsequently discontinued 1/5 . Doing well respiratory wise. Encourage incentive spirometry.  Continue pain management-minimize opiates, added Advil .  Advanced dementia with agitation At fall risk-with recent fall at facility unwitnessed Memory care resident Anxiety disorder on chronic benzo: Patient has been increasingly more agitated recently it seems.UA negative.  No obvious infection.  CT head CT C-spine no acute finding PTA on Cymbalta , Ativan  0.5 twice daily Remeron  15 bedtime, trazodone  100 bedtime,  Continue  current medication for agitation along with Zyprexa , dose.Does get agitation ~ 2 PM  Palliative has been consulted with plan for SNF at this time-but family has decided to return to memory care. They want to look into hospice option at memory care and [facility] notified hospital bed ordered Given worsening mentation, poor intake-May qualify for [facility], family wanting to keep patient comfortable, they feel it may be too much to handle at [facility].  Minimally displaced fracture of the distal left clavicle: Cont brace  Essential hypertension: BP remains stable on propranolol .  GERD: Continue PPI  Mobility: PT Orders: Active PT Follow up Rec: Recommend Snf, Barriers To Snf Placement - Toc To F/U With Patient/Family For D/C Plans1/11/2024 1532   DVT prophylaxis: enoxaparin  (LOVENOX ) injection 40 mg Start: 05/13/24 2200 Code Status:   Code Status: Limited: Do not attempt resuscitation (DNR) -DNR-LIMITED -Do Not Intubate/DNI  Family Communication: plan of care discussed with patient/ family at bedside updated. Patient status is: Remains hospitalized because of severity of illness Level of care: Progressive   Dispo: The patient is from: memory care            Anticipated disposition: TBD likely hospice  Objective: Vitals last 24 hrs: Vitals:   05/19/24 2058 05/19/24 2226 05/20/24 0418 05/20/24 0422  BP: (!) 117/55 98/76 (!) 119/53 109/61  Pulse: 64 65 63 62  Resp: 19  15   Temp: 97.9 F (36.6 C)  98.5 F (36.9 C)   TempSrc: Oral  Oral   SpO2: 94%  90% 92%  Weight:      Height:       Physical Examination: General exam: Minimally responsive, eyes open HEENT:Oral mucosa moist, Ear/Nose WNL grossly Respiratory system: Bilaterally diminished  Cardiovascular system: S1 & S2 +,  No JVD. Gastrointestinal system: Abdomen soft,NT,ND, BS+ Nervous System: Minimally responsive  Extremities: In fetal position  Skin: Warm, no rashes MSK: Normal muscle bulk,tone, power  Data Reviewed: I have  personally reviewed following labs and imaging studies ( see epic result tab) CBC: Recent Labs  Lab 05/13/24 1522 05/14/24 0417 05/15/24 0346 05/16/24 0428 05/17/24 0423  WBC 14.5* 11.8* 7.3 9.1 11.6*  NEUTROABS 12.1*  --  4.7  --   --   HGB 15.0 13.3 11.3* 12.4 11.3*  HCT 46.4* 41.0 35.4* 36.6 33.7*  MCV 88.7 88.7 90.1 87.6 87.1  PLT 255 204 156 146* 187   CMP: Recent Labs  Lab 05/13/24 1522 05/14/24 0417 05/15/24 0346 05/16/24 0428 05/17/24 0423  NA 143 142 143 141 140  K 4.1 3.9 3.4* 4.1 3.7  CL 108 109 112* 110 109  CO2 25 23 23  19* 22  GLUCOSE 127* 118* 76 91 114*  BUN 14 15 19 17 16   CREATININE 0.83 0.78 0.70 0.68 0.64  CALCIUM 9.3 8.7* 8.1* 8.3* 8.1*  MG  --   --   --   --  1.8   GFR: Estimated Creatinine Clearance: 33.3 mL/min (by C-G formula based on SCr of 0.64 mg/dL). No results for input(s): AST, ALT, ALKPHOS, BILITOT, PROT, ALBUMIN in the last 168 hours. No results for input(s): LIPASE, AMYLASE in the last 168 hours. No results for input(s): AMMONIA in the last 168 hours. Coagulation Profile: No results for input(s): INR, PROTIME in the last 168 hours. Unresulted Labs (From admission, onward)    None      Antimicrobials/Microbiology: Anti-infectives (From admission, onward)    None         Component Value Date/Time   SDES  05/14/2024 1355    URINE, CATHETERIZED Performed at [facility], [address], [place], [place] [address]    SPECREQUEST  05/14/2024 1355    NONE Performed at [facility], [address] [place], [place] [address]    CULT  05/14/2024 1355    NO GROWTH Performed at [facility], [address] [place], [place] [address]    REPTSTATUS 05/16/2024 FINAL 05/14/2024 1355  Procedures:  Mennie LAMY, MD [facility] 05/20/2024, 10:54 AM   "

## 2024-05-20 NOTE — Progress Notes (Addendum)
 "                                                                                                                                                                                                          Daily Progress Note   Patient Name: Nancy Bell       Date: 05/20/2024 DOB: Oct 04, 1924  Age: 89 y.o. MRN#: [id number] Attending Physician: Christobal Guadalajara, MD Primary Care Physician: Pia Kerney SQUIBB, MD Admit Date: 05/13/2024  Reason for Consultation/Follow-up: Establishing goals of care  Subjective: Elderly lady resting in bed  Periodically shows non verbal gestures of wincing grimacing.  Daughters and son in law at bedside A goals of care discussion took place along with Dr Christobal [facility] MD, SNF has been difficult to obtain and daughters had the decision to return to [facility], however, another goals of care discussion took place today, see below.     Length of Stay: 6  Current Medications: Scheduled Meds:   DULoxetine   60 mg Oral Daily   enoxaparin  (LOVENOX ) injection  40 mg Subcutaneous Q24H   mirtazapine   15 mg Oral QHS   OLANZapine  zydis  5 mg Oral Daily   pantoprazole   40 mg Oral Daily   propranolol   40 mg Oral BID   senna-docusate  1 tablet Oral BID   sorbitol   30 mL Oral Once   tamsulosin   0.4 mg Oral QPC supper    Continuous Infusions:   PRN Meds: albuterol , fentaNYL  (SUBLIMAZE ) injection, ibuprofen , lip balm, LORazepam , methocarbamol  (ROBAXIN ) injection, ondansetron  **OR** ondansetron  (ZOFRAN ) IV, mouth rinse, traZODone   Physical Exam         Frail appearing lady resting in bed. Mild to moderate acute distress - wincing and grimacing.  Has muscle wasting Minimally responsive Shallow diminished breath sounds.   Vital Signs: BP 109/61 (BP Location: Right Arm)   Pulse 62   Temp 98.5 F (36.9 C) (Oral)   Resp 15   Ht 5' 3 (1.6 m)   Wt 53.4 kg   SpO2 92%   BMI 20.85 kg/m  SpO2: SpO2: 92 % O2 Device: O2 Device: Room Air O2 Flow Rate: O2 Flow Rate (L/min): 2  L/min  Intake/output summary:  Intake/Output Summary (Last 24 hours) at 05/20/2024 1058 Last data filed at 05/20/2024 0645 Gross per 24 hour  Intake 220 ml  Output 600 ml  Net -380 ml   LBM: Last BM Date : 05/18/24 Baseline Weight: Weight: 53.4 kg Most recent weight: Weight: 53.4 kg       Palliative Assessment/Data:  Patient Active Problem List   Diagnosis Date Noted   Dementia with behavioral disturbance (HCC) 05/14/2024   Sundowning 05/14/2024   Gastroesophageal reflux disease 05/14/2024   Fall 05/14/2024   Pneumothorax on left 05/13/2024   Protein-calorie malnutrition, severe 04/17/2023   Weakness 04/12/2023   Intractable nausea and vomiting 10/01/2022   Hypokalemia 10/01/2022   Age-related osteoporosis without current pathological fracture 09/30/2021   Anxiety disorder 09/30/2021   Dysphagia 09/30/2021   Essential hypertension 09/30/2021   Pure hypercholesterolemia 09/30/2021   Hyperglycemia 09/30/2021   Atherosclerosis of abdominal aorta 09/30/2021   Cancer of left female breast (HCC) 09/30/2020   Hx breast cancer, IDC right LIQ,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 Remains admitted under Hospital Medicine. Palliative Medicine following for goals-of-care support and care coordination.  Assessment and Recommendations/Plan: 89 year old lady with advanced dementia now with significant ongoing functional and cognitive decline, poor oral intake essentially nil since the last 24 hours, increasing somnolence and decreased interaction consistent with late stage dementia and possibly also approaching end-of-life Goals of care were reviewed with the family today in the context  of expected decline from dementia trajectory We rediscussed DNR and no artificial feeding tube aligning with the patient's values and comfort based approach We discussed with her decreased appetite prolonged sleep reduced alertness and episodes of limited responsiveness versus agitation being very common and expected signs of active decline.  We discussed about not forcing nutrition or hydration because it would not improve quality of life or prognosis at this stage Patient's family was concerned about residential hospice facilities using a lot of opioids and sedation and whether that would shorten life expectancy.  Explained about judicious use of opioids and benzodiazepines for symptom management that is provided in the setting of a hospice facility.  Compared and contrasted comfort focused care to medical assistance in dying.  Reassured that going to a hospice facility would simply be for continuing comfort focused care and for aggressive symptom management at end-of-life and explained clearly, to the best of my ability about how that does NOT constitute medical assistance in dying. Patient's family had questions about the patient having normal vital signs.  We discussed that some patients can have normal vital signs even in the active dying process.  Additionally, I explained about the dying process and advanced dementia in some detail.  In advanced dementia, the dying process often unfolds as hypoactive delirium, characterized by progressively decreased alertness, minimal verbal interaction, increased sleeping, and reduced engagement with the environment although the patient could have episodic agitation or distress. Patients may appear calm but withdrawn, with fluctuating awareness, limited eye contact, and diminished response to voices or touch. Over time, oral intake naturally declines as the brain loses the ability to coordinate hunger, thirst, and swallowing, and periods of wakefulness become shorter  and less frequent. Breathing patterns may change, extremities may cool, and energy steadily wanes.  Explained that this trajectory is expected, reflects the bodys natural shutting down, and is generally not associated with suffering when comfort-focused care is maintained and symptoms are proactively managed, that can be most comfortably done inside a residential hospice setting I explained to the patient and family alongside hospital medicine physician with regards to the role of  residential hospice for intensive symptom management of pain dyspnea agitation anxiety secretions as well as the role of residential hospice and providing caregiver support inside a nonhospital setting Family demonstrates understanding and is in agreement to proceed with evaluation for residential hospice Will request TOC assistance and hospice liaison assistance    Goals of Care and Additional Recommendations: Limitations on Scope of Treatment: No Artificial Feeding  Code Status:    Code Status Orders  (From admission, onward)           Start     Ordered   05/13/24 1827  Do not attempt resuscitation (DNR)- Limited -Do Not Intubate (DNI)  Continuous       Question Answer Comment  If pulseless and not breathing No CPR or chest compressions.   In Pre-Arrest Conditions (Patient Is Breathing and Has A Pulse) Do not intubate. Provide all appropriate non-invasive medical interventions. Avoid ICU transfer unless indicated or required.   Consent: Discussion documented in EHR or advanced directives reviewed      05/13/24 1829           Code Status History     Date Active Date Inactive Code Status Order ID Comments User Context   04/12/2023 2113 04/20/2023 2316 Full Code [id number]  Dena Charleston, MD ED   10/01/2022 1444 10/02/2022 2214 Full Code [id number]  Seena Marsa NOVAK, MD ED   09/30/2020 1651 10/02/2020 2045 Full Code [id number]  Curvin Deward MOULD, MD Inpatient      Advance Directive Documentation     Flowsheet Row Most Recent Value  Type of Advance Directive Out of facility DNR (pink MOST or yellow form)  Pre-existing out of facility DNR order (yellow form or pink MOST form) --  MOST Form in Place? --    Prognosis:  Less than 2 weeks.   Discharge Planning: Hospice facility  Care plan was discussed with  [facility] MD, 2 daughters and son in law in room Secure chat correspondence with TOC and [facility].  I personally spent a total of 50 minutes in the care of the patient today including preparing to see the patient, getting/reviewing separately obtained history, performing a medically appropriate exam/evaluation, counseling and educating, placing orders, referring and communicating with other health care professionals, documenting clinical information in the EHR, and coordinating care.   Thank you for allowing the Palliative Medicine Team to assist in the care of this patient.      Greater than 50%  of this time was spent counseling and coordinating care related to the above assessment and plan.  Lonia Serve, MD  Please contact Palliative Medicine Team phone at [phone] for questions and concerns.       "

## 2024-05-20 NOTE — Progress Notes (Addendum)
 Nancy Bell [address] [facility] Liaison Note:   Received request from Laverne, [employer] for family interest in [facility]. Chart reviewed and spoke with family to acknowledge referral. Hospice eligibility confirmed. Unfortunately [facility] is not able to offer a room today. Family and Care Manager aware hospital liaison will follow up tomorrow or sooner if room becomes available. Please do not hesitate to call with questions.    Thank you for the opportunity to participate in this patient's plan of care.    Nat Babe, BSN, [employer] [phone]

## 2024-05-21 DIAGNOSIS — J939 Pneumothorax, unspecified: Secondary | ICD-10-CM | POA: Diagnosis not present

## 2024-05-21 DIAGNOSIS — Z515 Encounter for palliative care: Secondary | ICD-10-CM | POA: Diagnosis not present

## 2024-05-21 DIAGNOSIS — Z7189 Other specified counseling: Secondary | ICD-10-CM | POA: Diagnosis not present

## 2024-05-21 NOTE — TOC Progression Note (Signed)
 Transition of Care [facility]) - Progression Note    Patient Details  Name: Nancy Bell MRN: [id number] Date of Birth: 1924/12/15  [facility] CM/SW Contact  Lorraine LILLETTE Fenton, [place] Phone Number: 05/21/2024, 2:22 PM  Clinical Narrative:    Plan for pt is [facility]- [facility]. Alroy checked in today and advised that there are no open beds. ICM following.    Expected Discharge Plan: Memory Care Barriers to Discharge: Hospice Bed not available               Expected Discharge Plan and Services       Living arrangements for the past 2 months: Assisted Living Facility                                       Social Drivers of Health (SDOH) Interventions SDOH Screenings   Food Insecurity: Low Risk (09/01/2023)   Received from [facility]  Housing: Low Risk (09/01/2023)   Received from [facility]  Transportation Needs: No Transportation Needs (09/01/2023)   Received from [facility]  Utilities: Low Risk (09/01/2023)   Received from [facility]  Tobacco Use: Low Risk (05/17/2024)    Readmission Risk Interventions     No data to display

## 2024-05-21 NOTE — Progress Notes (Signed)
 Nancy Bell [address] [facility] Liaison Note:   Hospice eligibility confirmed. Unfortunately [facility] is not able to offer a room today. Family and Care Manager aware hospital liaison will follow up tomorrow or sooner if room becomes available. Please do not hesitate to call with questions.    Thank you for the opportunity to participate in this patient's plan of care.  Nat Babe, BSN, [employer] [phone]

## 2024-05-21 NOTE — Progress Notes (Signed)
 "                                                                                                                                                                                                          Daily Progress Note   Patient Name: Nancy Bell       Date: 05/21/2024 DOB: 1924-09-13  Age: 89 y.o. MRN#: [id number] Attending Physician: Christobal Guadalajara, MD Primary Care Physician: Pia Kerney SQUIBB, MD Admit Date: 05/13/2024  Reason for Consultation/Follow-up: Establishing goals of care  Subjective: Elderly lady resting in bed  Awake this morning, able to speak a few words.  Daughters and son in law at bedside I followed up on the goals of care discussion that took place on 05-20-24 - family intends to continue to proceed with residential hospice placement, [facility], focus on comfort measures, we discussed about comfort care today,   Patient required 3 doses of 25 mcg each of IV Fentanyl  in the past 24 hours.     Length of Stay: 7  Current Medications: Scheduled Meds:   DULoxetine   60 mg Oral Daily   enoxaparin  (LOVENOX ) injection  40 mg Subcutaneous Q24H   liver oil-zinc  oxide   Topical BID   mirtazapine   15 mg Oral QHS   OLANZapine  zydis  5 mg Oral Daily   pantoprazole   40 mg Oral Daily   propranolol   40 mg Oral BID   senna-docusate  1 tablet Oral BID   sorbitol   30 mL Oral Once   tamsulosin   0.4 mg Oral QPC supper    Continuous Infusions:   PRN Meds: albuterol , fentaNYL  (SUBLIMAZE ) injection, ibuprofen , lip balm, LORazepam , methocarbamol  (ROBAXIN ) injection, ondansetron  **OR** ondansetron  (ZOFRAN ) IV, mouth rinse, traZODone   Physical Exam         Frail appearing lady resting in bed. Awake, but appears with generalized weakness Has muscle wasting   Shallow diminished breath sounds.   Vital Signs: BP (!) 146/77 (BP Location: Right Arm)   Pulse 67   Temp 97.8 F (36.6 C) (Oral)   Resp 18   Ht 5' 3 (1.6 m)   Wt 53.4 kg   SpO2 94%   BMI 20.85 kg/m  SpO2: SpO2:  94 % O2 Device: O2 Device: Room Air O2 Flow Rate: O2 Flow Rate (L/min): 2 L/min  Intake/output summary:  Intake/Output Summary (Last 24 hours) at 05/21/2024 1123 Last data filed at 05/21/2024 0100 Gross per 24 hour  Intake 60 ml  Output 0 ml  Net 60 ml   LBM: Last BM Date : 05/20/24  Baseline Weight: Weight: 53.4 kg Most recent weight: Weight: 53.4 kg       Palliative Assessment/Data:      Patient Active Problem List   Diagnosis Date Noted   Palliative care by specialist 05/20/2024   Dying care 05/20/2024   Coordination of complex care 05/20/2024   Dementia with behavioral disturbance (HCC) 05/14/2024   Sundowning 05/14/2024   Gastroesophageal reflux disease 05/14/2024   Fall 05/14/2024   Pneumothorax on left 05/13/2024   Protein-calorie malnutrition, severe 04/17/2023   Weakness 04/12/2023   Intractable nausea and vomiting 10/01/2022   Hypokalemia 10/01/2022   Age-related osteoporosis without current pathological fracture 09/30/2021   Anxiety disorder 09/30/2021   Dysphagia 09/30/2021   Essential hypertension 09/30/2021   Pure hypercholesterolemia 09/30/2021   Hyperglycemia 09/30/2021   Atherosclerosis of abdominal aorta 09/30/2021   Cancer of left female breast (HCC) 09/30/2020   Hx breast cancer, IDC right LIQ,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 Remains admitted under Hospital Medicine. Palliative Medicine following for goals-of-care support and care coordination.  Assessment and Recommendations/Plan: 89 year old lady with advanced dementia now with significant ongoing functional and cognitive decline, poor oral intake consistent with late stage dementia and possibly  also approaching end-of-life Goals of care were reviewed with the family again today in the context of expected decline from dementia trajectory  DNR - comfort measures only Proceed with residential hospice transfer when bed available.   Less Than 2 Days Before Death In the final hours, patients exhibit specific clinical signs that indicate the approach of death.  Clinical Signs:  1.Death rattle  Management Techniques: Educate the family about the nature of the sound Reposition the patient to promote drainage Consider using anticholinergic medications if the patient is suffering.  2.Apnea. Management Techniques:  Educate the family about the possibility of irregular breathing patterns Administer opioids if dyspnea is present.  3.Respiration with mandibular movement  Management Techniques: Educate the family about changes in breathing patterns.  4.Decreased urine output  Management Techniques: Educate the family about the natural decrease in bodily functions.  5.Pulselessness of radial artery  Management Techniques: Educate the family about changes in circulation and perfusion.  6.Inability to close eyelids  Management Techniques: Educate the family about the physical changes in muscle control Use a wet washcloth if eyes are dry or irritated.  7.Grunting of vocal cords  Management Techniques: Educate the family the family about the possibility of vocal cord tension Administer opioids if pain is present.  8.Fever  Management Techniques: Place a cool washcloth on the patients forehead Remove excess blankets Use a fan Administer acetominophin if necessary    Goals of Care and Additional Recommendations: Limitations on Scope of Treatment: No Artificial Feeding  Code Status:    Code Status Orders  (From admission, onward)           Start     Ordered   05/13/24 1827  Do not attempt resuscitation (DNR)- Limited -Do Not Intubate (DNI)  Continuous        Question Answer Comment  If pulseless and not breathing No CPR or chest compressions.   In Pre-Arrest Conditions (Patient Is Breathing and Has A Pulse) Do not intubate. Provide all appropriate non-invasive medical interventions. Avoid ICU transfer  unless indicated or required.   Consent: Discussion documented in EHR or advanced directives reviewed      05/13/24 1829           Code Status History     Date Active Date Inactive Code Status Order ID Comments User Context   04/12/2023 2113 04/20/2023 2316 Full Code [id number]  Dena Charleston, MD ED   10/01/2022 1444 10/02/2022 2214 Full Code [id number]  Seena Marsa NOVAK, MD ED   09/30/2020 1651 10/02/2020 2045 Full Code [id number]  Curvin Deward MOULD, MD Inpatient      Advance Directive Documentation    Flowsheet Row Most Recent Value  Type of Advance Directive Out of facility DNR (pink MOST or yellow form)  Pre-existing out of facility DNR order (yellow form or pink MOST form) --  MOST Form in Place? --    Prognosis:  Less than 2 weeks.   Discharge Planning: Hospice facility  Care plan was discussed with  patient,daughter and son in law in room, [facility] liaison via secure chat.    I personally spent a total of 35 minutes in the care of the patient today including preparing to see the patient, getting/reviewing separately obtained history, performing a medically appropriate exam/evaluation, counseling and educating, placing orders, referring and communicating with other health care professionals, documenting clinical information in the EHR, and coordinating care.   Thank you for allowing the Palliative Medicine Team to assist in the care of this patient.      Greater than 50%  of this time was spent counseling and coordinating care related to the above assessment and plan.  Lonia Serve, MD  Please contact Palliative Medicine Team phone at [phone] for questions and concerns.       "

## 2024-05-21 NOTE — Plan of Care (Incomplete)
" °  Problem: Clinical Measurements: Goal: Diagnostic test results will improve Outcome: Progressing Goal: Respiratory complications will improve Outcome: Progressing Goal: Cardiovascular complication will be avoided Outcome: Progressing   Problem: Coping: Goal: Level of anxiety will decrease Outcome: Progressing   Problem: Elimination: Goal: Will not experience complications related to bowel motility Outcome: Progressing   Problem: Clinical Measurements: Goal: Ability to maintain clinical measurements within normal limits will improve Outcome: Not Progressing Goal: Will remain free from infection Outcome: Not Progressing   Problem: Nutrition: Goal: Adequate nutrition will be maintained Outcome: Not Progressing   "

## 2024-05-21 NOTE — Progress Notes (Signed)
 " PROGRESS NOTE [name]  FMW:[id number] DOB: 1924-05-27 DOA: 05/13/2024 PCP: Pia Kerney SQUIBB, MD  Brief Narrative/Hospital Course: Nancy Bell is a 89 y.o. female with PMH of advanced dementia, hypertension and anxiety, resident of memory care, who has recently been more combative and difficult to control and was found on the ground after the bed without obvious injury. She was brought to the ED for unclear reason and later complained of left-sided chest shoulder discomfort and workup showed large left pneumothor  s/p chest tube has been placed and hospitalist admission was requested on 05/13/24.  PCCM consulted for chest tube management> subsequently discontinued 1/5 PT OT working closely recommending SNF, difficult to obtain. At this time daughter requesting for her to return to [facility].  Subjective: Seen and examined  Just had bowel movement x 2 soft Appears alert awake aids little bit Emily at the bedside Overnight afebrile BP stable No labs today Patient getting olanzapine  in the evening, also on Ativan , fentanyl  Robaxin  as needed, and trazodone  nightly  Assessment and plan:  Pneumothorax on left, traumatic Recent fall at memory care: workup showed large left pneumothor  s/p chest tube has been placed and hospitalist admission was requested on 05/13/24.PCCM consulted for chest tube management> subsequently discontinued 1/5 . Doing well respiratory wise. Encourage incentive spirometry.  Continue pain management as per palliative care   Advanced dementia with agitation At fall risk-with recent fall at facility unwitnessed Memory care resident Anxiety disorder on chronic benzo: Patient has been increasingly more agitated recently it seems.UA negative.  No obvious infection.  CT head CT C-spine no acute finding PTA on Cymbalta , Ativan  0.5 twice daily Remeron  15 bedtime, trazodone  100 bedtime,  Continue current medication for agitation along with Zyprexa , dose.Does  get agitation ~ 2 PM  Palliative has been consulted plan for Hospice  Minimally displaced fracture of the distal left clavicle: Cont brace  Essential hypertension: BP remains stable on propranolol .  GERD: Continue PPI  Mobility: PT Orders: Active PT Follow up Rec: Recommend Snf, Barriers To Snf Placement - Toc To F/U With Patient/Family For D/C Plans1/11/2024 1532   DVT prophylaxis: enoxaparin  (LOVENOX ) injection 40 mg Start: 05/13/24 2200 Code Status:   Code Status: Do not attempt resuscitation (DNR) - Comfort care Family Communication: plan of care discussed with patient/ family at bedside updated. Patient status is: Remains hospitalized because of severity of illness Level of care: Progressive   Dispo: The patient is from: memory care            Anticipated disposition: TBD likely hospice  Objective: Vitals last 24 hrs: Vitals:   05/20/24 2157 05/20/24 2159 05/20/24 2204 05/21/24 0554  BP: 131/80   (!) 146/77  Pulse: (!) 38  86 67  Resp: 13 18  18   Temp: 98.2 F (36.8 C)   97.8 F (36.6 C)  TempSrc: Oral   Oral  SpO2: 97%   94%  Weight:      Height:       Physical Examination: General exam: Eyes open does not follow commands HEENT:Oral mucosa moist, Ear/Nose WNL grossly Respiratory system: Bilaterally diminished  Cardiovascular system: S1 & S2 +, No JVD. Gastrointestinal system: Abdomen soft,NT,ND, BS+ Nervous System: Eyes open, not following commands Extremities: In fetal position  Skin: Warm, no rashes MSK: Normal muscle bulk,tone, power  Data Reviewed: I have personally reviewed following labs and imaging studies ( see epic result tab) CBC: Recent Labs  Lab 05/15/24 0346 05/16/24 0428 05/17/24 0423  WBC 7.3 9.1 11.6*  NEUTROABS 4.7  --   --   HGB 11.3* 12.4 11.3*  HCT 35.4* 36.6 33.7*  MCV 90.1 87.6 87.1  PLT 156 146* 187   CMP: Recent Labs  Lab 05/15/24 0346 05/16/24 0428 05/17/24 0423  NA 143 141 140  K 3.4* 4.1 3.7  CL 112* 110 109  CO2  23 19* 22  GLUCOSE 76 91 114*  BUN 19 17 16   CREATININE 0.70 0.68 0.64  CALCIUM 8.1* 8.3* 8.1*  MG  --   --  1.8   GFR: Estimated Creatinine Clearance: 33.3 mL/min (by C-G formula based on SCr of 0.64 mg/dL). No results for input(s): AST, ALT, ALKPHOS, BILITOT, PROT, ALBUMIN in the last 168 hours. No results for input(s): LIPASE, AMYLASE in the last 168 hours. No results for input(s): AMMONIA in the last 168 hours. Coagulation Profile: No results for input(s): INR, PROTIME in the last 168 hours. Unresulted Labs (From admission, onward)    None      Antimicrobials/Microbiology: Anti-infectives (From admission, onward)    None         Component Value Date/Time   SDES  05/14/2024 1355    URINE, CATHETERIZED Performed at [facility], [address], [place], [place] [address]    SPECREQUEST  05/14/2024 1355    NONE Performed at [facility], [address], [place], [place] [address]    CULT  05/14/2024 1355    NO GROWTH Performed at [facility], [address] [place], [place] [address]    REPTSTATUS 05/16/2024 FINAL 05/14/2024 1355  Procedures:  Mennie LAMY, MD [facility] 05/21/2024, 11:59 AM   "

## 2024-05-22 DIAGNOSIS — Z515 Encounter for palliative care: Secondary | ICD-10-CM | POA: Diagnosis not present

## 2024-05-22 DIAGNOSIS — J939 Pneumothorax, unspecified: Secondary | ICD-10-CM | POA: Diagnosis not present

## 2024-05-22 MED ORDER — OLANZAPINE 5 MG PO TBDP: 5.0000 mg | ORAL_TABLET | Freq: Every day | ORAL | Status: AC

## 2024-05-22 MED ORDER — SENNOSIDES-DOCUSATE SODIUM 8.6-50 MG PO TABS: 1.0000 | ORAL_TABLET | Freq: Two times a day (BID) | ORAL | Status: AC

## 2024-05-22 NOTE — Progress Notes (Signed)
 "                                                                                                                                                                                                          Daily Progress Note   Patient Name: Nancy Bell       Date: 05/22/2024 DOB: July 31, 1924  Age: 89 y.o. MRN#: [id number] Attending Physician: Christobal Guadalajara, MD Primary Care Physician: Pia Kerney SQUIBB, MD Admit Date: 05/13/2024  Reason for Consultation/Follow-up: Establishing goals of care  Subjective: Elderly lady resting in bed  Awake but in distress, moans and winces when repositioned.  Discussed with daughter and son in law outside the room .    I followed up with family regarding continued comfort measures, end of life care and awaiting residential hospice placement, [facility]   Patient required 5 doses of 25 mcg each of IV Fentanyl  in the past 24 hours.     Length of Stay: 8  Current Medications: Scheduled Meds:   DULoxetine   60 mg Oral Daily   enoxaparin  (LOVENOX ) injection  40 mg Subcutaneous Q24H   liver oil-zinc  oxide   Topical BID   mirtazapine   15 mg Oral QHS   OLANZapine  zydis  5 mg Oral Daily   pantoprazole   40 mg Oral Daily   propranolol   40 mg Oral BID   senna-docusate  1 tablet Oral BID   sorbitol   30 mL Oral Once   tamsulosin   0.4 mg Oral QPC supper    Continuous Infusions:   PRN Meds: albuterol , fentaNYL  (SUBLIMAZE ) injection, ibuprofen , lip balm, LORazepam , methocarbamol  (ROBAXIN ) injection, ondansetron  **OR** ondansetron  (ZOFRAN ) IV, mouth rinse, traZODone   Physical Exam         Frail appearing lady resting in bed.   generalized weakness Has muscle wasting  Moans and winces upon turning/repositioning.  Shallow diminished breath sounds.   Vital Signs: BP (!) 143/63 (BP Location: Right Arm)   Pulse 69   Temp 99.4 F (37.4 C) (Oral)   Resp 19   Ht 5' 3 (1.6 m)   Wt 53.4 kg   SpO2 98%   BMI 20.85 kg/m  SpO2: SpO2: 98 % O2 Device: O2 Device:  Room Air O2 Flow Rate: O2 Flow Rate (L/min): 2 L/min  Intake/output summary:  Intake/Output Summary (Last 24 hours) at 05/22/2024 1129 Last data filed at 05/21/2024 2150 Gross per 24 hour  Intake 5 ml  Output --  Net 5 ml   LBM: Last BM Date : 05/21/24 Baseline Weight: Weight: 53.4 kg Most recent weight:  Weight: 53.4 kg       Palliative Assessment/Data:      Patient Active Problem List   Diagnosis Date Noted   Palliative care by specialist 05/20/2024   Dying care 05/20/2024   Coordination of complex care 05/20/2024   Dementia with behavioral disturbance (HCC) 05/14/2024   Sundowning 05/14/2024   Gastroesophageal reflux disease 05/14/2024   Fall 05/14/2024   Pneumothorax on left 05/13/2024   Protein-calorie malnutrition, severe 04/17/2023   Weakness 04/12/2023   Intractable nausea and vomiting 10/01/2022   Hypokalemia 10/01/2022   Age-related osteoporosis without current pathological fracture 09/30/2021   Anxiety disorder 09/30/2021   Dysphagia 09/30/2021   Essential hypertension 09/30/2021   Pure hypercholesterolemia 09/30/2021   Hyperglycemia 09/30/2021   Atherosclerosis of abdominal aorta 09/30/2021   Cancer of left female breast (HCC) 09/30/2020   Hx breast cancer, IDC right LIQ,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 Remains admitted under Hospital Medicine. Palliative Medicine following for goals-of-care support and care coordination.  Assessment and Recommendations/Plan: Advanced dementia and profound, irreversible functional and cognitive decline, now with minimal oral intake and clinical features consistent with late-stage dementia and active dying. Overall trajectory  reflects end-of-life physiology rather than an acute reversible process. Prognosis is few days. Goals of care were revisited with family today in the context of expected decline from dementia progression; consensus remains DNR with comfort measures only. Plan is to transition to residential hospice when a bed becomes available.   Continue comfort-focused symptom management only. Utilize repositioning and gentle oral/airway care for terminal secretions; anticholinergic agents may be used if secretions are distressing. Administer opioids as needed for dyspnea or discomfort. Provide non-pharmacologic comfort measures including cool compresses, fan use, and minimal stimulation. Eye care with moisture as needed. No burdensome interventions indicated. Palliative team to continue close follow-up and support for patient and family during this end-of-life phase.   Goals of Care and Additional Recommendations: Limitations on Scope of Treatment: No Artificial Feeding  Code Status:    Code Status Orders  (From admission, onward)           Start     Ordered   05/13/24 1827  Do not attempt resuscitation (DNR)- Limited -Do Not Intubate (DNI)  Continuous       Question Answer Comment  If pulseless and not breathing No CPR or chest compressions.   In Pre-Arrest Conditions (Patient Is Breathing and Has A Pulse) Do not intubate. Provide all appropriate non-invasive medical interventions. Avoid ICU transfer unless indicated or required.   Consent: Discussion documented in EHR or advanced directives reviewed      05/13/24 1829           Code Status History     Date Active Date Inactive Code Status Order ID Comments User Context   04/12/2023 2113 04/20/2023 2316 Full Code [id number]  Dena Charleston, MD ED   10/01/2022 1444 10/02/2022 2214 Full Code [id number]  Seena Marsa NOVAK, MD ED   09/30/2020 1651 10/02/2020 2045 Full Code [id number]  Curvin Deward MOULD, MD Inpatient      Advance Directive  Documentation    Flowsheet Row Most Recent Value  Type of Advance Directive Out of facility DNR (pink MOST or yellow form)  Pre-existing  out of facility DNR order (yellow form or pink MOST form) --  MOST Form in Place? --    Prognosis:  Less than 2 weeks.   Discharge Planning: Hospice facility  Care plan was discussed with  patient,daughter and son in law     I personally spent a total of 35 minutes in the care of the patient today including preparing to see the patient, getting/reviewing separately obtained history, performing a medically appropriate exam/evaluation, counseling and educating, placing orders, referring and communicating with other health care professionals, documenting clinical information in the EHR, and coordinating care.   Thank you for allowing the Palliative Medicine Team to assist in the care of this patient.      Greater than 50%  of this time was spent counseling and coordinating care related to the above assessment and plan.  Lonia Serve, MD  Please contact Palliative Medicine Team phone at [phone] for questions and concerns.       "

## 2024-05-22 NOTE — Discharge Summary (Signed)
 Physician Discharge Summary  Nancy Bell FMW:[id number] DOB: 10-19-1924 DOA: 05/13/2024  PCP: Pia Kerney SQUIBB, MD  Admit date: 05/13/2024 Discharge date: 05/22/2024 Recommendations for Outpatient Follow-up:  Follow up with PCP in 1 weeks-call for appointment Please obtain BMP/CBC in one week  Discharge Dispo: [facility]. Discharge Condition: Stable Code Status:   Code Status: Do not attempt resuscitation (DNR) - Comfort care Diet recommendation:  Diet Order             Diet regular Room service appropriate? Yes; Fluid consistency: Thin  Diet effective now                    Brief/Interim Summary: Nancy Bell is a 89 y.o. female with PMH of advanced dementia, hypertension and anxiety, resident of memory care, who has recently been more combative and difficult to control and was found on the ground after the bed without obvious injury. She was brought to the ED for unclear reason and later complained of left-sided chest shoulder discomfort and workup showed large left pneumothor  s/p chest tube has been placed and hospitalist admission was requested on 05/13/24.  PCCM consulted for chest tube management> subsequently discontinued 1/5 PT OT working closely recommending SNF,difficult to obtain.PMT evaluated and discussed w/ family and decided on hospice at [facility].   Subjective: Seen and examined  Son-in-law at the bedside.  She just ate some food.  Slept well last night, no complaint OVERNIGHT ON PRN fentanyl  Robaxin  and Ativan , continue on scheduled meds  Discharge Diagnoses:   Pneumothorax on left, traumatic Recent fall at memory care: workup showed large left pneumothor  s/p chest tube has been placed and hospitalist admission was requested on 05/13/24.PCCM consulted for chest tube management> subsequently discontinued 1/5 . Doing well respiratory wise. Encourage incentive spirometry.  Continue pain management as per palliative care   Advanced dementia with  agitation At fall risk-with recent fall at facility unwitnessed Memory care resident Anxiety disorder on chronic benzo: Patient has been increasingly more agitated recently it seems.UA negative.  No obvious infection.  CT head CT C-spine no acute finding PTA on Cymbalta , Ativan  0.5 twice daily Remeron  15 bedtime, trazodone  100 bedtime,  Continue current medication for agitation along with Zyprexa , dose.Does get agitation ~ 2 PM  Palliative has been consulted plan for Hospice at [facility]  Minimally displaced fracture of the distal left clavicle: Cont brace  Essential hypertension: BP remains stable on propranolol .  GERD: Continue PPI  Mobility: PT Orders: Active PT Follow up Rec: Recommend Snf, Barriers To Snf Placement - Toc To F/U With Patient/Family For D/C Plans1/11/2024 1532   DVT prophylaxis: enoxaparin  (LOVENOX ) injection 40 mg Start: 05/13/24 2200 Code Status:   Code Status: Do not attempt resuscitation (DNR) - Comfort care Family Communication: plan of care discussed with patient/ family at bedside updated. Patient status is: Remains hospitalized because of severity of illness Level of care: Progressive   Dispo: The patient is from: memory care            Anticipated disposition: Awaiting for bed at [facility]  Objective: Vitals last 24 hrs: Vitals:   05/21/24 1447 05/21/24 2139 05/21/24 2140 05/22/24 0504  BP: 138/68 (!) 144/74 (!) 144/74 (!) 143/63  Pulse: 65 66 66 69  Resp: 16  20 19   Temp: 98.2 F (36.8 C)  (!) 97.5 F (36.4 C) 99.4 F (37.4 C)  TempSrc:   Oral Oral  SpO2: 93%  94% 98%  Weight:  Height:       Physical Examination: General exam: Eyes open, nonconversant not following commands HEENT:Oral mucosa moist, Ear/Nose WNL grossly Respiratory system: Bilaterally diminished  Cardiovascular system: S1 & S2 +, No JVD. Gastrointestinal system: Abdomen soft,NT,ND, BS+ Nervous System: Eyes open, not following commands Extremities: In fetal position   Skin: Warm, no rashes MSK: Normal muscle bulk,tone, power     Consultation: See note.  Discharge Instructions  Discharge Instructions     Discharge instructions   Complete by: As directed    Please call call MD or return to ER for similar or worsening recurring problem that brought you to hospital or if any fever,nausea/vomiting,abdominal pain, uncontrolled pain, chest pain,  shortness of breath or any other alarming symptoms.  Please follow-up your doctor as instructed in a week time and call the office for appointment.  Please avoid alcohol, smoking, or any other illicit substance and maintain healthy habits including taking your regular medications as prescribed.  You were cared for by a hospitalist during your hospital stay. If you have any questions about your discharge medications or the care you received while you were in the hospital after you are discharged, you can call the unit and ask to speak with the hospitalist on call if the hospitalist that took care of you is not available.  Once you are discharged, your primary care physician will handle any further medical issues. Please note that NO REFILLS for any discharge medications will be authorized once you are discharged, as it is imperative that you return to your primary care physician (or establish a relationship with a primary care physician if you do not have one) for your aftercare needs so that they can reassess your need for medications and monitor your lab values   Increase activity slowly   Complete by: As directed    No wound care   Complete by: As directed       Allergies as of 05/22/2024       Reactions   Augmentin [amoxicillin-pot Clavulanate] Other (See Comments)   Develops C Diff   Vicodin [hydrocodone-acetaminophen ] Other (See Comments)   Insomnia    Hydrocodone    Oxycontin  [oxycodone  Hcl] Other (See Comments)   Unknown reaction   Tylenol  [acetaminophen ] Nausea And Vomiting   Chills    Ultram   [tramadol  Hcl] Other (See Comments)   Unknown reaction        Medication List     TAKE these medications    ALPRAZolam  0.25 MG tablet Commonly known as: XANAX  Take 0.125 mg by mouth as needed for anxiety.   diphenhydrAMINE 25 MG tablet Commonly known as: BENADRYL Take 25 mg by mouth every 8 (eight) hours as needed for allergies or itching.   DULoxetine  30 MG capsule Commonly known as: CYMBALTA  Take 60 mg by mouth daily.   Ensure Take 237 mLs by mouth as needed.   loperamide  2 MG capsule Commonly known as: IMODIUM  Take 2 mg by mouth every 8 (eight) hours as needed for diarrhea or loose stools.   LORazepam  0.5 MG tablet Commonly known as: ATIVAN  Take 0.5 mg by mouth 2 (two) times daily.   mirtazapine  15 MG disintegrating tablet Commonly known as: REMERON  SOL-TAB Take 15 mg by mouth at bedtime.   OLANZapine  zydis 5 MG disintegrating tablet Commonly known as: ZYPREXA  Take 1 tablet (5 mg total) by mouth daily.   omeprazole 20 MG capsule Commonly known as: PRILOSEC Take 20 mg by mouth daily.   ondansetron  4 MG  disintegrating tablet Commonly known as: ZOFRAN -ODT Take 4 mg by mouth every 8 (eight) hours as needed for nausea or vomiting.   propranolol  40 MG tablet Commonly known as: INDERAL  Take 40 mg by mouth 2 (two) times daily.   senna-docusate 8.6-50 MG tablet Commonly known as: Senokot-S Take 1 tablet by mouth 2 (two) times daily.   tamsulosin  0.4 MG Caps capsule Commonly known as: FLOMAX  Take 0.4 mg by mouth at bedtime.   traZODone  100 MG tablet Commonly known as: DESYREL  Take 100 mg by mouth at bedtime.               Durable Medical Equipment  (From admission, onward)           Start     Ordered   05/19/24 1610  For home use only DME Hospital bed  Once       Question Answer Comment  Length of Need Lifetime   Bed type Semi-electric      05/19/24 1609            Allergies[1]  The results of significant diagnostics from this  hospitalization (including imaging, microbiology, ancillary and laboratory) are listed below for reference.    Microbiology: Recent Results (from the past 240 hours)  Urine Culture (for pregnant, neutropenic or urologic patients or patients with an indwelling urinary catheter)     Status: None   Collection Time: 05/14/24  1:55 PM   Specimen: Urine, Catheterized  Result Value Ref Range Status   Specimen Description   Final    URINE, CATHETERIZED Performed at [facility], [address] [place], [place] [address]    Special Requests   Final    NONE Performed at [facility], [address], [place], [place] [address]    Culture   Final    NO GROWTH Performed at [facility], [address], [place], [place] [address]    Report Status 05/16/2024 FINAL  Final    Procedures/Studies: DG CHEST PORT 1 VIEW Result Date: 05/16/2024 EXAM: 1 VIEW(S) XRAY OF THE CHEST 05/16/2024 12:27:00 PM COMPARISON: 05/15/2024. CLINICAL HISTORY: [id number] Pneumothorax on left [id number] Pneumothorax on left FINDINGS: LUNGS AND PLEURA: Left lower lobe atelectasis or infiltrate with small left pleural effusion. Right lung clear. No pneumothorax. HEART AND MEDIASTINUM: Large hiatal hernia. Aortic atherosclerosis. No acute abnormality of the cardiac silhouette. BONES AND SOFT TISSUES: No acute osseous abnormality. IMPRESSION: 1. Left lower lobe atelectasis or infiltrate with small left pleural effusion. 2. Large hiatal hernia. Electronically signed by: Franky Crease MD 05/16/2024 08:32 PM EST RP Workstation: [id number]   DG Chest Port 1 View Result Date: 05/15/2024 CLINICAL DATA:  Follow-up left pneumothorax EXAM: PORTABLE CHEST 1 VIEW COMPARISON:  Earlier today. FINDINGS: Left pleural catheter remains in place with an interval small amount of adjacent linear density and small localized left lateral pneumothorax at the location of catheter entry. Mildly enlarged cardiac silhouette.  Tortuous and partially calcified thoracic aorta. Mild decrease in left basilar atelectasis with persistent minimal bilateral pleural fluid. Thoracic spine degenerative changes. IMPRESSION: 1. Small left lateral pneumothorax at the location of catheter entry. 2. Mild decrease in left basilar atelectasis. 3. Persistent minimal bilateral pleural fluid. Electronically Signed   By: Elspeth Bathe M.D.   On: 05/15/2024 17:43   DG Chest Port 1 View Result Date: 05/15/2024 CLINICAL DATA:  Follow-up left pneumothorax. EXAM: PORTABLE CHEST 1 VIEW COMPARISON:  05/14/2024 FINDINGS: A left pleural catheter remains in place with no  pneumothorax seen. Stable dense left lower lobe consolidation with little change in adjacent atelectasis. Minimal bilateral pleural effusions. Normal-sized heart. Tortuous and partially calcified thoracic aorta. Thoracic spine degenerative changes. IMPRESSION: 1. No pneumothorax. 2. Stable dense left lower lobe atelectasis or pneumonia with little change in adjacent atelectasis. 3. Minimal bilateral pleural effusions. Electronically Signed   By: Elspeth Bathe M.D.   On: 05/15/2024 17:40   DG CHEST PORT 1 VIEW Result Date: 05/14/2024 EXAM: 1 VIEW(S) XRAY OF THE CHEST 05/14/2024 07:42:00 PM COMPARISON: 05/14/2024 CLINICAL HISTORY: [id number] Pneumothorax on left [id number] Pneumothorax on left FINDINGS: LINES, TUBES AND DEVICES: Left pigtail chest tube remains in place, unchanged. LUNGS AND PLEURA: Left base atelectasis. Small left pleural effusion. No definite residual pneumothorax. HEART AND MEDIASTINUM: Aortic atherosclerosis. No acute abnormality of the cardiac and mediastinal silhouettes. BONES AND SOFT TISSUES: No acute osseous abnormality. IMPRESSION: 1. Left pigtail chest tube remains in place, unchanged. No definite residual pneumothorax. 2. Left base atelectasis and small left pleural effusion. Electronically signed by: Franky Crease MD 05/14/2024 07:57 PM EST RP Workstation: [id number]   DG Chest  Port 1 View Result Date: 05/14/2024 EXAM: 1 VIEW(S) XRAY OF THE CHEST 05/14/2024 07:16:00 AM COMPARISON: 05/13/2024 CLINICAL HISTORY: Pneumothorax on left. FINDINGS: LINES, TUBES AND DEVICES: Left pigtail thoracostomy tube stable in position. LUNGS AND PLEURA: Trace residual left apical pneumothorax, unchanged. Small left pleural effusion and retrocardiac opacity, stable. HEART AND MEDIASTINUM: Mild cardiomegaly. Aortic arch atherosclerosis. BONES AND SOFT TISSUES: Distal left clavicle fracture. IMPRESSION: 1. Trace residual left apical pneumothorax, unchanged. 2. Small left pleural effusion and retrocardiac opacity, stable. 3. Left pigtail thoracostomy tube stable in position. 4. Distal left clavicle fracture. 5. Mild cardiomegaly and aortic arch atherosclerosis. Electronically signed by: Evalene Coho MD 05/14/2024 07:43 AM EST RP Workstation: [id number]   DG Chest Port 1 View Result Date: 05/13/2024 EXAM: 1 VIEW(S) XRAY OF THE CHEST 05/13/2024 06:23:00 PM COMPARISON: Comparison with 05/13/2024. CLINICAL HISTORY: new pain and low O2 sat after transfer post pigtail FINDINGS: LINES, TUBES AND DEVICES: A left chest tube is in place. LUNGS AND PLEURA: Shallow inspiration. Suggestion of a small residual left apical pneumothorax. Small left pleural effusion. Atelectasis in the lung bases. HEART AND MEDIASTINUM: Heart size is normal. Calcification of the aorta. BONES AND SOFT TISSUES: Minimally displaced fracture of the distal left clavicle, stable since prior study. Degenerative changes in the spine. Mild subcutaneous emphysema in the left chest wall. Surgical clips in the left axilla. IMPRESSION: 1. Suggestion of a small residual left apical pneumothorax, no change since prior study. 2. Small left pleural effusion, no change since prior study. 3. Bibasilar atelectasis. 4. Mild subcutaneous emphysema in the left chest wall. 5. Minimally displaced fracture of the distal left clavicle, no change since prior study.  Electronically signed by: Elsie Gravely MD 05/13/2024 06:31 PM EST RP Workstation: [id number]   CT Cervical Spine Wo Contrast Result Date: 05/13/2024 CLINICAL DATA:  Fall EXAM: CT CERVICAL SPINE WITHOUT CONTRAST TECHNIQUE: Multidetector CT imaging of the cervical spine was performed without intravenous contrast. Multiplanar CT image reconstructions were also generated. RADIATION DOSE REDUCTION: This exam was performed according to the departmental dose-optimization program which includes automated exposure control, adjustment of the mA and/or kV according to patient size and/or use of iterative reconstruction technique. COMPARISON:  Chest x-ray 05/13/2024, CT 08/14/2023 FINDINGS: Alignment: Motion degraded examination, facet alignment grossly maintained. Straightening of the cervical spine. Skull base and vertebrae: Limited fracture assessment due to significant motion degradation. Vertebral body heights are  grossly normal Soft tissues and spinal canal: No prevertebral fluid or swelling. No visible canal hematoma. Disc levels: Moderate disc space narrowing C5-C6 and C6-C7. Multilevel hypertrophic facet degenerative changes. Upper chest: Left apical pneumothorax Other: None IMPRESSION: 1. Significantly motion degraded examination limiting assessment for fracture. 2. Left apical pneumothorax, see chest x-ray 05/13/2024 Electronically Signed   By: Luke Bun M.D.   On: 05/13/2024 18:14   CT Head Wo Contrast Result Date: 05/13/2024 CLINICAL DATA:  Head trauma EXAM: CT HEAD WITHOUT CONTRAST TECHNIQUE: Contiguous axial images were obtained from the base of the skull through the vertex without intravenous contrast. RADIATION DOSE REDUCTION: This exam was performed according to the departmental dose-optimization program which includes automated exposure control, adjustment of the mA and/or kV according to patient size and/or use of iterative reconstruction technique. COMPARISON:  MRI 10/11/2023, CT brain 10/11/2023  FINDINGS: Brain: No acute territorial infarction, hemorrhage or intracranial mass. Atrophy and chronic small vessel ischemic changes of the white matter. Stable ventricle size. Vascular: No hyperdense vessels.  No unexpected calcification Skull: Normal. Negative for fracture or focal lesion. Sinuses/Orbits: No acute finding. Other: None IMPRESSION: 1. No CT evidence for acute intracranial abnormality. 2. Atrophy and chronic small vessel ischemic changes of the white matter. Electronically Signed   By: Luke Bun M.D.   On: 05/13/2024 18:09   DG Chest Portable 1 View Result Date: 05/13/2024 CLINICAL DATA:  Status post chest tube placement EXAM: PORTABLE CHEST 1 VIEW COMPARISON:  Same day FINDINGS: Interval placement of left-sided chest tube. Small left apical pneumothorax is noted which is significantly smaller compared to prior exam. Minimal subcutaneous emphysema is seen over left lateral chest wall. IMPRESSION: Interval placement of left-sided chest tube. Small left apical pneumothorax is noted which is significantly smaller compared to prior exam. Electronically Signed   By: Lynwood Landy Raddle M.D.   On: 05/13/2024 17:22   DG Knee Complete 4 Views Left Result Date: 05/13/2024 CLINICAL DATA:  Left knee pain after fall yesterday EXAM: LEFT KNEE - COMPLETE 4+ VIEW COMPARISON:  None Available. FINDINGS: No evidence of fracture, dislocation, or joint effusion. No evidence of arthropathy or other focal bone abnormality. Soft tissues are unremarkable. IMPRESSION: Negative. Electronically Signed   By: Lynwood Landy Raddle M.D.   On: 05/13/2024 14:06   DG Pelvis 1-2 Views Result Date: 05/13/2024 CLINICAL DATA:  Pelvic pain after fall yesterday. EXAM: PELVIS - 1-2 VIEW COMPARISON:  July 18, 2023 FINDINGS: There is no evidence of pelvic fracture or diastasis. No pelvic bone lesions are seen. Calcified uterine fibroid is again noted. IMPRESSION: No acute abnormality seen. Electronically Signed   By: Lynwood Landy Raddle M.D.    On: 05/13/2024 14:05   DG Chest Portable 1 View Result Date: 05/13/2024 CLINICAL DATA:  Chest pain after fall yesterday. EXAM: PORTABLE CHEST 1 VIEW COMPARISON:  October 11, 2023 FINDINGS: Interval development of large left pneumothorax. Stable cardiomediastinal silhouette. Right lung is clear. Bony thorax is unremarkable. IMPRESSION: Interval development of large left pneumothorax. Critical Value/emergent results were called by telephone at the time of interpretation on 05/13/2024 at 2:03 pm to provider JAYSON PEREYRA , who verbally acknowledged these results. Electronically Signed   By: Lynwood Landy Raddle M.D.   On: 05/13/2024 14:03    Labs: BNP (last 3 results) No results for input(s): BNP in the last 8760 hours. Basic Metabolic Panel: Recent Labs  Lab 05/16/24 0428 05/17/24 0423  NA 141 140  K 4.1 3.7  CL 110 109  CO2 19* 22  GLUCOSE 91 114*  BUN 17 16  CREATININE 0.68 0.64  CALCIUM 8.3* 8.1*  MG  --  1.8   Recent Labs  Lab 05/16/24 0428 05/17/24 0423  WBC 9.1 11.6*  HGB 12.4 11.3*  HCT 36.6 33.7*  MCV 87.6 87.1  PLT 146* 187  Urinalysis    Component Value Date/Time   COLORURINE AMBER (A) 05/14/2024 1355   APPEARANCEUR HAZY (A) 05/14/2024 1355   LABSPEC 1.029 05/14/2024 1355   PHURINE 5.0 05/14/2024 1355   GLUCOSEU NEGATIVE 05/14/2024 1355   HGBUR NEGATIVE 05/14/2024 1355   BILIRUBINUR NEGATIVE 05/14/2024 1355   KETONESUR 5 (A) 05/14/2024 1355   PROTEINUR NEGATIVE 05/14/2024 1355   UROBILINOGEN 0.2 01/27/2010 1015   NITRITE NEGATIVE 05/14/2024 1355   LEUKOCYTESUR NEGATIVE 05/14/2024 1355   Sepsis Labs Recent Labs  Lab 05/16/24 0428 05/17/24 0423  WBC 9.1 11.6*   Microbiology Recent Results (from the past 240 hours)  Urine Culture (for pregnant, neutropenic or urologic patients or patients with an indwelling urinary catheter)     Status: None   Collection Time: 05/14/24  1:55 PM   Specimen: Urine, Catheterized  Result Value Ref Range Status   Specimen Description    Final    URINE, CATHETERIZED Performed at [facility], [address] [place], [place] [address]    Special Requests   Final    NONE Performed at [facility], [address] [place], [place] [address]    Culture   Final    NO GROWTH Performed at [facility], [address], [place], [place] [address]    Report Status 05/16/2024 FINAL  Final   Time coordinating discharge:  35 minutes  SIGNED: Mennie LAMY, MD  [facility] 05/22/2024, 3:50 PM  If 7PM-7AM, please contact night-coverage [email or web address]       [1]  Allergies Allergen Reactions   Augmentin [Amoxicillin-Pot Clavulanate] Other (See Comments)    Develops C Diff   Vicodin [Hydrocodone-Acetaminophen ] Other (See Comments)    Insomnia    Hydrocodone    Oxycontin  [Oxycodone  Hcl] Other (See Comments)    Unknown reaction   Tylenol  [Acetaminophen ] Nausea And Vomiting    Chills    Ultram  [Tramadol  Hcl] Other (See Comments)    Unknown reaction

## 2024-05-22 NOTE — Progress Notes (Signed)
 " PROGRESS NOTE Nancy Bell  FMW:[id number] DOB: 1924-06-06 DOA: 05/13/2024 PCP: Pia Kerney SQUIBB, MD  Brief Narrative/Hospital Course: Nancy Bell is a 89 y.o. female with PMH of advanced dementia, hypertension and anxiety, resident of memory care, who has recently been more combative and difficult to control and was found on the ground after the bed without obvious injury. She was brought to the ED for unclear reason and later complained of left-sided chest shoulder discomfort and workup showed large left pneumothor  s/p chest tube has been placed and hospitalist admission was requested on 05/13/24.  PCCM consulted for chest tube management> subsequently discontinued 1/5 PT OT working closely recommending SNF, difficult to obtain. At this time daughter requesting for her to return to [facility].  Subjective: Seen and examined  Son-in-law at the bedside.  She just ate some food.  Slept well last night, no complaint OVERNIGHT ON PRN fentanyl  Robaxin  and Ativan , continue on scheduled meds  Assessment and plan:  Pneumothorax on left, traumatic Recent fall at memory care: workup showed large left pneumothor  s/p chest tube has been placed and hospitalist admission was requested on 05/13/24.PCCM consulted for chest tube management> subsequently discontinued 1/5 . Doing well respiratory wise. Encourage incentive spirometry.  Continue pain management as per palliative care   Advanced dementia with agitation At fall risk-with recent fall at facility unwitnessed Memory care resident Anxiety disorder on chronic benzo: Patient has been increasingly more agitated recently it seems.UA negative.  No obvious infection.  CT head CT C-spine no acute finding PTA on Cymbalta , Ativan  0.5 twice daily Remeron  15 bedtime, trazodone  100 bedtime,  Continue current medication for agitation along with Zyprexa , dose.Does get agitation ~ 2 PM  Palliative has been consulted plan for Hospice  Minimally  displaced fracture of the distal left clavicle: Cont brace  Essential hypertension: BP remains stable on propranolol .  GERD: Continue PPI  Mobility: PT Orders: Active PT Follow up Rec: Recommend Snf, Barriers To Snf Placement - Toc To F/U With Patient/Family For D/C Plans1/11/2024 1532   DVT prophylaxis: enoxaparin  (LOVENOX ) injection 40 mg Start: 05/13/24 2200 Code Status:   Code Status: Do not attempt resuscitation (DNR) - Comfort care Family Communication: plan of care discussed with patient/ family at bedside updated. Patient status is: Remains hospitalized because of severity of illness Level of care: Progressive   Dispo: The patient is from: memory care            Anticipated disposition: Awaiting for bed at [facility]  Objective: Vitals last 24 hrs: Vitals:   05/21/24 1447 05/21/24 2139 05/21/24 2140 05/22/24 0504  BP: 138/68 (!) 144/74 (!) 144/74 (!) 143/63  Pulse: 65 66 66 69  Resp: 16  20 19   Temp: 98.2 F (36.8 C)  (!) 97.5 F (36.4 C) 99.4 F (37.4 C)  TempSrc:   Oral Oral  SpO2: 93%  94% 98%  Weight:      Height:       Physical Examination: General exam: Eyes open, nonconversant not following commands HEENT:Oral mucosa moist, Ear/Nose WNL grossly Respiratory system: Bilaterally diminished  Cardiovascular system: S1 & S2 +, No JVD. Gastrointestinal system: Abdomen soft,NT,ND, BS+ Nervous System: Eyes open, not following commands Extremities: In fetal position  Skin: Warm, no rashes MSK: Normal muscle bulk,tone, power  Data Reviewed: I have personally reviewed following labs and imaging studies ( see epic result tab) CBC: Recent Labs  Lab 05/16/24 0428 05/17/24 0423  WBC 9.1 11.6*  HGB 12.4  11.3*  HCT 36.6 33.7*  MCV 87.6 87.1  PLT 146* 187   CMP: Recent Labs  Lab 05/16/24 0428 05/17/24 0423  NA 141 140  K 4.1 3.7  CL 110 109  CO2 19* 22  GLUCOSE 91 114*  BUN 17 16  CREATININE 0.68 0.64  CALCIUM 8.3* 8.1*  MG  --  1.8   GFR:  Estimated Creatinine Clearance: 33.3 mL/min (by C-G formula based on SCr of 0.64 mg/dL). No results for input(s): AST, ALT, ALKPHOS, BILITOT, PROT, ALBUMIN in the last 168 hours. No results for input(s): LIPASE, AMYLASE in the last 168 hours. No results for input(s): AMMONIA in the last 168 hours. Coagulation Profile: No results for input(s): INR, PROTIME in the last 168 hours. Unresulted Labs (From admission, onward)    None      Antimicrobials/Microbiology: Anti-infectives (From admission, onward)    None         Component Value Date/Time   SDES  05/14/2024 1355    URINE, CATHETERIZED Performed at [facility], [address], [place], [place] [address]    SPECREQUEST  05/14/2024 1355    NONE Performed at [facility], [address] [place], [place] [address]    CULT  05/14/2024 1355    NO GROWTH Performed at [facility], [address] [place], [place] [address]    REPTSTATUS 05/16/2024 FINAL 05/14/2024 1355  Procedures:  Mennie LAMY, MD [facility] 05/22/2024, 10:58 AM   "

## 2024-05-22 NOTE — Progress Notes (Signed)
 Called [facility] room 11 nurse Meredith at [phone] and gave report.

## 2024-06-11 DEATH — deceased
